# Patient Record
Sex: Female | Born: 1965 | Race: White | Hispanic: No | Smoking: Former smoker
Health system: Southern US, Community
[De-identification: ages and names within clinical notes are randomized; demographics above are authoritative.]

## PROBLEM LIST (undated history)

## (undated) DIAGNOSIS — F32A Depression, unspecified: Secondary | ICD-10-CM

## (undated) DIAGNOSIS — R519 Headache, unspecified: Secondary | ICD-10-CM

## (undated) DIAGNOSIS — R51 Headache: Secondary | ICD-10-CM

## (undated) DIAGNOSIS — K589 Irritable bowel syndrome without diarrhea: Secondary | ICD-10-CM

## (undated) DIAGNOSIS — E7800 Pure hypercholesterolemia, unspecified: Secondary | ICD-10-CM

## (undated) DIAGNOSIS — M1990 Unspecified osteoarthritis, unspecified site: Secondary | ICD-10-CM

## (undated) DIAGNOSIS — E039 Hypothyroidism, unspecified: Secondary | ICD-10-CM

## (undated) DIAGNOSIS — G8929 Other chronic pain: Secondary | ICD-10-CM

## (undated) DIAGNOSIS — E079 Disorder of thyroid, unspecified: Secondary | ICD-10-CM

## (undated) DIAGNOSIS — F419 Anxiety disorder, unspecified: Secondary | ICD-10-CM

## (undated) DIAGNOSIS — J45909 Unspecified asthma, uncomplicated: Secondary | ICD-10-CM

## (undated) DIAGNOSIS — J449 Chronic obstructive pulmonary disease, unspecified: Secondary | ICD-10-CM

## (undated) DIAGNOSIS — K760 Fatty (change of) liver, not elsewhere classified: Secondary | ICD-10-CM

## (undated) DIAGNOSIS — J439 Emphysema, unspecified: Secondary | ICD-10-CM

## (undated) DIAGNOSIS — G2581 Restless legs syndrome: Secondary | ICD-10-CM

## (undated) DIAGNOSIS — F329 Major depressive disorder, single episode, unspecified: Secondary | ICD-10-CM

## (undated) DIAGNOSIS — I10 Essential (primary) hypertension: Secondary | ICD-10-CM

## (undated) DIAGNOSIS — K219 Gastro-esophageal reflux disease without esophagitis: Secondary | ICD-10-CM

## (undated) DIAGNOSIS — M2550 Pain in unspecified joint: Secondary | ICD-10-CM

## (undated) DIAGNOSIS — M549 Dorsalgia, unspecified: Secondary | ICD-10-CM

## (undated) HISTORY — DX: Dorsalgia, unspecified: M54.9

## (undated) HISTORY — PX: APPENDECTOMY: SHX54

## (undated) HISTORY — DX: Headache: R51

## (undated) HISTORY — DX: Irritable bowel syndrome, unspecified: K58.9

## (undated) HISTORY — DX: Emphysema, unspecified: J43.9

## (undated) HISTORY — DX: Essential (primary) hypertension: I10

## (undated) HISTORY — DX: Other chronic pain: G89.29

## (undated) HISTORY — PX: BACK SURGERY: SHX140

## (undated) HISTORY — DX: Pain in unspecified joint: M25.50

## (undated) HISTORY — DX: Depression, unspecified: F32.A

## (undated) HISTORY — DX: Restless legs syndrome: G25.81

## (undated) HISTORY — DX: Chronic obstructive pulmonary disease, unspecified: J44.9

## (undated) HISTORY — DX: Disorder of thyroid, unspecified: E07.9

## (undated) HISTORY — PX: SHOULDER SURGERY: SHX246

## (undated) HISTORY — DX: Pure hypercholesterolemia, unspecified: E78.00

## (undated) HISTORY — DX: Headache, unspecified: R51.9

## (undated) HISTORY — DX: Unspecified asthma, uncomplicated: J45.909

## (undated) HISTORY — DX: Major depressive disorder, single episode, unspecified: F32.9

## (undated) HISTORY — DX: Fatty (change of) liver, not elsewhere classified: K76.0

---

## 1998-05-18 ENCOUNTER — Encounter: Payer: Self-pay | Admitting: Thoracic Surgery

## 1998-05-20 ENCOUNTER — Ambulatory Visit (HOSPITAL_COMMUNITY): Admission: RE | Admit: 1998-05-20 | Discharge: 1998-05-20 | Payer: Self-pay | Admitting: Thoracic Surgery

## 1998-06-02 ENCOUNTER — Emergency Department (HOSPITAL_COMMUNITY): Admission: EM | Admit: 1998-06-02 | Discharge: 1998-06-02 | Payer: Self-pay | Admitting: Emergency Medicine

## 1999-01-17 ENCOUNTER — Encounter: Admission: RE | Admit: 1999-01-17 | Discharge: 1999-01-17 | Payer: Self-pay | Admitting: *Deleted

## 1999-01-19 ENCOUNTER — Encounter: Admission: RE | Admit: 1999-01-19 | Discharge: 1999-01-19 | Payer: Self-pay | Admitting: *Deleted

## 1999-02-07 ENCOUNTER — Encounter: Payer: Self-pay | Admitting: Internal Medicine

## 1999-02-07 ENCOUNTER — Ambulatory Visit (HOSPITAL_COMMUNITY): Admission: RE | Admit: 1999-02-07 | Discharge: 1999-02-07 | Payer: Self-pay | Admitting: Internal Medicine

## 2000-08-13 ENCOUNTER — Ambulatory Visit (HOSPITAL_COMMUNITY): Admission: RE | Admit: 2000-08-13 | Discharge: 2000-08-13 | Payer: Self-pay | Admitting: Family Medicine

## 2000-08-13 ENCOUNTER — Encounter: Payer: Self-pay | Admitting: Family Medicine

## 2000-08-16 ENCOUNTER — Encounter: Payer: Self-pay | Admitting: Gastroenterology

## 2000-08-16 ENCOUNTER — Ambulatory Visit (HOSPITAL_COMMUNITY): Admission: RE | Admit: 2000-08-16 | Discharge: 2000-08-16 | Payer: Self-pay | Admitting: Gastroenterology

## 2000-08-18 ENCOUNTER — Emergency Department (HOSPITAL_COMMUNITY): Admission: EM | Admit: 2000-08-18 | Discharge: 2000-08-18 | Payer: Self-pay | Admitting: Emergency Medicine

## 2000-08-18 ENCOUNTER — Encounter: Payer: Self-pay | Admitting: Emergency Medicine

## 2000-08-27 ENCOUNTER — Ambulatory Visit (HOSPITAL_COMMUNITY): Admission: RE | Admit: 2000-08-27 | Discharge: 2000-08-27 | Payer: Self-pay | Admitting: Gastroenterology

## 2000-08-28 ENCOUNTER — Encounter: Payer: Self-pay | Admitting: Gastroenterology

## 2000-08-28 ENCOUNTER — Ambulatory Visit (HOSPITAL_COMMUNITY): Admission: RE | Admit: 2000-08-28 | Discharge: 2000-08-28 | Payer: Self-pay | Admitting: Gastroenterology

## 2000-08-29 ENCOUNTER — Encounter: Payer: Self-pay | Admitting: Orthopedic Surgery

## 2000-08-29 ENCOUNTER — Encounter (INDEPENDENT_AMBULATORY_CARE_PROVIDER_SITE_OTHER): Payer: Self-pay | Admitting: Specialist

## 2000-08-30 ENCOUNTER — Inpatient Hospital Stay (HOSPITAL_COMMUNITY): Admission: RE | Admit: 2000-08-30 | Discharge: 2000-08-31 | Payer: Self-pay | Admitting: Orthopedic Surgery

## 2000-09-20 ENCOUNTER — Other Ambulatory Visit: Admission: RE | Admit: 2000-09-20 | Discharge: 2000-09-20 | Payer: Self-pay | Admitting: Obstetrics and Gynecology

## 2000-09-30 ENCOUNTER — Encounter: Payer: Self-pay | Admitting: Obstetrics and Gynecology

## 2000-09-30 ENCOUNTER — Ambulatory Visit (HOSPITAL_COMMUNITY): Admission: RE | Admit: 2000-09-30 | Discharge: 2000-09-30 | Payer: Self-pay | Admitting: Obstetrics and Gynecology

## 2001-01-08 ENCOUNTER — Encounter: Payer: Self-pay | Admitting: Orthopedic Surgery

## 2001-01-08 ENCOUNTER — Encounter (INDEPENDENT_AMBULATORY_CARE_PROVIDER_SITE_OTHER): Payer: Self-pay | Admitting: Specialist

## 2001-01-09 ENCOUNTER — Inpatient Hospital Stay (HOSPITAL_COMMUNITY): Admission: RE | Admit: 2001-01-09 | Discharge: 2001-01-10 | Payer: Self-pay | Admitting: Orthopedic Surgery

## 2001-06-13 ENCOUNTER — Encounter: Admission: RE | Admit: 2001-06-13 | Discharge: 2001-06-13 | Payer: Self-pay | Admitting: Internal Medicine

## 2001-08-27 ENCOUNTER — Encounter: Payer: Self-pay | Admitting: Orthopaedic Surgery

## 2001-08-27 ENCOUNTER — Inpatient Hospital Stay (HOSPITAL_COMMUNITY): Admission: RE | Admit: 2001-08-27 | Discharge: 2001-08-31 | Payer: Self-pay | Admitting: Orthopaedic Surgery

## 2001-09-10 ENCOUNTER — Emergency Department (HOSPITAL_COMMUNITY): Admission: EM | Admit: 2001-09-10 | Discharge: 2001-09-10 | Payer: Self-pay | Admitting: Emergency Medicine

## 2001-11-10 ENCOUNTER — Encounter: Admission: RE | Admit: 2001-11-10 | Discharge: 2001-12-04 | Payer: Self-pay | Admitting: Orthopaedic Surgery

## 2002-07-31 ENCOUNTER — Encounter: Admission: RE | Admit: 2002-07-31 | Discharge: 2002-07-31 | Payer: Self-pay | Admitting: Orthopaedic Surgery

## 2002-07-31 ENCOUNTER — Encounter: Payer: Self-pay | Admitting: Orthopaedic Surgery

## 2003-11-22 ENCOUNTER — Encounter: Admission: RE | Admit: 2003-11-22 | Discharge: 2003-11-22 | Payer: Self-pay | Admitting: Allergy and Immunology

## 2003-12-20 ENCOUNTER — Other Ambulatory Visit: Admission: RE | Admit: 2003-12-20 | Discharge: 2003-12-20 | Payer: Self-pay | Admitting: Obstetrics and Gynecology

## 2004-03-22 ENCOUNTER — Ambulatory Visit: Payer: Self-pay | Admitting: Internal Medicine

## 2004-04-24 ENCOUNTER — Ambulatory Visit: Payer: Self-pay | Admitting: Internal Medicine

## 2004-06-21 ENCOUNTER — Observation Stay (HOSPITAL_COMMUNITY): Admission: EM | Admit: 2004-06-21 | Discharge: 2004-06-22 | Payer: Self-pay | Admitting: Emergency Medicine

## 2004-06-22 ENCOUNTER — Encounter (INDEPENDENT_AMBULATORY_CARE_PROVIDER_SITE_OTHER): Payer: Self-pay | Admitting: *Deleted

## 2004-06-30 ENCOUNTER — Ambulatory Visit: Payer: Self-pay | Admitting: Internal Medicine

## 2004-07-26 ENCOUNTER — Ambulatory Visit: Payer: Self-pay | Admitting: Internal Medicine

## 2004-11-09 ENCOUNTER — Ambulatory Visit: Payer: Self-pay | Admitting: Internal Medicine

## 2004-11-30 ENCOUNTER — Ambulatory Visit: Payer: Self-pay | Admitting: Internal Medicine

## 2004-12-21 ENCOUNTER — Other Ambulatory Visit: Admission: RE | Admit: 2004-12-21 | Discharge: 2004-12-21 | Payer: Self-pay | Admitting: Obstetrics and Gynecology

## 2005-02-02 ENCOUNTER — Ambulatory Visit: Payer: Self-pay | Admitting: Internal Medicine

## 2005-08-06 ENCOUNTER — Ambulatory Visit: Payer: Self-pay | Admitting: Internal Medicine

## 2005-08-09 ENCOUNTER — Ambulatory Visit: Payer: Self-pay | Admitting: Internal Medicine

## 2005-08-12 ENCOUNTER — Emergency Department (HOSPITAL_COMMUNITY): Admission: EM | Admit: 2005-08-12 | Discharge: 2005-08-13 | Payer: Self-pay | Admitting: Emergency Medicine

## 2005-08-15 ENCOUNTER — Ambulatory Visit: Payer: Self-pay | Admitting: Internal Medicine

## 2005-08-30 ENCOUNTER — Ambulatory Visit: Payer: Self-pay | Admitting: Internal Medicine

## 2005-11-27 ENCOUNTER — Encounter (INDEPENDENT_AMBULATORY_CARE_PROVIDER_SITE_OTHER): Payer: Self-pay | Admitting: *Deleted

## 2005-11-27 ENCOUNTER — Inpatient Hospital Stay (HOSPITAL_COMMUNITY): Admission: EM | Admit: 2005-11-27 | Discharge: 2005-11-28 | Payer: Self-pay | Admitting: Emergency Medicine

## 2006-01-01 ENCOUNTER — Other Ambulatory Visit: Admission: RE | Admit: 2006-01-01 | Discharge: 2006-01-01 | Payer: Self-pay | Admitting: Obstetrics & Gynecology

## 2006-01-18 ENCOUNTER — Ambulatory Visit: Payer: Self-pay | Admitting: Internal Medicine

## 2006-04-10 ENCOUNTER — Ambulatory Visit: Payer: Self-pay | Admitting: Internal Medicine

## 2006-05-03 ENCOUNTER — Ambulatory Visit: Payer: Self-pay | Admitting: Internal Medicine

## 2006-05-03 LAB — CONVERTED CEMR LAB: TSH: 4.98 microintl units/mL (ref 0.35–5.50)

## 2006-10-04 ENCOUNTER — Ambulatory Visit: Payer: Self-pay | Admitting: Internal Medicine

## 2006-10-04 LAB — CONVERTED CEMR LAB: TSH: 6.06 microintl units/mL — ABNORMAL HIGH (ref 0.35–5.50)

## 2006-11-01 DIAGNOSIS — K219 Gastro-esophageal reflux disease without esophagitis: Secondary | ICD-10-CM | POA: Insufficient documentation

## 2006-11-01 DIAGNOSIS — M5137 Other intervertebral disc degeneration, lumbosacral region: Secondary | ICD-10-CM | POA: Insufficient documentation

## 2006-11-01 DIAGNOSIS — M67919 Unspecified disorder of synovium and tendon, unspecified shoulder: Secondary | ICD-10-CM | POA: Insufficient documentation

## 2006-11-01 DIAGNOSIS — M5126 Other intervertebral disc displacement, lumbar region: Secondary | ICD-10-CM | POA: Insufficient documentation

## 2006-11-01 DIAGNOSIS — M719 Bursopathy, unspecified: Secondary | ICD-10-CM

## 2006-11-01 DIAGNOSIS — D869 Sarcoidosis, unspecified: Secondary | ICD-10-CM | POA: Insufficient documentation

## 2006-11-01 DIAGNOSIS — M51379 Other intervertebral disc degeneration, lumbosacral region without mention of lumbar back pain or lower extremity pain: Secondary | ICD-10-CM | POA: Insufficient documentation

## 2006-11-01 DIAGNOSIS — E039 Hypothyroidism, unspecified: Secondary | ICD-10-CM | POA: Insufficient documentation

## 2007-01-15 ENCOUNTER — Encounter: Payer: Self-pay | Admitting: Internal Medicine

## 2007-01-15 ENCOUNTER — Other Ambulatory Visit: Admission: RE | Admit: 2007-01-15 | Discharge: 2007-01-15 | Payer: Self-pay | Admitting: Obstetrics and Gynecology

## 2007-01-18 ENCOUNTER — Emergency Department (HOSPITAL_COMMUNITY): Admission: EM | Admit: 2007-01-18 | Discharge: 2007-01-18 | Payer: Self-pay | Admitting: Emergency Medicine

## 2007-01-28 ENCOUNTER — Encounter: Payer: Self-pay | Admitting: Internal Medicine

## 2007-01-28 ENCOUNTER — Ambulatory Visit: Payer: Self-pay | Admitting: Internal Medicine

## 2007-01-31 ENCOUNTER — Telehealth: Payer: Self-pay | Admitting: Internal Medicine

## 2007-02-04 ENCOUNTER — Telehealth: Payer: Self-pay | Admitting: Internal Medicine

## 2007-04-09 ENCOUNTER — Ambulatory Visit: Payer: Self-pay | Admitting: Internal Medicine

## 2007-04-09 LAB — CONVERTED CEMR LAB: TSH: 1.71 microintl units/mL (ref 0.35–5.50)

## 2007-04-14 ENCOUNTER — Telehealth: Payer: Self-pay | Admitting: Internal Medicine

## 2007-04-15 ENCOUNTER — Encounter: Payer: Self-pay | Admitting: Internal Medicine

## 2007-05-07 ENCOUNTER — Encounter: Payer: Self-pay | Admitting: Internal Medicine

## 2007-05-14 ENCOUNTER — Encounter: Payer: Self-pay | Admitting: Internal Medicine

## 2007-07-14 ENCOUNTER — Encounter: Payer: Self-pay | Admitting: Internal Medicine

## 2007-08-01 ENCOUNTER — Encounter: Admission: RE | Admit: 2007-08-01 | Discharge: 2007-08-01 | Payer: Self-pay | Admitting: Obstetrics & Gynecology

## 2007-08-12 ENCOUNTER — Encounter: Payer: Self-pay | Admitting: Internal Medicine

## 2007-08-22 ENCOUNTER — Encounter: Admission: RE | Admit: 2007-08-22 | Discharge: 2007-08-22 | Payer: Self-pay | Admitting: Family Medicine

## 2007-09-24 ENCOUNTER — Telehealth: Payer: Self-pay | Admitting: Internal Medicine

## 2007-10-16 ENCOUNTER — Encounter: Payer: Self-pay | Admitting: Internal Medicine

## 2007-11-26 ENCOUNTER — Telehealth: Payer: Self-pay | Admitting: Internal Medicine

## 2007-12-05 ENCOUNTER — Ambulatory Visit: Payer: Self-pay | Admitting: Internal Medicine

## 2007-12-05 ENCOUNTER — Telehealth: Payer: Self-pay | Admitting: Internal Medicine

## 2007-12-05 DIAGNOSIS — H1030 Unspecified acute conjunctivitis, unspecified eye: Secondary | ICD-10-CM | POA: Insufficient documentation

## 2007-12-05 LAB — CONVERTED CEMR LAB
BUN: 8 mg/dL (ref 6–23)
CO2: 27 meq/L (ref 19–32)
Calcium: 9.2 mg/dL (ref 8.4–10.5)
Chloride: 105 meq/L (ref 96–112)
Creatinine, Ser: 0.6 mg/dL (ref 0.4–1.2)
GFR calc Af Amer: 140 mL/min
GFR calc non Af Amer: 115 mL/min
Glucose, Bld: 87 mg/dL (ref 70–99)
Potassium: 3.6 meq/L (ref 3.5–5.1)
Sodium: 140 meq/L (ref 135–145)
TSH: 44.27 microintl units/mL — ABNORMAL HIGH (ref 0.35–5.50)

## 2007-12-08 ENCOUNTER — Encounter: Payer: Self-pay | Admitting: Internal Medicine

## 2007-12-09 ENCOUNTER — Encounter: Payer: Self-pay | Admitting: Internal Medicine

## 2007-12-11 ENCOUNTER — Ambulatory Visit: Payer: Self-pay | Admitting: Internal Medicine

## 2007-12-12 ENCOUNTER — Encounter: Admission: RE | Admit: 2007-12-12 | Discharge: 2007-12-12 | Payer: Self-pay | Admitting: Internal Medicine

## 2007-12-16 ENCOUNTER — Telehealth: Payer: Self-pay | Admitting: Internal Medicine

## 2007-12-16 ENCOUNTER — Encounter: Payer: Self-pay | Admitting: Internal Medicine

## 2007-12-18 ENCOUNTER — Observation Stay (HOSPITAL_COMMUNITY): Admission: EM | Admit: 2007-12-18 | Discharge: 2007-12-19 | Payer: Self-pay | Admitting: Emergency Medicine

## 2007-12-18 ENCOUNTER — Ambulatory Visit: Payer: Self-pay | Admitting: Internal Medicine

## 2007-12-18 ENCOUNTER — Encounter: Payer: Self-pay | Admitting: Internal Medicine

## 2007-12-18 ENCOUNTER — Ambulatory Visit: Payer: Self-pay | Admitting: Cardiology

## 2008-01-26 ENCOUNTER — Other Ambulatory Visit: Admission: RE | Admit: 2008-01-26 | Discharge: 2008-01-26 | Payer: Self-pay | Admitting: Obstetrics and Gynecology

## 2008-01-26 ENCOUNTER — Ambulatory Visit: Payer: Self-pay | Admitting: Internal Medicine

## 2008-01-29 ENCOUNTER — Telehealth: Payer: Self-pay | Admitting: Internal Medicine

## 2008-01-29 LAB — CONVERTED CEMR LAB
Free T4: 0.7 ng/dL (ref 0.6–1.6)
TSH: 0.4 microintl units/mL (ref 0.35–5.50)

## 2008-01-31 ENCOUNTER — Emergency Department (HOSPITAL_COMMUNITY): Admission: EM | Admit: 2008-01-31 | Discharge: 2008-01-31 | Payer: Self-pay | Admitting: Emergency Medicine

## 2008-02-26 ENCOUNTER — Encounter: Payer: Self-pay | Admitting: Internal Medicine

## 2008-03-08 ENCOUNTER — Encounter: Payer: Self-pay | Admitting: Internal Medicine

## 2008-04-09 HISTORY — PX: CHOLECYSTECTOMY: SHX55

## 2008-07-29 ENCOUNTER — Encounter (INDEPENDENT_AMBULATORY_CARE_PROVIDER_SITE_OTHER): Payer: Self-pay | Admitting: *Deleted

## 2008-12-03 ENCOUNTER — Encounter
Admission: RE | Admit: 2008-12-03 | Discharge: 2008-12-07 | Payer: Self-pay | Admitting: Physical Medicine & Rehabilitation

## 2008-12-07 ENCOUNTER — Ambulatory Visit: Payer: Self-pay | Admitting: Physical Medicine & Rehabilitation

## 2008-12-29 ENCOUNTER — Emergency Department (HOSPITAL_COMMUNITY): Admission: EM | Admit: 2008-12-29 | Discharge: 2008-12-29 | Payer: Self-pay | Admitting: Emergency Medicine

## 2008-12-31 ENCOUNTER — Encounter
Admission: RE | Admit: 2008-12-31 | Discharge: 2009-03-29 | Payer: Self-pay | Admitting: Physical Medicine & Rehabilitation

## 2009-01-20 ENCOUNTER — Ambulatory Visit (HOSPITAL_COMMUNITY)
Admission: RE | Admit: 2009-01-20 | Discharge: 2009-01-20 | Payer: Self-pay | Admitting: Physical Medicine & Rehabilitation

## 2009-01-20 ENCOUNTER — Ambulatory Visit: Payer: Self-pay | Admitting: Physical Medicine & Rehabilitation

## 2009-02-07 ENCOUNTER — Ambulatory Visit (HOSPITAL_COMMUNITY)
Admission: RE | Admit: 2009-02-07 | Discharge: 2009-02-07 | Payer: Self-pay | Admitting: Physical Medicine & Rehabilitation

## 2009-02-25 ENCOUNTER — Ambulatory Visit: Payer: Self-pay | Admitting: Physical Medicine & Rehabilitation

## 2009-03-29 ENCOUNTER — Encounter
Admission: RE | Admit: 2009-03-29 | Discharge: 2009-04-06 | Payer: Self-pay | Admitting: Physical Medicine & Rehabilitation

## 2009-03-31 ENCOUNTER — Ambulatory Visit: Payer: Self-pay | Admitting: Physical Medicine & Rehabilitation

## 2009-04-25 ENCOUNTER — Encounter: Payer: Self-pay | Admitting: Endocrinology

## 2009-04-27 ENCOUNTER — Encounter
Admission: RE | Admit: 2009-04-27 | Discharge: 2009-07-26 | Payer: Self-pay | Admitting: Physical Medicine & Rehabilitation

## 2009-04-28 ENCOUNTER — Ambulatory Visit: Payer: Self-pay | Admitting: Physical Medicine & Rehabilitation

## 2009-05-25 ENCOUNTER — Ambulatory Visit: Payer: Self-pay | Admitting: Physical Medicine & Rehabilitation

## 2009-06-29 ENCOUNTER — Encounter: Payer: Self-pay | Admitting: Endocrinology

## 2009-12-22 ENCOUNTER — Encounter: Payer: Self-pay | Admitting: Endocrinology

## 2010-03-01 ENCOUNTER — Encounter: Payer: Self-pay | Admitting: Endocrinology

## 2010-03-08 ENCOUNTER — Encounter: Payer: Self-pay | Admitting: Endocrinology

## 2010-03-10 ENCOUNTER — Ambulatory Visit: Payer: Self-pay | Admitting: Endocrinology

## 2010-03-10 DIAGNOSIS — J45909 Unspecified asthma, uncomplicated: Secondary | ICD-10-CM | POA: Insufficient documentation

## 2010-03-10 DIAGNOSIS — F329 Major depressive disorder, single episode, unspecified: Secondary | ICD-10-CM | POA: Insufficient documentation

## 2010-03-10 DIAGNOSIS — F17200 Nicotine dependence, unspecified, uncomplicated: Secondary | ICD-10-CM | POA: Insufficient documentation

## 2010-04-21 ENCOUNTER — Encounter: Payer: Self-pay | Admitting: Endocrinology

## 2010-04-29 ENCOUNTER — Encounter: Payer: Self-pay | Admitting: Obstetrics & Gynecology

## 2010-04-30 ENCOUNTER — Encounter: Payer: Self-pay | Admitting: Physical Medicine & Rehabilitation

## 2010-04-30 ENCOUNTER — Encounter: Payer: Self-pay | Admitting: Internal Medicine

## 2010-05-01 ENCOUNTER — Encounter: Payer: Self-pay | Admitting: Orthopaedic Surgery

## 2010-05-07 LAB — CONVERTED CEMR LAB
ALT: 43 units/L
AST: 27 units/L
Albumin: 4.3 g/dL
Alkaline Phosphatase: 138 units/L
BUN: 9 mg/dL
Basophils Relative: 0.1 %
CO2: 23 meq/L
Calcium: 9.5 mg/dL
Chloride: 106 meq/L
Creatinine, Ser: 0.73 mg/dL
Eosinophils Relative: 0.2 %
Free T4: 0.23 ng/dL
Free T4: 0.35 ng/dL
Free T4: 1.1 ng/dL
Free T4: 1.2 ng/dL
GFR calc Af Amer: 114 mL/min
GFR calc non Af Amer: 99 mL/min
Glucose, Bld: 107 mg/dL
HCT: 39.3 %
Hemoglobin: 13.3 g/dL
Lymphocytes, automated: 2.7 %
MCV: 92 fL
Monocytes Relative: 0.6 %
Neutrophils Relative %: 6.1 %
Platelets: 319 10*3/uL
Potassium: 4.7 meq/L
RBC: 4.26 M/uL
RDW: 13.7 %
Sodium: 143 meq/L
T3, Free: 50 pg/mL
T3, Free: 62 pg/mL
TSH: 30.46 microintl units/mL
TSH: 59.48 microintl units/mL
TSH: 60.06 microintl units/mL
TSH: 98.5 microintl units/mL
Total Bilirubin: 0.3 mg/dL
Total Protein: 7.1 g/dL
Vit D, 25-Hydroxy: 15.8 ng/mL
Vit D, 25-Hydroxy: 18 ng/mL
WBC: 9.7 10*3/uL

## 2010-05-09 NOTE — Progress Notes (Signed)
Summary: refill  Phone Note Refill Request Call back at [phone] Message from:  Patient on November 26, 2007 2:46 PM  Refills Requested: Medication #1:  AMITRIPTYLINE HCL 50 MG  TABS by mouth at bedtime Patient called requesting a 90day ([facility]) and 30day (local)  Initial call taken by: Rock Nephew CMA,  November 26, 2007 2:46 PM  Follow-up for Phone Call        LMOVM advising pt Follow-up by: Rock Nephew CMA,  November 26, 2007 2:53 PM      Prescriptions: AMITRIPTYLINE HCL 50 MG  TABS (AMITRIPTYLINE HCL) by mouth at bedtime  #30 x 1   Entered by:   Rock Nephew CMA   Authorized by:   Jacques Navy MD   Signed by:   Rock Nephew CMA on 11/26/2007   Method used:   Faxed to ...       [facility]  [address] [phone]*       [address] [place], [place]  [address]       Ph: [phone] or [phone]       Fax: [phone]   RxID:   [id number] AMITRIPTYLINE HCL 50 MG  TABS (AMITRIPTYLINE HCL) by mouth at bedtime  #90 x 3   Entered by:   Rock Nephew CMA   Authorized by:   Jacques Navy MD   Signed by:   Rock Nephew CMA on 11/26/2007   Method used:   Faxed to ...       [facility]*       [address] [address]       [place], [place]  [id number]       Ph: [phone]       Fax: [phone]   RxID:   [id number]

## 2010-05-09 NOTE — Procedures (Signed)
Summary: ENDO report/[facility]  ENDO report/[facility]   Imported By: Lester Edna 03/29/2008 09:14:36  _____________________________________________________________________  External Attachment:    Type:   Image     Comment:   External Document

## 2010-05-09 NOTE — Progress Notes (Signed)
Summary: NEXIUM TO OMEPRAZOLE  Phone Note From Pharmacy   Summary of Call: [facility] came in requesting change from nexium to omeprazole. Dr signed ok to change. Initial call taken by: Lamar Sprinkles,  December 05, 2007 8:27 AM  Follow-up for Phone Call        Rx was 40 not 20 Follow-up by: Lamar Sprinkles,  December 05, 2007 8:29 AM    New/Updated Medications: OMEPRAZOLE 20 MG CPDR (OMEPRAZOLE) 1 qam OMEPRAZOLE 40 MG CPDR (OMEPRAZOLE) 1 qam   Prescriptions: OMEPRAZOLE 40 MG CPDR (OMEPRAZOLE) 1 qam  #90 x 3   Entered by:   Lamar Sprinkles   Authorized by:   Jacques Navy MD   Signed by:   Lamar Sprinkles on 12/05/2007   Method used:   Handwritten   RxID:   [id number] OMEPRAZOLE 20 MG CPDR (OMEPRAZOLE) 1 qam  #90 x 3   Entered by:   Lamar Sprinkles   Authorized by:   Jacques Navy MD   Signed by:   Lamar Sprinkles on 12/05/2007   Method used:   Handwritten   RxID:   [id number]

## 2010-05-09 NOTE — Progress Notes (Signed)
Summary: Results  Phone Note Call from Patient Call back at Home Phone [phone] Call back at [phone]   Summary of Call: Patient is requesting results of labs Initial call taken by: Lamar Sprinkles,  January 29, 2008 3:49 PM  Follow-up for Phone Call        called patient - gave her the results - nl range FT4 and TSH Follow-up by: Jacques Navy MD,  January 29, 2008 6:43 PM

## 2010-05-09 NOTE — Assessment & Plan Note (Signed)
Summary: FLU SHOT---MEN---STC  Nurse Visit   Vital Signs:  Patient Profile:   45 Years Old Female Height:     64 inches Temp:     97.0 degrees F oral  Vitals Entered By: Windell Norfolk (January 26, 2008 11:30 AM)                 Prior Medications: OMEPRAZOLE 40 MG CPDR (OMEPRAZOLE) 1 qam ORTHO-NOVUM 7/7/7 (28) 0.5/0.75/1-35 MG-MCG  TABS (NORETHIN-ETH ESTRAD TRIPHASIC) by mouth once daily FLUOXETINE HCL 40 MG  CAPS (FLUOXETINE HCL) 1 by mouth once daily LORAZEPAM 0.5 MG  TABS (LORAZEPAM) by mouth two times a day AMITRIPTYLINE HCL 50 MG  TABS (AMITRIPTYLINE HCL) by mouth at bedtime REQUIP 1 MG  TABS (ROPINIROLE HCL) 1 at bedtime LEVOTHYROXINE SODIUM 200 MCG TABS (LEVOTHYROXINE SODIUM) 1 by mouth once daily OXYCODONE HCL 15 MG TABS (OXYCODONE HCL) Take 1 tablet by mouth three times a day OPTIVAR 0.05 % SOLN (AZELASTINE HCL) 1 qtt to affected eye bid Current Allergies: ! AMOXICILLIN ! SULFA ! CODEINE ! * DILAUDID    Orders Added: 1)  Admin 1st Vaccine [[address]0471] 2)  Flu Vaccine 80yrs + [[address]]    Flu Vaccine Consent Questions     Do you have a history of severe allergic reactions to this vaccine? no    Any prior history of allergic reactions to egg and/or gelatin? no    Do you have a sensitivity to the preservative Thimersol? no    Do you have a past history of Guillan-Barre Syndrome? no    Do you currently have an acute febrile illness? no    Have you ever had a severe reaction to latex? no    Vaccine information given and explained to patient? yes    Are you currently pregnant? no    Lot Number:[id number]   Site Given  Left Deltoid IM] .opcflu

## 2010-05-09 NOTE — Progress Notes (Signed)
Summary: u/s  Phone Note Call from Patient   Summary of Call: Pt called about u/s results, informed pt per letter. Office note has little as to why u/s was ordered. What does pt need to do next? Should the inflammation resolve itself?  Initial call taken by: Lamar Sprinkles,  December 16, 2007 5:11 PM  Follow-up for Phone Call        It appears this is the note that I closed by mistake and it disappeared- I believe the problem at the time was elevated TSH at 44, adjustment in medication and U/S neck to look for abnormality. This most likely represents chronic thyroiditis and is a common cause of hypothyroidism. The treatment is thyroid replacement. Patient does need f/u lab: TSH, FT4  244.9. OV if she needs further information. Follow-up by: Jacques Navy MD,  December 17, 2007 5:13 AM  Additional Follow-up for Phone Call Additional follow up Details #1::        Pt informed  Additional Follow-up by: Lamar Sprinkles,  December 17, 2007 5:28 PM

## 2010-05-09 NOTE — Letter (Signed)
Summary: [facility]  [facility]   Imported By: Esmeralda Links D'jimraou 03/08/2008 15:07:51  _____________________________________________________________________  External Attachment:    Type:   Image     Comment:   External Document

## 2010-05-09 NOTE — Consult Note (Signed)
Summary: [facility]  [facility]   Imported By: Maryln Gottron 06/03/2007 12:44:29  _____________________________________________________________________  External Attachment:    Type:   Image     Comment:   External Document

## 2010-05-09 NOTE — Letter (Signed)
Summary: [facility]  [facility]   Imported By: Sherian Rein 03/14/2010 13:44:34  _____________________________________________________________________  External Attachment:    Type:   Image     Comment:   External Document

## 2010-05-09 NOTE — Letter (Signed)
   [facility] [address] [place], [place]  [address] Phone: [phone]      December 16, 2007   [facility] [address] [place], [place] [address]  RE:  LAB RESULTS  Dear  Ms. Sellinger,  The following is an interpretation of your most recent lab tests.  Please take note of any instructions provided or changes to medications that have resulted from your lab work.   Thyroid ulta-sound shows no nodule or suspicious area. There is the appearance of thyroiditis - inflammation.   Call or e-mail me if you have questions ([email or web address]).   Sincerely Yours,    Jacques Navy MD

## 2010-05-09 NOTE — Procedures (Signed)
Summary: [facility]  [facility]   Imported By: Lester Cody 03/29/2008 09:13:18  _____________________________________________________________________  External Attachment:    Type:   Image     Comment:   External Document

## 2010-05-09 NOTE — Progress Notes (Signed)
Summary: Mail Order Meds  Phone Note Refill Request   Refills Requested: Medication #1:  NEXIUM 40 MG  CPDR by mouth q AM  Medication #2:  FLUOXETINE HCL 40 MG  CAPS by mouth once daily  Medication #3:  REQUIP 1 MG  TABS at bedtime  Medication #4:  LEVOTHYROXINE SODIUM 175 MCG  TABS. Initial call taken by: Lamar Sprinkles,  September 24, 2007 8:49 AM  Follow-up for Phone Call        OK on all of her refills, 90 days supply is fine. Follow-up by: Jacques Navy MD,  September 25, 2007 8:41 AM    New/Updated Medications: NEXIUM 40 MG  CPDR (ESOMEPRAZOLE MAGNESIUM) 1 by mouth q AM FLUOXETINE HCL 40 MG  CAPS (FLUOXETINE HCL) 1 by mouth once daily REQUIP 1 MG  TABS (ROPINIROLE HCL) 1 at bedtime LEVOTHYROXINE SODIUM 175 MCG  TABS (LEVOTHYROXINE SODIUM) 1 once daily   Prescriptions: REQUIP 1 MG  TABS (ROPINIROLE HCL) 1 at bedtime  #90 x 3   Entered by:   Lamar Sprinkles   Authorized by:   Jacques Navy MD   Signed by:   Lamar Sprinkles on 09/25/2007   Method used:   Faxed to ...       [facility]       [address] [address]       [place], [place]  [id number]       Ph: [phone]       Fax: [phone]   RxID:   [id number] FLUOXETINE HCL 40 MG  CAPS (FLUOXETINE HCL) 1 by mouth once daily  #90 x 3   Entered by:   Lamar Sprinkles   Authorized by:   Jacques Navy MD   Signed by:   Lamar Sprinkles on 09/25/2007   Method used:   Faxed to ...       [facility]       [address] [address]       [place], [place]  [id number]       Ph: [phone]       Fax: [phone]   RxID:   [id number] NEXIUM 40 MG  CPDR (ESOMEPRAZOLE MAGNESIUM) 1 by mouth q AM  #90 x 3   Entered by:   Lamar Sprinkles   Authorized by:   Jacques Navy MD   Signed by:   Lamar Sprinkles on 09/25/2007   Method used:   Faxed to ...       [facility]       [address] [address]       [place], [place]  [id number]       Ph: [phone]       Fax: [phone]   RxID:   [id number] LEVOTHYROXINE SODIUM 175 MCG  TABS (LEVOTHYROXINE SODIUM) 1 once  daily  #90 x 3   Entered by:   Lamar Sprinkles   Authorized by:   Livingston Diones   Signed by:   Lamar Sprinkles on 09/25/2007   Method used:   Print then Give to Patient   RxID:   [id number]

## 2010-05-09 NOTE — Consult Note (Signed)
Summary: [facility]  [facility]   Imported By: Maryln Gottron 07/28/2007 12:50:24  _____________________________________________________________________  External Attachment:    Type:   Image     Comment:   External Document

## 2010-05-09 NOTE — Progress Notes (Signed)
Summary: [facility]  [facility]   Imported By: Esmeralda Links D'jimraou 08/26/2007 15:13:59  _____________________________________________________________________  External Attachment:    Type:   Image     Comment:   External Document

## 2010-05-09 NOTE — Letter (Signed)
   [facility] [address] [place], [place]  [address] Phone: [phone]      April 15, 2007   [facility] [address] [place], [place] [address]  RE:  LAB RESULTS  Dear  Ms. Peugh,  The following is an interpretation of your most recent lab tests.  Please take note of any instructions provided or changes to medications that have resulted from your lab work.  THYROID STUDIES:  Thyroid studies normal TSH: 1.71      Please provide [place] with a working telephone number. Thanks.   Sincerely Yours,    Jacques Navy MD

## 2010-05-09 NOTE — Consult Note (Signed)
Summary: ov note/[facility]  ov note/[facility]   Imported By: Lester Kranzburg 10/22/2007 11:07:15  _____________________________________________________________________  External Attachment:    Type:   Image     Comment:   External Document

## 2010-05-09 NOTE — Consult Note (Signed)
Summary: [facility]   [facility]   Imported By: Maryln Gottron 07/28/2007 12:48:40  _____________________________________________________________________  External Attachment:    Type:   Image     Comment:   External Document

## 2010-05-09 NOTE — Letter (Signed)
   [facility] [address] [place], [place]  [address] Phone: [phone]      December 09, 2007   [facility] [address] [place], [place] [address]  RE:  LAB RESULTS  Dear  Katherine Hayden,  The following is an interpretation of your most recent lab tests.  Please take note of any instructions provided or changes to medications that have resulted from your lab work.  ELECTROLYTES:  Good - no changes needed  KIDNEY FUNCTION TESTS:  Good - no changes needed  THYROID STUDIES:  Thyroid level too low TSH: 44.27     DIABETIC STUDIES:  Excellent - no changes needed Blood Glucose: 87     Thyroid function is too low with a TSH of 44.27. I would like to see you and exam your thyroid and discuss additional testing since there has been such a change in lab with no change in medication.  I look forward to seeing you soon.   Sincerely Yours,    Jacques Navy MD  Appended Document:  Mailed copy of letter to pt per MD.

## 2010-05-09 NOTE — Progress Notes (Signed)
Summary: CXR results  Phone Note Call from Patient   Summary of Call: Pt requesting CXR results from last visit, can not pull off phone tree. Also wants to tell Dr that she is still having the same problems. Initial call taken by: Lamar Sprinkles,  January 31, 2007 12:56 PM  Follow-up for Phone Call        chest x-ray was normal (reveived report in last nights stack of faxes).  if continued problems - followup OVnext week Follow-up by: Jacques Navy MD,  January 31, 2007 1:46 PM  Additional Follow-up for Phone Call Additional follow up Details #1::        Pt informed, 10/24 Additional Follow-up by: Lamar Sprinkles,  February 03, 2007 9:22 AM

## 2010-05-09 NOTE — Letter (Signed)
Summary: Primary Care Appointment Letter  [facility]  [address] [place], [place] [address]   Phone: [phone]  Fax: [phone]    07/29/2008 MRN: [id number]  Katherine Hayden [address] [place], [place]  [address]  Dear Ms. Anagnos,   Your Primary Care Physician Jacques Navy MD has indicated that:    _______it is time to schedule an appointment.    _______you missed your appointment on______ and need to call and          reschedule.    _______you need to have lab work done.    _______you need to schedule an appointment discuss lab or test results.    ___X____you need to call to reschedule your appointment that is                       scheduled on MAY 3RD AT 11:10 AM, DR NORINS WILL NOT BE IN THE OFFICE. _________.    Please call our office as soon as possible. Our phone number is [phone] OPTION#1. Please press option 1. Our office is open 8a-12noon and 1p-5p, Monday through Friday.     Thank you,    [facility]

## 2010-05-09 NOTE — Assessment & Plan Note (Signed)
Summary: PER FLAG/JESSICA-LOW THYROID FUNCTION --$50--PHONE--STC   Vital Signs:  Patient Profile:   45 Years Old Female Height:     64 inches Temp:     97.2 degrees F oral Pulse rate:   86 / minute BP sitting:   120 / 86  (left arm) Cuff size:   large  Vitals Entered By: Zackery Barefoot CMA (December 11, 2007 1:24 PM)                 Chief Complaint:  Follow up on thyroid.    Current Allergies: ! AMOXICILLIN ! SULFA ! CODEINE ! * DILAUDID        Complete Medication List: 1)  Omeprazole 40 Mg Cpdr (Omeprazole) .Marland Kitchen.. 1 qam 2)  Ortho-novum 7/7/7 (28) 0.5/0.75/1-35 Mg-mcg Tabs (Norethin-eth estrad triphasic) .... By mouth once daily 3)  Fluoxetine Hcl 40 Mg Caps (Fluoxetine hcl) .Marland Kitchen.. 1 by mouth once daily 4)  Lorazepam 0.5 Mg Tabs (Lorazepam) .... By mouth two times a day 5)  Amitriptyline Hcl 50 Mg Tabs (Amitriptyline hcl) .... By mouth at bedtime 6)  Requip 1 Mg Tabs (Ropinirole hcl) .Marland Kitchen.. 1 at bedtime 7)  Levothyroxine Sodium 200 Mcg Tabs (Levothyroxine sodium) .Marland Kitchen.. 1 by mouth once daily 8)  Oxycodone Hcl 15 Mg Tabs (Oxycodone hcl) .... Take 1 tablet by mouth three times a day 9)  Optivar 0.05 % Soln (Azelastine hcl) .Marland Kitchen.. 1 qtt to affected eye bid    Prescriptions: LEVOTHYROXINE SODIUM 200 MCG TABS (LEVOTHYROXINE SODIUM) 1 by mouth once daily  #30 x 1   Entered and Authorized by:   Jacques Navy MD   Signed by:   Jacques Navy MD on 12/11/2007   Method used:   Electronically to        [facility] [place] [address] [phone]* (retail)       [address] [place], [place]  [address]       Ph: [phone]       Fax: [phone]   RxID:   [id number]  ]

## 2010-05-09 NOTE — Assessment & Plan Note (Signed)
Summary: NEW ENDO CONSULT/ THYROID/ SECURE HORIZ AND MEDICAID/NWS   Vital Signs:  Patient profile:   45 year old female Height:      64 inches (162.56 cm) Weight:      195.25 pounds (88.75 kg) BMI:     33.64 O2 Sat:      97 % on Room air Temp:     98.0 degrees F (36.67 degrees C) oral Pulse rate:   75 / minute BP sitting:   124 / 98  (left arm) Cuff size:   large  Vitals Entered By: Brenton Grills CMA Duncan Dull) (March 10, 2010 3:53 PM)  O2 Flow:  Room air CC: New Endo Consult/Hyperthyroidism/Dr. Burdine/aj Is Patient Diabetic? No Comments Pt is no longer taking Omeprazole, Ortho Novem, Fluoxetine, Amitriptyline, Oxycodone, or Optivar solution   Referring Provider:  Quintin Alto MD Primary Provider:  Norins  CC:  New Endo Consult/Hyperthyroidism/Dr. Burdine/aj.  History of Present Illness: pt states approx 9 years h/o hypothyroidism.  she has been on synthroid since then.  tsh was normal in 2009, but has been elevated since then.  t4 levels have been normal or low.  she is unable to explain why it has become and stayed abnormal.  dr burdine increased the synthroid to 200 micrograms/day, x a few mos.  she says she never misses taking the synthroid.  her only non-perescription medication is "goody powder." symptomatically, pt states few mos of moderate hair loss on the head, and assoc muscle cramps  Current Medications (verified): 1)  Omeprazole 40 Mg Cpdr (Omeprazole) .Marland Kitchen.. 1 Qam 2)  Ortho-Novum 7/7/7 (28) 0.5/0.75/1-35 Mg-Mcg  Tabs (Norethin-Eth Estrad Triphasic) .... By Mouth Once Daily 3)  Fluoxetine Hcl 40 Mg  Caps (Fluoxetine Hcl) .Marland Kitchen.. 1 By Mouth Once Daily 4)  Lorazepam 0.5 Mg  Tabs (Lorazepam) .... By Mouth Two Times A Day 5)  Amitriptyline Hcl 50 Mg  Tabs (Amitriptyline Hcl) .... By Mouth At Bedtime 6)  Requip 2 Mg Tabs (Ropinirole Hcl) .Marland Kitchen.. 1 Tablet At Bedtime 7)  Levothyroxine Sodium 200 Mcg Tabs (Levothyroxine Sodium) .Marland Kitchen.. 1 By Mouth Once Daily 8)  Oxycodone Hcl 15  Mg Tabs (Oxycodone Hcl) .... Take 1 Tablet By Mouth Three Times A Day 9)  Optivar 0.05 % Soln (Azelastine Hcl) .Marland Kitchen.. 1 Qtt To Affected Eye Bid 10)  Seroquel 300 Mg Tabs (Quetiapine Fumarate) .Marland Kitchen.. 1 Tablet By Mouth At Bedtime 11)  Tramadol Hcl 50 Mg Tabs (Tramadol Hcl) .... 2 Tablets By Mouth Three Times A Day 12)  Nexium 40 Mg Cpdr (Esomeprazole Magnesium) .... 2 Tablets By Mouth Once Daily 13)  Azithromycin 250 Mg Tabs (Azithromycin) .... 6 Day Pack 14)  Tussionex Pennkinetic Er 10-8 Mg/74ml Lqcr (Hydrocod Polst-Chlorphen Polst) .Marland Kitchen.. 1 Teaspoon At Bedtime 15)  Proair Hfa 108 (90 Base) Mcg/act Aers (Albuterol Sulfate) .... Once Daily As Needed  Allergies (verified): 1)  ! Amoxicillin 2)  ! Sulfa 3)  ! Codeine 4)  ! 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 father- 64's: not know to her mother - 72: vertigo, back trouble, HTN,CAD,Lipids Neg- breast or cdon cancer; DM brother has hyperthyroidism  Social History: Reviewed history from 12/05/2007 and no changes required. HSG, married '84 - 55yrs, divorce; married '05 no children on  disability  Review of Systems  The patient denies fever.         denies weight gain, memory loss, numbness, blurry vision, myalgias, and syncope.  she says her depression is well-controlled.  she reports decreased libido dry skin, rhinorrhea, easy bruising, and constipation.  she has slight doe, with recent illness.   Physical Exam  General:  normal appearance.   Head:  head: no deformity eyes: no periorbital swelling, no proptosis external nose and ears  are normal mouth: no lesion seen Neck:  Supple without thyroid enlargement or tenderness.  Lungs:  Clear to auscultation bilaterally. Normal respiratory effort.  Heart:  Regular rate and rhythm without murmurs or gallops noted. Normal S1,S2.   Msk:  muscle bulk and strength are grossly normal.  no obvious joint swelling.  gait is normal and steady  Extremities:  no edema no deformity Neurologic:  cn 2-12 grossly intact.   readily moves all 4's.   sensation is intact to touch on the feet  Skin:  normal texture and temp.  no rash.  not diaphoretic  Cervical Nodes:  No significant adenopathy.  Psych:  Alert and cooperative; normal mood and affect; normal attention span and concentration.     Impression & Recommendations:  Problem # 1:  HYPOTHYROIDISM (ICD-244.9) noncompliance is the most common cause of this scenario, but pt insists this is not the case.   thyroid hormone resistance is excluded here, because of low t4 level, and because thyroid hormone resistance is an hereditary syndrome, and tsh was normal on synthroid a few years ago.  she is not on any medication that would inhibit thyroid hormone absorption (usually resin), so the best course of action seems to be a trial of increasing the dosage.    Problem # 2:  hair loss could be due to #1  Problem # 3:  DEPRESSION (ICD-311) could be exac by #1  Medications Added to Medication List This Visit: 1)  Requip 2 Mg Tabs (Ropinirole hcl) .Marland Kitchen.. 1 tablet at bedtime 2)  Seroquel 300 Mg Tabs (Quetiapine fumarate) .Marland Kitchen.. 1 tablet by mouth at bedtime 3)  Tramadol Hcl 50 Mg Tabs (Tramadol hcl) .... 2 tablets by mouth three times a day 4)  Nexium 40 Mg Cpdr (Esomeprazole magnesium) .... 2 tablets by mouth once daily 5)  Azithromycin 250 Mg Tabs (Azithromycin) .... 6 day pack 6)  Tussionex Pennkinetic Er 10-8 Mg/57ml Lqcr (Hydrocod polst-chlorphen polst) .Marland Kitchen.. 1 teaspoon at bedtime 7)  Proair Hfa 108 (90 Base) Mcg/act Aers (Albuterol sulfate)  .... Once daily as needed 8)  Levothyroxine Sodium 125 Mcg Tabs (Levothyroxine sodium) .... 2 tabs once daily 9)  Tsh  .... And free t4 242.9  Other Orders: Consultation Level IV ([address])  Patient Instructions: 1)  cc [facility] [place] 2)  increase levothyroxine to 2x125 micrograms/day. 3)  in early january, go to a lab in [place], for thyroid blood tests.  here is a prescription.  please ask that results be sent here. Prescriptions: TSH and free t4 242.9  #0 x 0   Entered and Authorized by:   Minus Breeding MD   Signed by:   Minus Breeding MD on 03/10/2010   Method used:   Print then Give to Patient   RxID:   [id number] LEVOTHYROXINE SODIUM 125 MCG TABS (LEVOTHYROXINE SODIUM) 2 tabs once daily  #60 x 2   Entered and Authorized by:   Minus Breeding MD   Signed by:   Minus Breeding  MD on 03/10/2010   Method used:   Electronically to        [facility]* (retail)       [address]       [place], [place]  [address]       Ph: [phone]       Fax:    RxID:   [id number]    Orders Added: 1)  Consultation Level IV [56433]

## 2010-05-09 NOTE — Progress Notes (Signed)
Summary: Phonetree request  Phone Note Call from Patient Call back at [phone]   Summary of Call: I spoke with pt regarding CXR, she really wants to hear you tell her that it was normal via phonetree. Has called multiple times. Thank you Initial call taken by: Lamar Sprinkles,  February 04, 2007 12:16 PM  Follow-up for Phone Call        To Dr Debby Bud, please AP Follow-up by: Tresa Garter MD,  February 04, 2007 4:54 PM  Additional Follow-up for Phone Call Additional follow up Details #1::        I will call the patient Additional Follow-up by: Jacques Navy MD,  February 04, 2007 5:24 PM    Additional Follow-up for Phone Call Additional follow up Details #2::    Dr Debby Bud left mess on phone tree Follow-up by: Lamar Sprinkles,  February 04, 2007 5:27 PM

## 2010-05-09 NOTE — Assessment & Plan Note (Signed)
Summary: eye infection??SD   Vital Signs:  Patient Profile:   45 Years Old Female Height:     64 inches Weight:      202 pounds Temp:     98.5 degrees F oral Pulse rate:   70 / minute BP sitting:   140 / 92  (left arm) Cuff size:   large  Vitals Entered By: Zackery Barefoot CMA (December 05, 2007 3:46 PM)                 PCP:  Norins  Chief Complaint:  Right eye itching, burning, and with discharge x days/hair loss & weakness/back surgery Sept 29th.  History of Present Illness: Presents today for itchy burning eye. Pharmacist thought it was conjunctivitis and suggested doctor visit.    Updated Prior Medication List: OMEPRAZOLE 40 MG CPDR (OMEPRAZOLE) 1 qam ORTHO-NOVUM 7/7/7 (28) 0.5/0.75/1-35 MG-MCG  TABS (NORETHIN-ETH ESTRAD TRIPHASIC) by mouth once daily FLUOXETINE HCL 40 MG  CAPS (FLUOXETINE HCL) 1 by mouth once daily LORAZEPAM 0.5 MG  TABS (LORAZEPAM) by mouth two times a day AMITRIPTYLINE HCL 50 MG  TABS (AMITRIPTYLINE HCL) by mouth at bedtime REQUIP 1 MG  TABS (ROPINIROLE HCL) 1 at bedtime LEVOTHYROXINE SODIUM 175 MCG  TABS (LEVOTHYROXINE SODIUM) 1 once daily OXYCODONE HCL 15 MG TABS (OXYCODONE HCL) Take 1 tablet by mouth three times a day  Current Allergies: ! AMOXICILLIN ! SULFA ! CODEINE ! * DILAUDID  Past Medical History:    Reviewed history from 01/28/2007 and no changes required:       GERD       Hypothyroidism       sarcoid       bursitis Right shoulder  Past Surgical History:    Reviewed history from 01/28/2007 and no changes required:       left elbow surgery remote       Appendectomy       Cholecystectomy-laproscopic '07   Family History:    father- 53's: not know to her    mother - 61: vertigo, back trouble, HTN,CAD,Lipids    Neg- breast or cdon cancer; DM  Social History:    HSG,    married '84 - 18yrs, divorce; married '05    no children    work: Designer, television/film set, on disability    Review of Systems  The patient denies  anorexia, fever, weight loss, weight gain, decreased hearing, hoarseness, chest pain, syncope, and abdominal pain.     Physical Exam  General:     overwieght white female Eyes:     bulbar conjunctiva clear without erythema, lens clear, medial canthus normal.    Impression & Recommendations:  Problem # 1:  CONJUNCTIVITIS, ACUTE (ICD-372.00) no evidence of bacterial infection, e.g. redness, pus. Suspect viral vs allergic conjunctivitis  Plan: optivar 0.05% 1 qtt to eye two times a day.  Problem # 2:  HYPOTHYROIDISM (ICD-244.9) Due for follow -up lab  Plan: tsh.  Her updated medication list for this problem includes:    Levothyroxine Sodium 175 Mcg Tabs (Levothyroxine sodium) .Marland Kitchen... 1 once daily  Orders: TLB-TSH (Thyroid Stimulating Hormone) (84443-TSH)   Complete Medication List: 1)  Omeprazole 40 Mg Cpdr (Omeprazole) .Marland Kitchen.. 1 qam 2)  Ortho-novum 7/7/7 (28) 0.5/0.75/1-35 Mg-mcg Tabs (Norethin-eth estrad triphasic) .... By mouth once daily 3)  Fluoxetine Hcl 40 Mg Caps (Fluoxetine hcl) .Marland Kitchen.. 1 by mouth once daily 4)  Lorazepam 0.5 Mg Tabs (Lorazepam) .... By mouth two times a day 5)  Amitriptyline Hcl 50 Mg  Tabs (Amitriptyline hcl) .... By mouth at bedtime 6)  Requip 1 Mg Tabs (Ropinirole hcl) .Marland Kitchen.. 1 at bedtime 7)  Levothyroxine Sodium 175 Mcg Tabs (Levothyroxine sodium) .Marland Kitchen.. 1 once daily 8)  Oxycodone Hcl 15 Mg Tabs (Oxycodone hcl) .... Take 1 tablet by mouth three times a day 9)  Optivar 0.05 % Soln (Azelastine hcl) .Marland Kitchen.. 1 qtt to affected eye bid  Other Orders: TLB-BMP (Basic Metabolic Panel-BMET) (80048-METABOL)   Patient Instructions: 1)  viral vs allergic conjunctivitis. NO evidence of bacterial infection. Plan: warm compresses; optivar drop to the affected eye twice a day. Call for redness, thick exudate, any change in vision or increased pain. 2)  Hypothyroid - lab today. I will send you a letter with the results.   Prescriptions: OPTIVAR 0.05 % SOLN (AZELASTINE  HCL) 1 qtt to affected eye bid  #10 x 0   Entered and Authorized by:   Jacques Navy MD   Signed by:   Jacques Navy MD on 12/05/2007   Method used:   Print then Give to Patient   RxID:   [id number]  ]

## 2010-05-09 NOTE — Consult Note (Signed)
Summary: [facility]  [facility]   Imported By: Esmeralda Links D'jimraou 06/13/2007 11:22:46  _____________________________________________________________________  External Attachment:    Type:   Image     Comment:   External Document

## 2010-05-09 NOTE — Progress Notes (Signed)
Summary: Lab results  ---- Converted from flag ---- ---- 04/12/2007 11:04 AM, Jacques Navy MD wrote: callpatient: TSH is 1.71 and normal. Continue present medication dose. Thanks ------------------------------  Called all #'s in IDX all are wrong #'s for this pt.              ..................................................................Marland KitchenZackery Barefoot CMA  April 14, 2007 6:29 PM       Additional Follow-up for Phone Call Additional follow up Details #2::    I'll mail a note. Follow-up by: Jacques Navy MD,  April 15, 2007 7:23 AM

## 2010-05-09 NOTE — Assessment & Plan Note (Signed)
Medications Added LEVOTHYROXINE SODIUM 125 MCG TABS (LEVOTHYROXINE SODIUM)  LEVOTHYROXINE SODIUM 175 MCG  TABS (LEVOTHYROXINE SODIUM)         Vital Signs:  Patient Profile:   45 Years Old Female Height:     64 inches Weight:      191 pounds Temp:     99.1 degrees F oral Pulse rate:   96 / minute BP sitting:   118 / 83  (left arm)                 PCP:  Norins   History of Present Illness: 1. Thyroid - gyn found a TSH 17.5. No medication started.  2. chest pain -right breast, feels like heaviness. Concerned about recurrent sarcoid. Increased SOB. No cough, limited in stair climbing.   Current Allergies (reviewed today): ! AMOXICILLIN ! SULFA ! CODEINE ! * DILAUDID  Past Medical History:    GERD    Hypothyroidism    sarcoid    bursitis Right shoulder  Past Surgical History:    left elbow surgery remote    Appendectomy    Cholecystectomy-laproscopic '07     Review of Systems       The patient complains of weight loss, chest pain, and unusual weight change.  The patient denies anorexia, fever, vision loss, decreased hearing, hoarseness, syncope, dyspnea on exhertion, peripheral edema, prolonged cough, hemoptysis, abdominal pain, melena, hematochezia, severe indigestion/heartburn, hematuria, incontinence, genital sores, muscle weakness, suspicious skin lesions, transient blindness, difficulty walking, depression, abnormal bleeding, enlarged lymph nodes, angioedema, and breast masses.         weight loss of 20 lbs since May '07   Physical Exam  General:     Well-developed,well-nourished,in no acute distress; alert,appropriate and cooperative throughout examination Lungs:     Normal respiratory effort, chest expands symmetrically. Lungs are clear to auscultation, no crackles or wheezes. Heart:     Normal rate and regular rhythm. S1 and S2 normal without gallop, murmur, click, rub or other extra sounds. Skin:     Intact without suspicious lesions or  rashes Psych:     Cognition and judgment appear intact. Alert and cooperative with normal attention span and concentration. No apparent delusions, illusions, hallucinations    Impression & Recommendations:  Problem # 1:  HYPOTHYROIDISM (ICD-244.9)  Her updated medication list for this problem includes:    Synthroid 175 Mcg Tabs (Levothyroxine sodium) ..... Once daily    Levothyroxine Sodium 175 Mcg Tabs (Levothyroxine sodium)  TSH at Gyn 's was a little high. Patient's last Rx was , instead of !!  Plan: rewrote Rx for 175 micrograms. Lab follow-up 4-6weeks   Problem # 2:  SARCOIDOSIS (ICD-135) patient reports increased SOB and heaviness in right chest.  Plan: PA&Lat CXR, if abnormal ACE level  Complete Medication List: 1)  Nexium 40 Mg Cpdr (Esomeprazole magnesium) .... By mouth q am 2)  Ortho-novum 7/7/7 (28) 0.5/0.75/1-35 Mg-mcg Tabs (Norethin-eth estrad triphasic) .... By mouth once daily 3)  Fluoxetine Hcl 40 Mg Caps (Fluoxetine hcl) .... By mouth once daily 4)  Lorazepam 0.5 Mg Tabs (Lorazepam) .... By mouth two times a day 5)  Amitriptyline Hcl 50 Mg Tabs (Amitriptyline hcl) .... By mouth at bedtime 6)  Requip 1 Mg Tabs (Ropinirole hcl) .... At bedtime 7)  Synthroid 175 Mcg Tabs (Levothyroxine sodium) .... Once daily 8)  Levothyroxine Sodium 175 Mcg Tabs (Levothyroxine sodium)   Patient Instructions: 1)  lab work in 4 weeks. 2)  Phone tree for x-ray results.    ]

## 2010-05-09 NOTE — Medication Information (Signed)
Summary: Nexium(gen)/[facility] [facility]  Nexium(gen)/[facility] [facility]   Imported By: Lester Cameron 12/16/2007 10:30:47  _____________________________________________________________________  External Attachment:    Type:   Image     Comment:   External Document

## 2010-08-09 IMAGING — CR DG LUMBAR SPINE COMPLETE 4+V
5 series · 5 of 5 positions shown · non-contrast
Comparison: Lumbar spine 01/18/2007

CLINICAL DATA: Back fusion 3 weeks ago.  Blisters in area.  The
patient feels shaking and faint.

LUMBAR SPINE - COMPLETE 4+ VIEW

[t l-spine a.p. *]
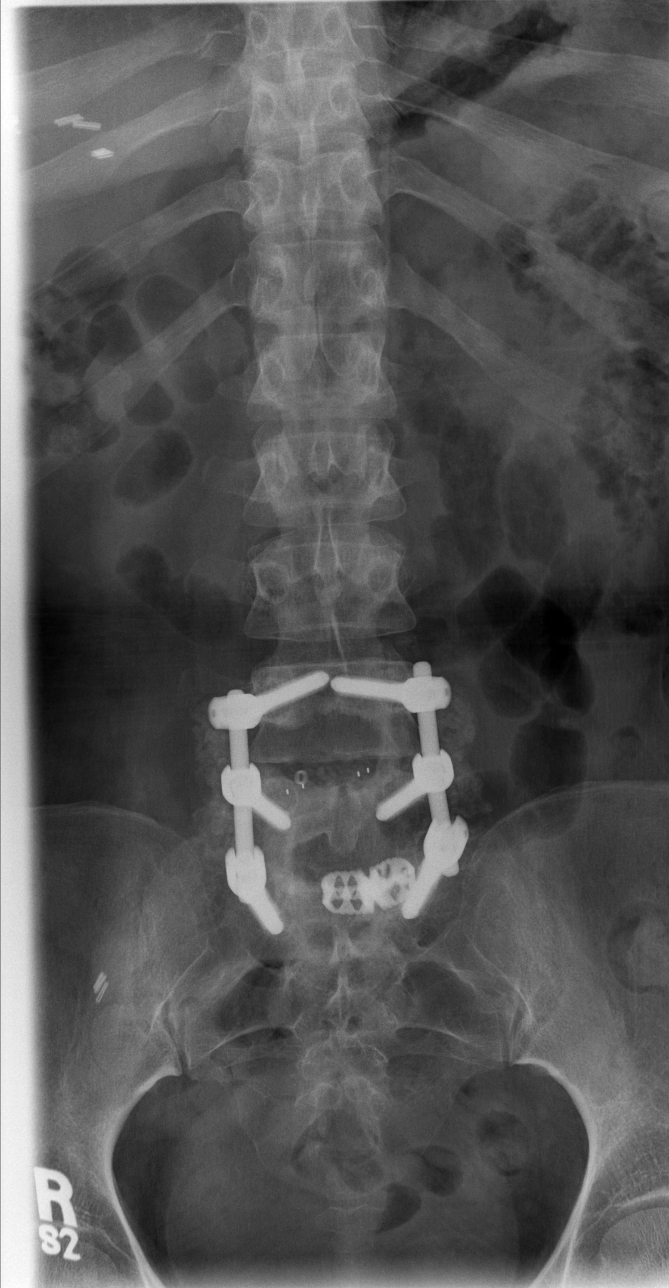

[t l-spine oblique exposure (1 of 2)]
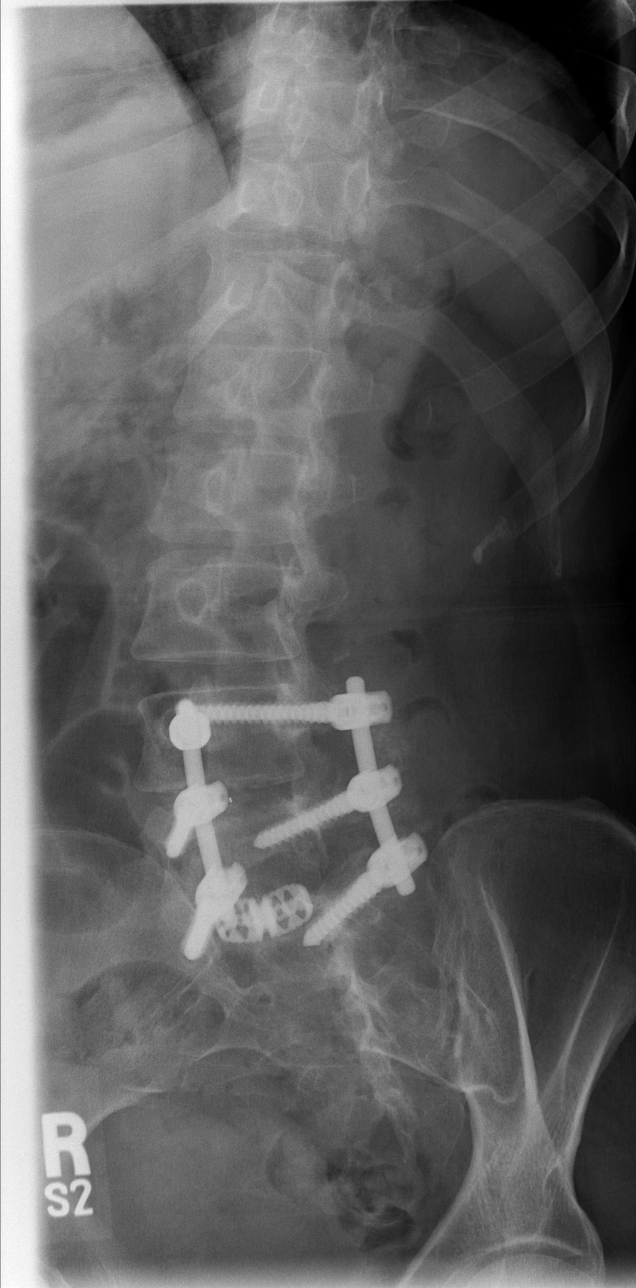

[t l-spine oblique exposure (2 of 2)]
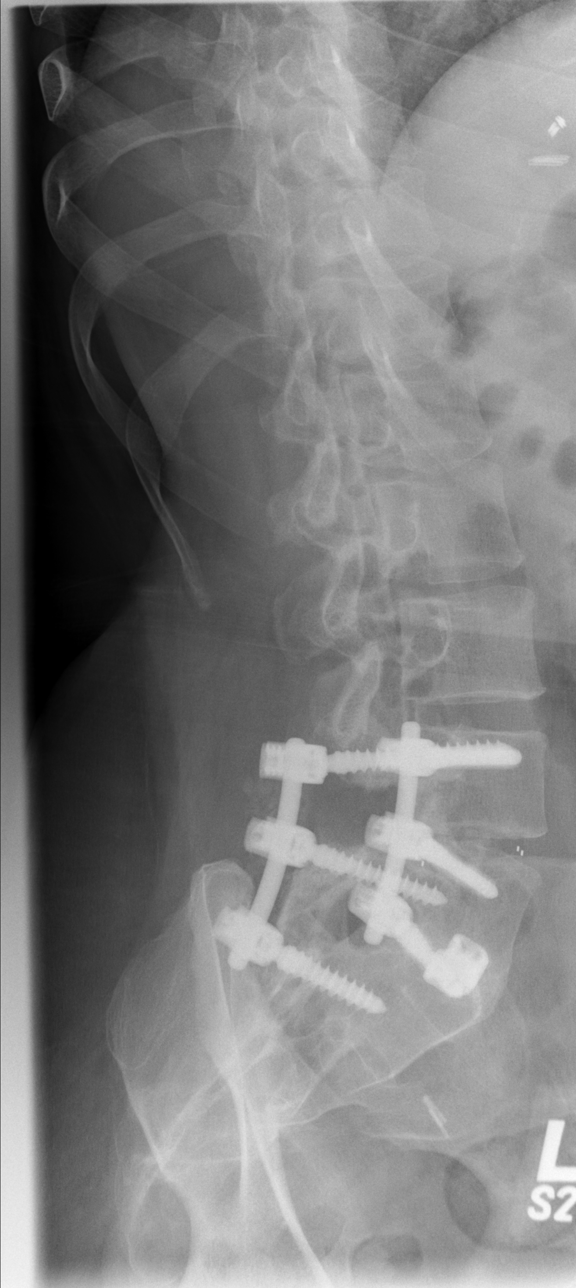

[t l-spine lat]
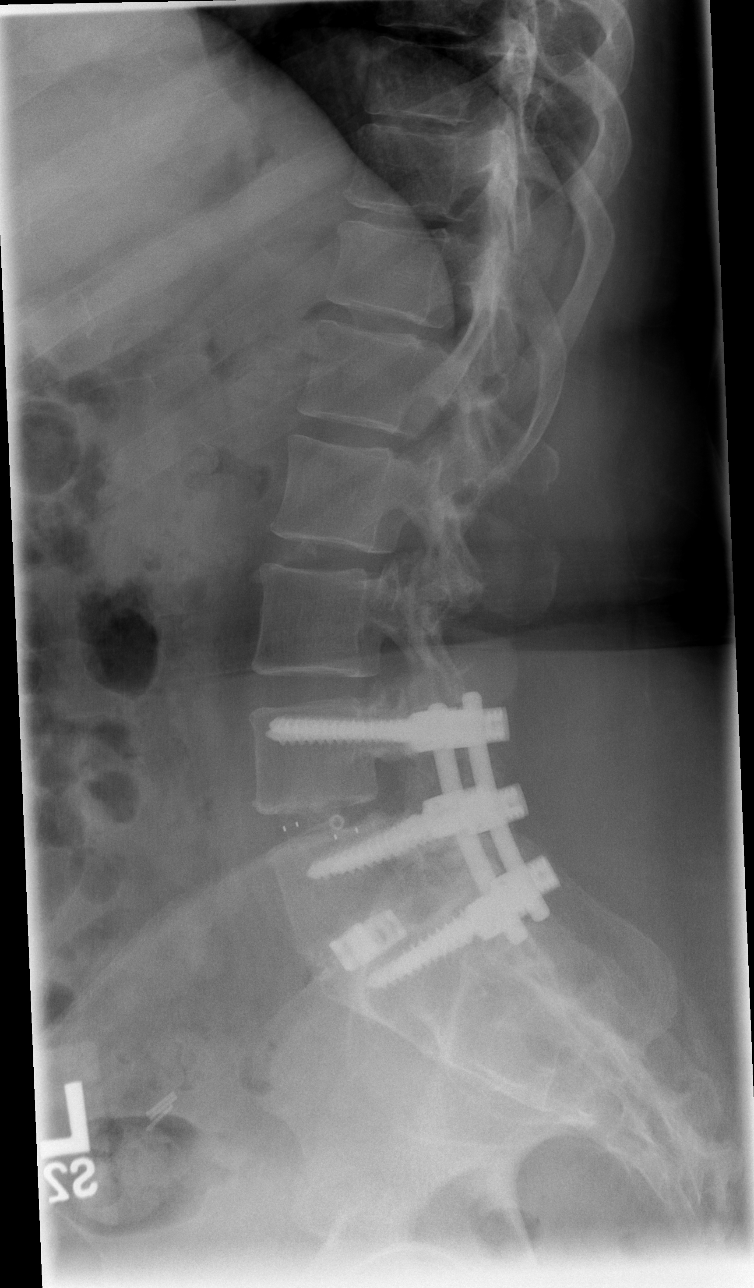

[t l-spine l5-s1 spot]
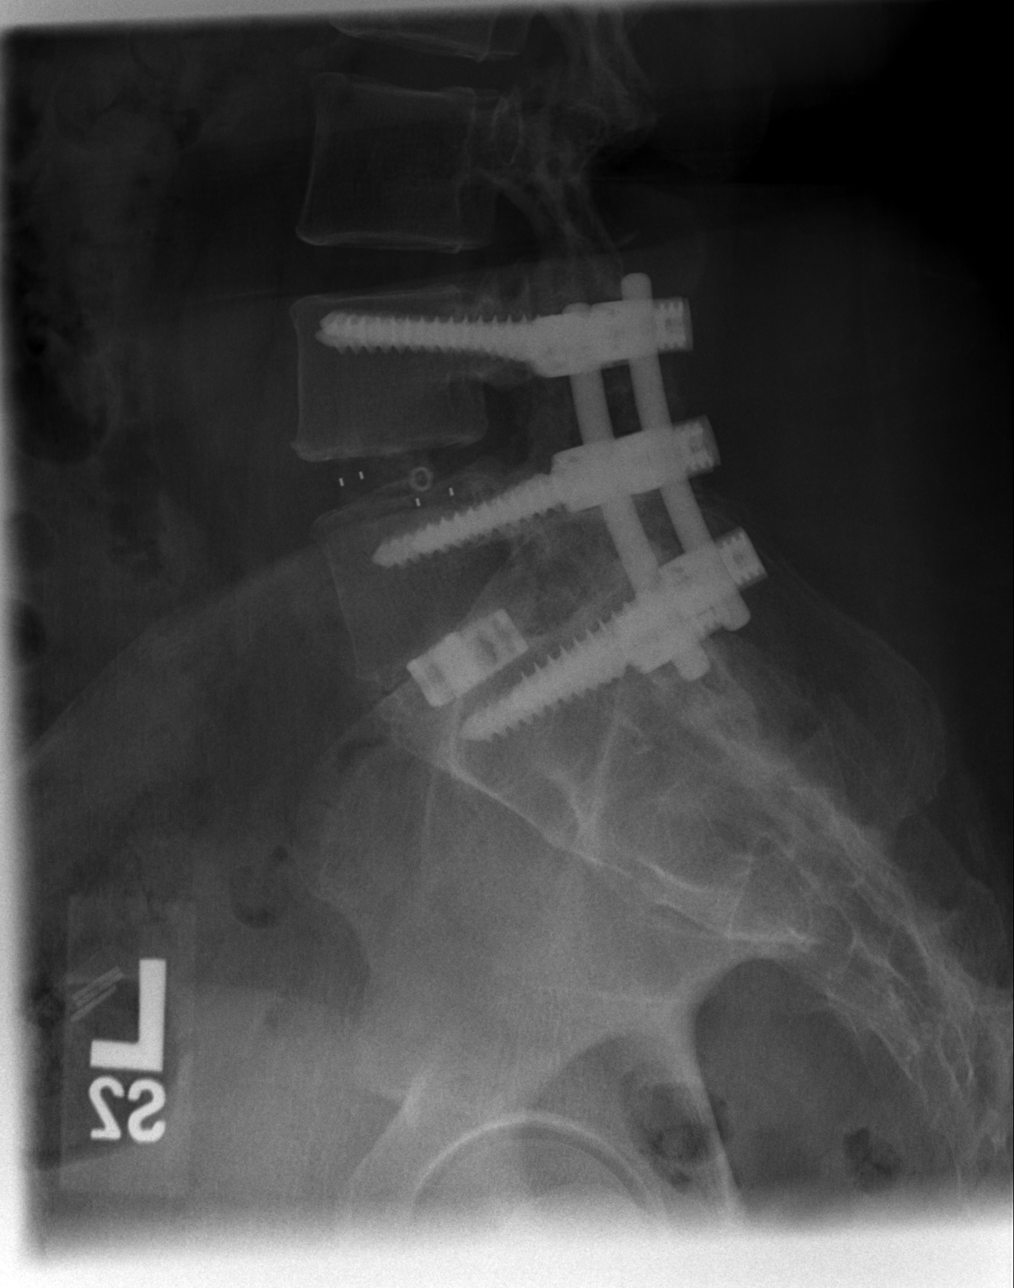

[5 of 5 positions shown; findings below may reference images not displayed]

FINDINGS: Posterior lumbar interbody fusion is present from L4-S1.
Compared to the prior exam, this is that extended.  L4-L5
discectomy.  Vertebral body height preserved.  Minimal disc space
narrowing L2-L3 with anterior osteophytes.  No fracture.  No
hardware disconnection or evidence of loosening. Posterolateral
bone graft material is present.
IMPRESSION: Extension of posterior lumbar interbody fusion to include L4-S1
without hardware complication or evidence of loosening of hardware.

## 2010-08-22 NOTE — Group Therapy Note (Signed)
The patient referred by Dr. Noel Gerold.  Katherine Hayden is a 45 year old female  who has had prior history of L5-S1 fusion, but developed pain after a  fusion somewhere in 2009.  She had a job-related injury in 2002 and has  had pain since that time.  She has been followed by Dr. Danne Harbor.  She  has had further diagnostic workup including a lumbar spine.  The  diskogram showed concordant pain at L4-5 disk.  She underwent L4-5  laminectomy and fusion on January 06, 2008.  She has been on long-term  narcotic analgesics, oxycodone 15 mg about 90-100 tablets per month, was  being filled by Dr. Ethelene Hal.  After her fusion healed at the L4-5 level,  she continued to have pain in the lumbar region.  More recently she has  developed some neck pain as well.  Her pain is graded as 6/10.  Interferes with activity and relationships with other people at 8/10.  Pain is sharp and stabbing.  Sleep is poor.  Pain is worse in morning  and nighttime hours.  Pain is worse with sitting, standing.  Improves  with heat, medication.  She can walk 20-25 minutes at a time.  She is  independent with all her self-care mobility, has some difficulty with  certain household duties due to her pain.  She has bladder control  problems, confusion, depression, anxiety, spasms, abdominal pain,  diarrhea, poor appetite.   PAST SURGICAL HISTORY:  In addition to above, has had cholecystectomy,  appendectomy, right shoulder surgery, right heel surgery.   PAST MEDICAL HISTORY:  Hypothyroidism.  She also has GERD and  depression.   SOCIAL HISTORY:  She is a smoker.   FAMILY HISTORY:  Positive for high blood pressure and disability.   Examination; blood pressure 151/93, pulse 83, respirations 20, O2 sat  98% on room air.  Well-developed, well-nourished female in no acute  stress.  Orientation x3.  Affect is alert.  Gait is with shuffle, but no  evidence of toe drag or knee instability.  She has normal coordination  in upper and  lower extremities.  Her neck has some moderate tenderness  to palpation in the right upper trap and somewhat in the scalene  musculature.  She has diminished range of motion in her cervical spine,  75% range with forward flexion, extension, lateral rotation and bending.  Upper extremity strength and sensation normal.  Deep tendon reflexes  normal.  Lower extremity strength and sensation are normal.  Deep tendon  reflexes are normal.  Her back has some tenderness to palpation in the  lumbar paraspinal muscles, this is mainly in the lumbosacral junction,  but lesser amounts of pain in the area between L2 and L4.   IMPRESSION:  1. Lumbar post laminectomy syndrome.  She has chronic postoperative      pain, but has had chronic pain really since 2002.  She may be      having some adjacent level of facet pain, medial branch blocks may      help further diagnosis.  2. Cervicalgia.  Appears to be myofascial mainly, no radicular      component, sent her to physical therapy for this and gave her some      Voltaren gel.  I will see her back in 1 month and discuss medial      branch blocks at that time.      Katherine Hayden, M.D.  Electronically Signed     AEK/MedQ  D:  12/07/2008 13:54:07  T:  12/08/2008 02:55:50  Job #:  [id number]   cc:   Sharolyn Douglas, M.D.  Fax: [phone]   Richard D. Ethelene Hal, M.D.  Fax: [phone]

## 2010-08-22 NOTE — Discharge Summary (Signed)
NAME:  Katherine Hayden, Katherine Hayden             ACCOUNT NO.:  [id number]   MEDICAL RECORD NO.:  [id number]          Hayden TYPE:  OBV   LOCATION:  3703                         FACILITY:  [facility]   PHYSICIAN:  Valerie A. Felicity Coyer, MDDATE OF BIRTH:  December 21, 1965   DATE OF ADMISSION:  12/18/2007  DATE OF DISCHARGE:  12/19/2007                               DISCHARGE SUMMARY   DISCHARGE DIAGNOSES:  1. Atypical chest pain.  2. Hypothyroid with elevated TSH and recent increase in home Synthroid      dose.  3. History of sarcoidosis.  4. Anxiety/depression.  5. Gastroesophageal reflux disease.  6. History of migraines.  7. History of vertigo.  8. History of degenerative disk disease at L5-S1.   COURSE OF HOSPITALIZATION:  1. Atypical chest pain.  Katherine Hayden was admitted.  She underwent      serial cardiac enzymes, which were negative.  Her D-dimer was noted      to be mildly elevated and she underwent a V/Q which was negative      for PE.  Chest x-ray showed no acute disease.  Cardiology did see      Katherine Hayden during this admission.  She was felt stable for      discharge to home with outpatient Myoview and it was felt that as      Myoview was negative that she would require no further cardiac      workup and it was felt that her pain might have been      musculoskeletal in nature.  2. Hypothyroid.  Katherine Hayden was noted to have an elevated TSH upon      review of centricity.  She was started on an increased dose of her      Synthroid 1 week ago.  Therefore, we will not suggest any further      at this time.  She will need outpatient TFTs in 4-6 weeks.   MEDICATIONS AT TIME OF DISCHARGE:  1. Omeprazole 40 mg p.o. daily.  2. Ortho-Novum 1 tablet daily.  3. Fluoxetine 40 mg p.o. daily.  4. Lorazepam 0.5 mg p.o. b.i.d.  5. Amitriptyline 50 mg p.o. nightly.  6. ReQuip 1 mg p.o. nightly.  7. Levothyroxine 200 mcg p.o. daily.  8. Oxycodone 15 mg p.o. t.i.d. as needed.  9. Optivar 0.05% solution 1  drop to Katherine affected eye twice daily.   PERTINENT LABORATORY DATA AT TIME OF DISCHARGE:  TSH 14.377.  V/Q  negative.  Hemoglobin 11.9 and hematocrit 34.6.  Cardiac enzymes  negative x3.  Total cholesterol 166.  HDL 39 and LDL 108.   FOLLOWUP:  Katherine Hayden will be arranged for an outpatient Myoview prior  to discharge.  She is also to follow up with Dr. Illene Regulus.   Greater than 30 minutes was spent discharge planning.      Sandford Craze, NP      Raenette Rover. Felicity Coyer, MD  Electronically Signed    MO/MEDQ  D:  12/19/2007  T:  12/19/2007  Job:  [id number]   cc:   Rosalyn Gess. Norins, MD

## 2010-08-22 NOTE — Consult Note (Signed)
[name], Hayden             ACCOUNT NO.:  [id number]   MEDICAL RECORD NO.:  [id number]          PATIENT TYPE:  INP   LOCATION:  3703                         FACILITY:  [facility]   PHYSICIAN:  Katherine Hayden. Katherine Hayden, Katherine Hayden, FACCDATE OF BIRTH:  Jul 21, 1965   DATE OF CONSULTATION:  12/18/2007  DATE OF DISCHARGE:                                 CONSULTATION   PRIMARY CARE PHYSICIAN:  Katherine Gess. Hayden, Katherine Hayden   PRIMARY CARDIOLOGIST:  Katherine Hayden. Katherine Hayden, Katherine Hayden, [facility]   CHIEF COMPLAINT:  Chest pain.   HISTORY OF PRESENT ILLNESS:  Katherine Hayden is a 45 year old female with  no previous history of coronary artery disease.  She had onset of  substernal and slightly to the left chest pain on December 16, 2007 p.m.  She describes it as a stinging and a burning pain as well as grabbing  pain.  She states that at times it is sharp and dull.  It increases with  deep inspiration and cough.  When she moves to the left, she feels like  it gets worse.  It does not radiate.  She has had some shortness of  breath and diaphoresis but denies nausea and vomiting.  She does not  remember trying any specific therapies.  When her symptoms did not  resolve, she came to the emergency room and was admitted by primary  care.  Cardiology was asked to consult because of multiple cardiac risk  factors.  Katherine Hayden states that she has never had this pain before  this week.  She also does no chest wall tenderness.   PAST MEDICAL HISTORY:  1. She has no history of diabetes, hypertension, hyperlipidemia, but      she does have a family history of premature coronary artery disease      as well as obesity with a body mass index of 33.3.  2. Vertigo  3. Migraines.  4,  History of anxiety and depression.  1. Hypothyroid.  2. Hiatal hernia/GERD.  3. Pulmonary sarcoid.  4. Degenerative joint disease.  5. Varicose veins.   SURGICAL HISTORY:  She is status post EGD and colonoscopy, back surgery  x3, right shoulder surgery,  appendectomy, cholecystectomy and foot  surgery.   ALLERGIES:  She is allergic or intolerant to AMOXICILLIN, SULFA,  CODEINE, VICODIN, DILAUDID and SHELLFISH.   CURRENT MEDICATIONS:  1. Synthroid 200 mcg daily.  2. Protonix 40 mg b.i.d.  3. IV heparin.  4. Oxycodone p.r.n.   FAMILY HISTORY:  According to previous notes, her father had a history  of coronary artery disease which the patient now denies.  Her brother  had his first MI in his 62s and died in his 52s of another MI.  Her  mother is alive with no history of coronary artery disease.   SOCIAL HISTORY:  She lives in [place] with her husband.  She is  disabled secondary to back problems.  She has no history of alcohol,  tobacco or drug abuse.   REVIEW OF SYSTEMS:  She has occasional cough.  She denies dysphagia.  Her reflux symptoms are fairly well controlled on the medication.  She  has not had any recent illnesses, fevers, or chills.  She has occasional  bright red blood per rectum but denies melena or hematochezia.  She has  had no hematuria.  Full 14-point review of systems is otherwise  negative.   PHYSICAL EXAMINATION:  VITAL SIGNS:  Temperature is 98.2, blood pressure  124/83, pulse 64, respiratory rate 20, O2 saturation 98% on room air.  GENERAL:  She is a well-developed, well-nourished white female in no  acute distress.  HEENT:  Normal.  NECK:  There is no lymphadenopathy, thyromegaly, bruit or JVD noted.  CVA:  The heart is regular in rate and rhythm with an S1-S2 and no  significant murmur, rub or gallop is noted.  ABDOMEN:  Soft and nontender with active bowel sounds.  MUSCULOSKELETAL:  There is no joint deformity or effusions and no spine  or CVA tenderness.  EXTREMITIES:  Distal pulses are intact in all four extremities with no  cyanosis, clubbing or edema noted.  NEURO:  She is alert and oriented.  Cranial nerves II-XII grossly  intact.  SKIN:  No rashes or lesions are noted.   CHEST X-RAY:  No  acute disease.   LABORATORY VALUES:  Hemoglobin 12.2, hematocrit 36.0, WBCs 10.9,  platelets 285, D-dimer 0.55, point-of-care markers negative x1.  CK-MB  and troponin negative x1.  Sodium 141, potassium 3.7, chloride 109, BUN  9, creatinine 0.9, glucose 97, total cholesterol 166, triglycerides 97,  HDL 39 and LDL 108.   IMPRESSION:  Katherine Hayden was seen today by Dr. Jens Hayden.  She has a  past medical history of sarcoid, hypothyroidism, and chronic back pain  who presents with chest pain.  She has had chest pain since Tuesday  evening December 16, 2007, and the pain has been continuous.  It is  substernal and does not radiate.  It is increased with activity,  inspiration, and cough.  She had some diaphoresis and shortness of  breath but no nausea and vomiting.  She has been admitted to rule out.  Initial enzymes are negative and there is no ST changes on her EKG.  Her  chest pain is atypical and chest wall tenderness may be musculoskeletal.  We will cycle enzymes and if negative an outpatient Myoview is  appropriate for further evaluation.  Consider treatment with  nonsteroidal antiinflammatory drugs but we will leave this to primary  care in the setting of the patient with significant reflux symptoms.  Pulmonary embolus is  doubtful but her D-dimer is elevated.  We will check a urine pregnancy  test prior to ordering a V/Q scan for further evaluation.  If her  cardiac enzymes remain negative and her V/Q is low probability, then all  further cardiac workup can be done as an outpatient.      Katherine Hayden, Katherine Hayden      Katherine Hayden. Katherine Hayden, Katherine Hayden, [facility]  Electronically Signed    RB/MEDQ  D:  12/18/2007  T:  12/19/2007  Job:  [id number]

## 2010-08-22 NOTE — H&P (Signed)
NAME:  Katherine Hayden, Katherine Hayden NO.:  [id number]   MEDICAL RECORD NO.:  [id number]          PATIENT TYPE:  EMS   LOCATION:  MAJO                         FACILITY:  [facility]   PHYSICIAN:  Michiel Cowboy, MDDATE OF BIRTH:  [birth year]   DATE OF ADMISSION:  12/18/2007  DATE OF DISCHARGE:                              HISTORY & PHYSICAL   PRIMARY CARE PHYSICIAN:  Rosalyn Gess. Norins, MD   CHIEF COMPLAINT:  Chest pain.   HISTORY OF PRESENT ILLNESS:  The patient is a 45 year old never-smoker  with family history of early coronary artery disease in a brother in mid  47s, presents with two-day history of chest pain and shortness of  breath, worse on exertion.  Currently, resolved after receiving  nitroglycerin.  Chest pain is currently gone.  The patient described it  as sharp or burning in the left side of her chest.  Started yesterday  and then went away when she fell asleep and resumed again in the  morning.  She was actually here in the emergency department because of  her husband who was concerned about DVT, but wanted to get herself  checked out as well.  She did not have any long car ride recently.  No  lower extremity edema.  The shortness of breath has been going on for a  couple of days with sudden onset.   REVIEW OF SYSTEMS:  No fevers, no chills, currently has headache.  Has  history of migraines and currently received nitroglycerin.  No nausea,  no vomiting.  No constipation, no diarrhea. Otherwise, review of systems  unremarkable except for as in HPI. Chronic back pain for which the  patient is going to undergo surgery.  Also, the patient did notice some  right arm transient weakness which she is not quite sure if it was true  weakness or not.  It did not bother patient much and she went to bed.  Currently, she feels like she is just weak all over, but nothing  localized.   PAST MEDICAL HISTORY:  1. History of migraines.  2. Sarcoidosis.  3. Anxiety.  4.  Depression.  5. GERD.  6. History of vertigo.  7. History of degenerative disk disease in L5-S1.  She is going to      undergo surgery soon.  8. History of hypothyroidism.   ALLERGIES:  AMOXICILLIN, DILAUDID, SULFA.   MEDICATIONS:  1. Nexium 40 mg p.o. daily.  2. Levoxyl 200 mg once a day.  3. Ortho-Novum and oxycodone as needed for pain.   SOCIAL HISTORY:  The patient never smoked, does not drink or use  alcohol.  Does not use drugs.  Lives at home with her husband, never had  kids.   FAMILY HISTORY:  Significant for brother with early MI and death from MI  in mid 25s.   PHYSICAL EXAMINATION:  VITAL SIGNS:  Temperature 98.6, blood pressure  124/68, pulse 84, respirations 18, saturating 97% on room air.  GENERAL:  The patient appears to be currently in no acute distress.  HEENT:  Head nontraumatic.  Mucous membranes moist.  NECK:  Supple.  No JVD.  HEART:  Regular rate and rhythm.  No murmurs, rubs or gallops.  LUNGS:  Clear to auscultation bilaterally.  ABDOMEN:  Soft, nontender, nondistended.  NEUROLOGICAL:  Cranial nerves II-XII intact.  Strength 5/5 in upper  extremities.  The patient is unable to lift up legs because of back  pain.  EXTREMITIES:  Lower extremities without edema.   LABORATORY DATA:  White blood cell count 10.9, hemoglobin 12.2, sodium  141, potassium 3.7, chloride 0.9.  Cardiac enzymes unremarkable.  EKG  showed normal sinus rhythm.  No evidence of ischemia infarction.  Chest  x-ray:  No cardiopulmonary disease noted.   ASSESSMENT/PLAN:  This is a 45 year old female with family history  significant for coronary artery disease with exertional chest pain which  is somewhat actually atypical in description, but is worrisome for  possible acute coronary syndrome.  1. Chest pain.  Since the patient does have significant risk factors,      we will cycle cardiac enzymes, actually go ahead and put her on      heparin drip.  Would recommend a Cardiology  consult in a.m.  Order      2D echo.  Will check a D. dimer if the patient having worsening      shortness of breath and a kind of sharp chest pain which is      persistent.  Further respiratory tests and lipid, hemoglobin A1c.  2. History of hypothyroidism.  Check TSH, continue Synthroid.  3. History of anxiety and depression.  Currently stable.  4. History of GERD.  Continue Protonix b.i.d.  5. Prophylaxis.  Patient given Protonix with heparin.   Dr. Felicity Coyer will assume care in the morning.      Michiel Cowboy, MD  Electronically Signed     AVD/MEDQ  D:  12/18/2007  T:  12/18/2007  Job:  [id number]   cc:   Rosalyn Gess. Norins, MD

## 2010-08-25 NOTE — Op Note (Signed)
[facility]  Patient:    Katherine Hayden, Katherine Hayden Visit Number: [id number] MRN: [id number]          Service Type: SUR Location: 1W 0165 01 Attending Physician:  Patricia Nettle Dictated by:   Patricia Nettle, M.D. Proc. Date: 08/27/01 Admit Date:  08/27/2001                             Operative Report  PREOPERATIVE DIAGNOSES: 1. Degenerative disk disease of L5-S1. 2. Lumbago. 3. Post laminectomy syndrome. 4. Hypothyroidism.  POSTOPERATIVE DIAGNOSES: 1. Degenerative disk disease of L5-S1. 2. Lumbago. 3. Post laminectomy syndrome. 4. Hypothyroidism.  PROCEDURE: 1. Revision of left L5-S1 laminotomy with lysis of adhesions and facetectomy. 2. Posterior spine arthrodesis of L5-S1. 3. Pedicle screw instrumentation of L5-S1. 4. Transforaminal lumbar anterior body fusion of L5-S1 with placement of a    Devix titanium interbody cage. 5. Intrathecal injection of long-acting preservative-free morphine. 6. Neuro monitoring utilizing the Nuvasive neuro-vision system.  SURGEON:  Patricia Nettle, M.D.  ASSISTANT:  Javier Docker, M.D.  ANESTHESIA:  General endotracheal.  COMPLICATIONS:  None.  ESTIMATED BLOOD LOSS:  500 cc with 200 cc replaced of Cell Saver.  INDICATIONS AND FINDINGS:  The patient is a 45 year old female who is status post microdiskectomy on the left at L5-S1 x2.  After the second surgery, she developed severe lower back pain that was unresponsive to conservative treatment.  In addition, she had pain in the left leg and S1 pattern.  She has tried epidural steroid injections, bracing, physical therapy, and narcotic analgesics.  At this point, she is completely miserable and does not feel that she can go on this way.  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  There are noted changes about the L5-S1 endplate.  After weighing the risks and benefits of revision, decompression, and fusion, she has elected to  proceed in hopes of improving her symptoms.  At surgery, there was extensive scarring about the L5 and S1 nerve roots.  A facetectomy was performed on the left at L5-S1 and the L5-S1 foramen was completely decompressed.  A lysis of adhesions was performed, bringing up the S1 nerve root out its foramen.  The disk material was extremely degenerative and the disk space collapsed.  We were able to distract the space up to 9 mm, placing a Devix titanium cage through a transforaminal approach.  The bony quality was good with excellent fixation of the pedicle screws.  DESCRIPTION OF PROCEDURE:  The patient was properly identified in the holding area and taken to the operating room.  She underwent general endotracheal anesthesia without difficulty.  She was given 1 g of vancomycin as prophylaxis.  EMG leads were placed and connected to the neuro-vision machine to monitor both continuous and trigger EMGs during the procedure.  She was turned prone on to the Acromed 4 posterior positioning frame.  All bony prominences were padded.  The back and prepped and draped in the usual sterile fashion.  She had a previous 5 cm incision that was oblique to the left midline.  We elected to extend this incision proximally 4 cm.  The previous scar was excised.  The incision was carried down to the deep fascia  The deep fascia was then incised and the paraspinal muscles were stripped in a subperiosteal fashion and out over the transverse process of L5 and the ala of the sacrum.  Care was taken to protect the L4-5 joint.  We encountered extensive scar tissue about the previous L5-S1 laminotomy on the left side. Care was taken not to inadvertently enter the dura.  Once adequate exposure had been achieved, we placed our deep retractor, and then performed a facetectomy by transecting the pars interarticularis of L5 on the left and removing the inferior facet.  We then skeletonized the left S1 pedicle using Kerrison  punches.  We verified the medial border of the dural sac at the takeoff of both the L5 and the S1 nerve roots.  The S1 nerve root was scarred.  There was also extensive scar tissue ventral to the dura.  We freed up the S1 nerve root all the way out its foramen.  We performed a complete foraminotomy, confirming that the L5 root was free.  We then identified the disk space, coagulated bleeders with the bipolar cautery.  I then incised the annulus with a 15 blade, and then used straight and upbiting pituitaries to remove the disk material.  We placed a lamina distractor between the spinous processes of L5 and S1 to gain distraction.  Using an assortment of straight and angled curets, we completely removed the disk as well as the cartilaginous endplates all the way across to the other side and all the way anteriorly.  We irrigated the disk space.  At this point, I placed a 9 mm trial Devix spacer, took an x-ray confirming that we had a good interference fit.  We then packed a Devix cage, 9 mm with autogenous local bone graft that was taken from the facet and lamina, cleaned, and morcellized.  Before placing the cage, we packed AlloMatrix bone substitute that had been mixed with the remaining morcellized bone tightly into the disk space all the way across to the other side.  The cage was then placed.  We used fluoroscopy to confirm that the position was acceptable in both the AP and lateral plane.  We then inspected the L5 and S1 nerve roots and they were found to be free and there was no apparent injury.  At this point, we placed pedicle screws at L5 and S1 using an anatomic probing technique along with fluoroscopy.  We identified the anatomic starting point, decorticated it with the high speed bur, and then used the straight pedicle awl to cannulate the pedicles.  We then palpated the pedicle walls with the awl-tip probe, confirming that there was no breech.  We then tapped and then once  again palpated with the ball-tip probe.  The screw was then placed.  We  placed 6.25 mm screws at L5 and 7 mm screws at S1.  Screws measured 40 mm at L5 and 35 mm at S1.  We confirmed acceptable positioning with fluoroscopy.  We then used triggered EMGs, stimulating each screw, and did not get a response until greater than 20 mA.  It should also be noted that we monitored free running EMGs throughout the TLIF procedure as well as during the neural decompression.  At this point, we placed 45 mm straight rod into polyaxial screw heads, and locked then into position with the appropriate typhoon caps.  Gentle compression was applied bilaterally.  We had decorticated the transverse processes of L5 towards the sacral ala bilaterally and tightly packed the remaining morcellized bone and AlloMatrix allograft into the lateral gutter.  On the right side, we decorticated the L5-S1 facet, as well as the lamina of L5 at the sacral ala, and  again packed bone over the interlaminar space and within the facet joint on the right.  The wound was copiously irrigated.  A deep drain was placed.  The fascia was closed with a running #1 Vicryl followed by a 2-0 Vicryl in the subcutaneous layer and then a running 3-0 nylon suture.  A sterile dressing was applied.  The patient was turned supine and extubated without difficulty and able to move her upper and lower extremities.  It should be noted that two hours before closure, we used a 25-gauge spinal needle to perform an intrathecal injection at the L3-4 interspace.  We injected 0.5 cc of long-acting preservative-free morphine. Dictated by:   Patricia Nettle, M.D. Attending Physician:  Patricia Nettle. DD:  08/27/01 TD:  08/29/01 Job: [id number]

## 2010-08-25 NOTE — Op Note (Signed)
NAME:  Katherine Hayden, Katherine Hayden             ACCOUNT NO.:  [id number]   MEDICAL RECORD NO.:  [id number]          PATIENT TYPE:  INP   LOCATION:  1824                         FACILITY:  [facility]   PHYSICIAN:  Thornton Park. Daphine Deutscher, MD  DATE OF BIRTH:  05-15-1965   DATE OF PROCEDURE:  06/22/2004  DATE OF DISCHARGE:                                 OPERATIVE REPORT   PREOPERATIVE DIAGNOSIS:  Acute appendicitis.   POSTOPERATIVE DIAGNOSIS:  Acute appendicitis.   PROCEDURE:  Laparoscopic appendectomy.   SURGEON:  Thornton Park. Daphine Deutscher, MD   ANESTHESIA:  General endotracheal.   DESCRIPTION OF PROCEDURE:  [name] was taken to the OR in the early  morning hours of June 22, 2004 and given general anesthesia.  The abdomen  was prepped widely with Betadine and draped sterilely.  I went in through  the umbilicus using the Hasson technique without difficulty and inflated the  abdomen.  A 10/11 was placed in the left lower quadrant and then a 5 in the  right upper quadrant.  The appendix was quite large and bulbous and the tip  was suppurative and had a purulent exudate on it.  I first localized the  base of the appendix and transected that with the endo GIA blue cartridge.  I then went to the mesentery with a harmonic scalpel and when I approached  the appendiceal artery, I double-clipped that with clips and then harmonized  it.  The appendix was placed in a bag and brought out through the umbilicus.  I irrigated and there was no bleeding from the appendiceal stump.  I went  back and looked again and again it looked dry, so I elected to repair the  umbilical defect under laparoscopic vision with 0 Vicryl and then deflate  the abdomen and remove my trocars.  The wounds were closed with 4-0 Vicryl,  Benzoin and Steri-Strips.  The patient tolerated the procedure well and was  taken to the recovery room in satisfactory condition.      MBM/MEDQ  D:  06/22/2004  T:  06/22/2004  Job:  [id number]   cc:   Rosalyn Gess. Norins, M.D. [facility]

## 2010-08-25 NOTE — Discharge Summary (Signed)
[facility]  Patient:    Katherine Hayden, Katherine Hayden Visit Number: [id number] MRN: [id number]          Service Type: SUR Location: 4W 0484 02 Attending Physician:  Skip Mayer Dictated by:   Della Goo, P.A.-C. Admit Date:  01/08/2001 Discharge Date: 01/10/2001                             Discharge Summary  ADMITTING DIAGNOSES: 1. 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 2. Lateral recess stenosis, L5-S1, on the left. 3. Hiatal hernia with gastroesophageal reflux disease. 4. Hypothyroidism per patient history.  DISCHARGE DIAGNOSES: 1. 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 2. Lateral recess stenosis, L5-S1, on the left. 3. Hiatal hernia with gastroesophageal reflux disease. 4. Hypothyroidism per patient history.  PROCEDURE: 1. On January 08, 2001, patient underwent microdiskectomy and hemilaminectomy    at L5-S1 on the left. 2. Decompression of the lateral recess at L5-S1 on the left. 3. Foraminotomy at the L5 root and the S1 root on the left.  This was    performed by Dr. Darrelyn Hillock, assisted by Dr. Montez Morita under general anesthesia.  CONSULTATIONS:  None.  HISTORY OF PRESENT ILLNESS:  Patient is a 45 year old white female with recurrent herniated nucleus pulposus at L5-S1 on the left.  Pain and dysfunction have been refractory to conservative treatment and it was felt she would require surgical intervention.  HOSPITAL COURSE:  The patient tolerated the procedure without difficulties. Postoperatively, patient continued to have decreased sensation as well as strength of the left lower extremity in the L5-S1 distribution.  Her pain was not well controlled on the first postoperative day and she was started on IV Toradol.  She received the IV Toradol and gained better control of her discomfort.  Mepergan Fortis was utilized as her pain medication.  She received occupational therapy for ADLs and ambulation and was  slow for advancement.  Recommendations for durable medical equipment were made by the occupational therapist and arranged prior to the patients discharge.  On the second postoperative day, patient was more comfortable.  She was able to be out of bed ambulating in the hallway.  Her dressing was changed and the wound was found to be healing well.  There was improvement in her neurovascular function of the left lower extremity as compared to postoperatively.  Patient was voiding without difficulties prior to discharge, she had no nausea and vomiting, her vital signs were stable, and she was afebrile.  Patient was felt to be stable for discharge to her home.  PERTINENT LABORATORY VALUES:  Admission labs showed hemoglobin and hematocrit, CMET all within normal limits.  No chest x-ray or EKG were required for the procedure.  PLAN:  Patient is being discharged to her home.  She has been provided with a 3-in-1 commode, reacher, rolling walker with wheels, and [employer] tub seat with back support.  She will continue ambulation as tolerated at home. Dressing change will be done daily at home, and she may shower on the third postoperative day if there is no wound drainage.  She will follow up with Dr. Darrelyn Hillock two weeks from the date of her surgery.  Prescriptions given at discharge include Mepergan Fortis, #40, one every 4 to 6 hours as needed for pain, Robaxin 500 mg, #40, one every 8 hours as needed for spasm, Vioxx 25 mg #30 one p.o. b.i.d. for five days followed by once daily.  She has been advised to call the office if there are questions or concerns prior to her return office visit. Dictated by:   Della Goo, P.A.-C. Attending Physician:  Skip Mayer DD:  01/10/01 TD:  01/10/01 Job: [id number]

## 2010-08-25 NOTE — Op Note (Signed)
[facility]  Patient:    Katherine Hayden, Katherine Hayden                      MRN: [id number] Proc. Date: 08/29/00 Adm. Date:  [id number] Attending:  Skip Mayer                           Operative Report  PREOPERATIVE DIAGNOSIS:  Large central and to left herniated lumbar disk at L5-S1 on the left.  Note, all her symptoms are on the left.  She had a strongly positive straight leg raising on the left.  POSTOPERATIVE DIAGNOSIS:  Large central and to left herniated lumbar disk at L5-S1 on the left.  Note, all her symptoms are on the left.  She had a strongly positive straight leg raising on the left.  OPERATION: 1. Hemilaminectomy at L5-S1 on the left. 2. Microdiskectomy at L5-S1 on the left.  SURGEON:  Georges Lynch. Gioffre, M.D.  ASSISTANT:  Philips J. Montez Morita, M.D.  ANESTHESIA:  General.  DESCRIPTION OF PROCEDURE:  After general anesthesia, routine orthopedic prep and draping of the lower back was carried out.  The patient was placed on the spinal frame.  She had 1 g of IV vancomycin preoperative.  At this time, two needles were placed in the back for localization purposes, and an x-ray was taken.  Once we verified the position, an incision was made over the L5-S1 interspace.  Another x-ray was taken which showed that we were at the exact location at L5-S1.  The muscle was stripped from the lamina and the spinous processes, and the self-retaining McCullough retractor was inserted. At this time, I did a hemilaminectomy in the usual fashion.  Great care was taken to protect the underlying dura.  I then removed the ligamentum flavum. We then inserted the DErrico retractor, gently retracting the nerve root and the dura.  We went down and noted that she had a large area of lateral recess vein.  We bipolared those with the bipolar.  Once good hemostasis was maintained, a cruciate incision was made in the posterior longitudinal ligament, and a complete  diskectomy was carried out.  She had a large herniated disk.  We made multiple passes into the disk space with the Epstein curet, as well as the pituitary rongeurs.  Once we had good freedom of the dura and nerve root, we then went on and probed the root, and made sure that the root was free, and it was.  We thoroughly irrigated out the area, and good hemostasis was maintained and thrombin soaked Gelfoam was applied.  Following this, I then closed the wound in layers in the usual fashion.  The skin was closed with metal staples and a sterile Neosporin dressing was applied.  The patient left the operating room in satisfactory condition. DD:  08/29/00 TD:  08/30/00 Job: [id number]

## 2010-08-25 NOTE — Procedures (Signed)
[facility]  Patient:    Katherine Hayden, Katherine Hayden                        MRN: [id number] Proc. Date: 08/27/00 Attending:  Everardo All. Madilyn Fireman, M.D. CC:         Monica Becton, M.D.   Procedure Report  PROCEDURE:  Esophagogastroduodenoscopy with biopsy.  INDICATIONS FOR PROCEDURE:  Persistent epigastric abdominal pain without adequate response to Prevacid with upper GI series showing a proximal body ulcer. Presence of the ulcer in light of one year history of use of Prevacid was felt possibly suspicious for malignancy or at least nonhealing ulcer.  DESCRIPTION OF PROCEDURE:  The patient was placed in the left lateral decubitus position then placed on the pulse monitor with continuous low flow oxygen delivered by nasal cannula. She was sedated with 100 mg IV Demerol and 10 mg IV Versed. The Olympus video endoscope was advanced under direct vision into the oropharynx and esophagus. The esophagus was straight and of normal caliber at the squamocolumnar line at 38 cm. There was no visible esophagitis, ring, stricture or other abnormality of the gastroesophageal junction. The stomach was entered and a small amount of liquid secretions were suctioned from the fundus. Retroflexed view of the cardia was unremarkable. There were a few streaks of erythema in the body and distal fundus but no definite ulcer. There were similar streaks of erythema in the antrum with no focal erosions or ulcers. The pylorus was nondeformed and easily allowed passage of the endoscope tip into the duodenum. Both the bulb and second portion were well inspected and appeared to be within normal limits. The scope was withdrawn back into the stomach and CLOtest obtained. The scope was then withdrawn and the patient returned to the recovery room in stable condition. She tolerated the procedure well and there were no immediate complications.  IMPRESSION:  Proximal and antral gastritis, no ulcer seen on  this procedure.  PLAN:  Await CLOtest and treat for eradication of Helicobacter if positive and will obtain gallbladder ultrasound. DD:  08/27/00 TD:  08/27/00 Job: [id number]

## 2010-08-25 NOTE — Op Note (Signed)
NAME:  VYLET, MAFFIA             ACCOUNT NO.:  [id number]   MEDICAL RECORD NO.:  [id number]          PATIENT TYPE:  INP   LOCATION:  1403                         FACILITY:  [facility]   PHYSICIAN:  Sharlet Salina T. Hoxworth, M.D.DATE OF BIRTH:  07/12/1965   DATE OF PROCEDURE:  11/27/2005  DATE OF DISCHARGE:  11/28/2005                                 OPERATIVE REPORT   PRE AND POSTOPERATIVE DIAGNOSIS:  Cholelithiasis and cholecystitis.   PROCEDURE:  Laparoscopic cholecystectomy with intraoperative cholangiogram.   SURGEON:  Sharlet Salina T. Hoxworth, M.D.   ASSISTANT:  Gita Kudo, M.D.   ANESTHESIA:  General.   BRIEF HISTORY:  Shya Kovatch is a 45 year old female who presents with  about a week of episodic and now several days of constant epigastric and  right upper quadrant abdominal pain and nausea.  Workup included a  gallbladder ultrasound showing some thickening of the gallbladder wall and  multiple gallstones.  She has marked tenderness in the right upper quadrant.  LFTs were normal.  She is felt to have cholecystitis and laparoscopic  cholecystectomy with cholangiogram has been recommend and accepted.  This  procedure, indications, risks of bleeding, infection, bile leak and bile  duct injury discussed and understood.  Now brought operating room for this  procedure.   DESCRIPTION OF PROCEDURE:  The patient was brought to the operating room,  placed supine position operating table and general orotracheal anesthesia  was induced.  The abdomen was widely sterilely prepped and draped.  PAS were  placed.  She received preoperative IV antibiotics.  Correct patient  procedure were verified.  Local anesthesia was used infiltrate the trocar  sites prior to the incisions.  1 cm incision made at the umbilicus.  Dissection carried down to the midline fascia which was sharply incised for  1 cm and the peritoneum entered under direct vision.  Through mattress  sutures of Vicryl, the  Hasson trocar was placed and pneumoperitoneum  established.  Under direct vision 10 mL trocar was placed in the subxiphoid  area and two 5 mm trocars on the right subcostal margin.  The gallbladder  was visualized and did appear mildly distended and definitely thickened and  somewhat inflamed.  There were some chronic omental adhesions to the fundus  of gallbladder that were taken down with cautery dissection.  The  infundibulum retracted inferolaterally and fibrofatty tissue was stripped  down off the neck of the gallbladder toward the porta hepatis.  Peritoneum  anterior and posterior to Calot's triangle was incised and Calot's triangle  with distal gallbladder was thoroughly dissected.  The cystic duct  gallbladder junction was dissected 360 degrees.  The cystic artery was  identified in Calot's triangle.  When the anatomy appeared clear.  The  cystic duct was clipped at the gallbladder junction.  The cystic duct was  opened in preparation for cholangiogram and a number of small stones were  milked back out of the cystic duct until there was a clear bile running from  the direction of the common bile duct.  Operative cholangiogram was then  obtained which showed good filling of a  normal common bile duct and  intrahepatic ducts with free flow into the duodenum and no filling defects.  Following this, cholangiocath was removed.  The cystic duct was doubly  clipped proximally and divided.  Cystic artery was doubly clipped  proximally, clipped distally divided.  The gallbladder was then dissected  free from its bed using hook cautery.  A small vascular branch or possibly  duct of Luschka was clipped and divided.  The gallbladder was dissected from  its bed and removed through the umbilicus.  Complete hemostasis assured.  Right upper quadrant irrigated and suctioned until clear.  Trocars removed  under direct vision.  All CO2 evacuated.  The mattress sutures  secured at the umbilicus.  Skin  incisions were closed with interrupted  subcuticular 4-0 Monocryl and Steri-Strips.  Sponge, needle and instrument counts were correct.  Patient taken recovery  room in good position having tolerated the procedure well .      Lorne Skeens. Hoxworth, M.D.  Electronically Signed     BTH/MEDQ  D:  11/27/2005  T:  11/28/2005  Job:  [id number]

## 2010-08-25 NOTE — H&P (Signed)
NAME:  Katherine Hayden, Katherine Hayden             ACCOUNT NO.:  [id number]   MEDICAL RECORD NO.:  [id number]          PATIENT TYPE:  INP   LOCATION:  1824                         FACILITY:  [facility]   PHYSICIAN:  Thornton Park. Daphine Deutscher, MD  DATE OF BIRTH:  01-23-1966   DATE OF ADMISSION:  06/21/2004  DATE OF DISCHARGE:                                HISTORY & PHYSICAL   CHIEF COMPLAINT:  Right lower quadrant abdominal pain and appendicitis.   HISTORY OF PRESENT ILLNESS:  Katherine Hayden is a 45 year old white female  who presented to the emergency room tonight with two-day history of  abdominal pain increasing in intensity localizing to the right side. She was  initially assessed at about 4 o'clock this afternoon and CT scan was  obtained which showed appendicitis.   PAST MEDICAL HISTORY:  She is followed by Dr. Rosalyn Gess. Norins and Dr. Lina Sar and has problems with hypothyroidism.   CURRENT MEDICATIONS:  Hydrocodone, acetaminophen, Levoxyl, Zoloft, birth  control pills, Nexium, amitriptyline, hydrochloride.   ALLERGIES:  AMOXICILLIN, SULFA, and DILAUDID produce a rash.   SOCIAL HISTORY:  The patient does not smoke, drink, or use drugs.   PAST SURGICAL HISTORY:  Three back surgeries. She also has a hiatal hernia  with gastroesophageal reflux disease.   PHYSICAL EXAMINATION:  VITAL SIGNS:  Afebrile. Pulse 100, respirations 24,  blood pressure 144/85.  HEENT:  Normocephalic. Sclerae are nonicteric. Pupils are equal, round, and  reactive to light. Nose and throat exam unremarkable.  NECK:  Supple.  CHEST:  Clear.  HEART:  Sinus rhythm without murmurs or gallops.  ABDOMEN:  Modestly obese. Tender in the right lower quadrant.  EXTREMITIES:  Full range of motion.   LABORATORY DATA:  White count 14,900, hemoglobin 12.3.   IMPRESSION:  Acute appendicitis.   PLAN:  Laparoscopic appendectomy. I discussed the procedure with her and we  will proceed as soon as OR time is available.      MBM/MEDQ  D:  06/22/2004  T:  06/22/2004  Job:  [id number]

## 2010-08-25 NOTE — Op Note (Signed)
[facility]  Patient:    Katherine Hayden, Katherine Hayden Visit Number: [id number] MRN: [id number]          Service Type: DSU Location: DAY Attending Physician:  Skip Mayer Dictated by:   Georges Lynch Darrelyn Hillock, M.D. Admit Date:  01/08/2001                             Operative Report  NO DICTATION. Dictated by:   Georges Lynch Darrelyn Hillock, M.D. Attending Physician:  Skip Mayer DD:  01/08/01 TD:  01/08/01 Job: [id number]

## 2010-08-25 NOTE — H&P (Signed)
[facility]  Patient:    Katherine Hayden, Katherine Hayden. Visit Number: [id number] MRN: [id number]          Service Type: Attending:  Patricia Nettle, M.D. Dictated by:   Dorie Rank, P.A. Adm. Date:  08/27/01   CC:         Katherine Hayden, P.A.-C., [facility], [phone] [address], [place] [address]   History and Physical  DATE OF BIRTH:  2065/12/31  CHIEF COMPLAINT:  Low back pain with radiculopathy in the left lower extremity.  HISTORY OF PRESENT ILLNESS:  [name] is a pleasant 45 year old female who has a long history of low back pain.  He problems date back to Aug 11, 2000, when she was injured on the job lifting a 40-pound bucket at that time.  She was diagnosed with L5-S1 disk herniation and S1 radiculopathy.  She was treated appropriately by Georges Lynch. Gioffre, M.D., with a left L5-S1 microsurgical laminectomy and diskectomy after she failed conservative treatment.  Her leg pain did resolve.  She felt good for three months until October of 2002 when she once again developed symptoms.  A repeat MRI scan was performed and showed recurrent disk herniations on the left at L5-S1.  She was taken back to the operating room for a lateral recess decompression and removal of the recurrent herniation, as well as lysis of adhesions.  She failed to improve after surgery.  At that point, her back became more painful than her left lower extremity.  It was noted that her symptoms were worse with activity, including exercise, sitting, standing, walking, and sneezing.  She has tried multiple medications, including various anti-inflammatories and narcotic analgesics.  She had tried physical therapy and facet injections with only minimal benefit.  She is to the point where she can only work light duty. She is very frustrated with her symptoms and felt like this was an unacceptable quality of life.  It was noted that she was quite uncomfortable, sitting  and frequently changed position.  Upon standing, it took her a few seconds to come to an erect position.  She is known to have a healed midline incision at L5-S1 and tenderness over the left buttock down the sciatic area. The motor exam was 5/5 throughout, except for weakness to the left gastrocsoleus.  Straight leg was positive on the left.  She is unable to heel or toe walk secondary to pain.  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  It was felt that due to her ongoing pain despite conservative treatment, interference with activities of daily living, and information gathered from physical exam and diagnostic studies that she would benefit from undergoing a revision of the L5-S1 laminectomy and L5-S1 fusion with pedicle screws with possible bone grafting. The risks and benefits of the procedure were discussed with the patient.  She elected to proceed.  She did obtain pulmonary clearance from Katherine Hayden, M.D., as well as medical clearance from Katherine Hayden, P.A.-C.  ALLERGIES:  AMOXIL causes hives.  SULFA causes hives.  CODEINE and VICODIN causes GI upset.  MEDICATIONS: 1. Nexium 30 mg one p.o. b.i.d. 2. Ortho-Novum 7/7/7. 3. Xanax 0.5 mg one p.o. q.d. p.r.n. 4. Synthroid 125 mcg one p.o. q.d.  PAST MEDICAL HISTORY:  Significant for: 1. Sarcoidosis. 2. Hypothyroidism. 3. GERD. 4. Occasional vertigo relieved by Meclozine.  4. Migraine headaches. 5. Anxiety and depression.  PAST SURGICAL HISTORY: 1. Lumbar surgery x 2. 2. Right shoulder surgery. 3. Removal of a heel spur.  SOCIAL HISTORY:  The patient is married.  She works in [employer].  She denies any tobacco.  She drinks alcohol only socially.  FAMILY HISTORY:  Mother, living, age 72, and has a history of hypertension and low back pain.  Father, living, age 31, with a history  of MI.  She has a brother in her mid 39s who is deceased of a myocardial infarction.  REVIEW OF SYSTEMS:  Occasional hot flashes.  No fevers.  No history of any bleeding tendencies.  Pulmonary:  Occasional shortness of breath with exertion.  No orthopnea.  No hemoptysis.  Cardiovascular:  No chest pain, angina, or orthopnea.  Endocrine:  Positive for hypothyroidism and a history of diabetes mellitus.  GU:  No hematuria, dysuria, or discharge.  GI:  No nausea, vomiting, diarrhea, melena, or constipation.  Neurologic:  No seizures.  History of migraine headaches occasionally.  No paralysis.  PHYSICAL EXAMINATION:  GENERAL APPEARANCE:  Alert and oriented x 3.  A 45 year old female who appears in distress sitting in a chair.  She has a resting tremor.  VITAL SIGNS:  Pulse 80, respirations 20, blood pressure 120/80.  HEENT:  The head is normocephalic and atraumatic.  The oropharynx is clear.  NECK:  Supple.  Negative for cervical lymphadenopathy.  CHEST:  Clear to auscultation bilaterally.  No rhonchi, rales, or wheezes.  BREASTS:  Not pertinent to present illness.  HEART:  S1 and S2 negative for murmur or gallop.  Heart regular in rate and rhythm.  ABDOMEN:  Round and nontender.  Positive bowel sounds in all four quadrants.  GENITOURINARY:  Not pertinent to present illness.  EXTREMITIES:  Please see the history of present illness for physical exam to the low back and lower extremities.  SKIN:  Intact.  She has a well-healed midline incision of the L spine.  LABORATORY DATA:  Preoperative labs and x-rays are pending.  IMPRESSION: 1. Degenerative disk disease, post laminectomy syndrome, and lumbago. 2. Sarcoidosis. 3. Hypothyroidism. 4. Gastroesophageal reflux disease. 5. Migraine headaches. 6. History of vertigo. 7. Anxiety and depression.   PLAN:  The patient is scheduled for revision L5-S1 laminectomy, L5-S1 pedicle screws, transverse lumbar interbody fusion, and  possible posterior iliac crest bone grafting with allograft. Dictated by:   Dorie Rank, P.A. Attending:  Patricia Nettle, M.D. DD:  08/19/01 TD:  08/20/01 Job: [id number]

## 2010-08-25 NOTE — Op Note (Signed)
[facility]  Patient:    Katherine Hayden, Katherine Hayden Visit Number: [id number] MRN: [id number]          Service Type: DSU Location: DAY Attending Physician:  Skip Mayer Dictated by:   Georges Lynch Darrelyn Hillock, M.D. Proc. Date: 01/08/01 Admit Date:  01/08/2001                             Operative Report  PREOPERATIVE DIAGNOSES: 1. 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 2. 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  POSTOPERATIVE DIAGNOSES: 1. 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 2. 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  OPERATION: 1. Microdiskectomy and hemilaminectomy at L5-S1 on the left. 2. Decompression of the lateral recess at L5-S1 on the left. 3. Foraminotomy at the L5 root and the S1 root on the left.  SURGEON:  Georges Lynch. Gioffre, M.D.  ASSISTANT:  Philips J. Montez Morita, M.D.  DESCRIPTION OF PROCEDURE:  Under general anesthesia with the patient on a spinal frame, the patient had 1 g of IV Ancef.  Sterile prep and draping of the back was carried out.  The incision was made through the old incision site.  Bleeders were identified and cauterized.  The muscle then was stripped from the spinous process and lamina in the usual fashion.  At this time, two instruments were placed in the back and an x-ray was taken to verify L5-S1 position on the left.  Following this, we then removed the scar tissue from the lamina of 5, and also from the sacrum.  Once this was done, I then simply expanded my previous hemilaminectomy site, and then identified the dura.  The remaining scar tissue was removed.  I went out lateral and decompressed the lateral recess.  I also went out and did foraminotomies for the L5 and S1 root.  The S1 root was quite scarred down and bound down with herniated disk material and scar tissue.  I gently freed the root up and held the root in place with the DErrico retractor.  I made a cruciate  incision down into the posterior longitudinal ligament and complete diskectomy was carried out.  We did an extensive diskectomy at this time.  Multiple passes were made into the disk space, went up subligamentous, and removed the subligamentous fragments proximally and distally as well, and went out laterally.  We also went up under the 5 root to make sure there were no disk fragment there.  Once this was completed, we did our foraminotomy to both roots.  The root canals were free.  We were able to easily pass a hockey stick down the foramen.  I thoroughly irrigated out the area.  Then, we applied thrombin-soaked Gelfoam to the operative site, and the wound was closed in layers in the usual fashion.  Sterile dressings were applied.  The patient left the operating room in satisfactory condition. Dictated by:   Georges Lynch Darrelyn Hillock, M.D. Attending Physician:  Skip Mayer DD:  01/08/01 TD:  01/08/01 Job: [id number]

## 2010-08-25 NOTE — Discharge Summary (Signed)
[facility]  Patient:    Katherine Hayden, Katherine Hayden Visit Number: [id number] MRN: [id number]          Service Type: EMS Location: ED Attending Physician:  Nelia Shi Dictated by:   Della Goo, P.A.-C. Admit Date:  09/10/2001 Discharge Date: 09/10/2001                             Discharge Summary  ADMISSION DIAGNOSES:  1. Degenerative disk disease of L5-S1.  2. Post laminectomy syndrome.  3. Lumbago.  4. Hypothyroidism.  5. Sarcoidosis.  6. Gastroesophageal reflux disease.  7. Migraine headaches.  8. History of vertigo.  9. Anxiety and depression.  DISCHARGE DIAGNOSES:  1. Degenerative disk disease of L5-S1.  2. Post laminectomy syndrome.  3. Lumbago.  4. Hypothyroidism.  5. Sarcoidosis.  6. Gastroesophageal reflux disease.  7. Migraine headaches.  8. History of vertigo.  9. Anxiety and depression. 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  PROCEDURE:  On Aug 27, 2001, the patient underwent:  1. Revision of left L5-S1 laminotomy with lysis of adhesions and facetectomy.  2. Posterior spine arthrodesis of L5-S1.  3. Pedicle screw instrumentation of L5-S1.  4. Transforaminal lumbar anterior body fusion of L5-S1 with placement of a     Devix titanium interbody cage.  5. Intrathecal injection of long active preservative-free morphine.  6. Neuro monitoring utilizing NuVasive neurovision system.  This was     performed by Dr. Noel Gerold, assisted by Dr. Shelle Iron under general anesthesia.  CONSULTATIONS:  None.  BRIEF HISTORY:  The patient is a 45 year old white female who is status post microdiskectomy of the left L5-S1 level x2.  After the second surgery, she developed severe low back pain that was unresponsive to conservative treatment.  She has also experienced left leg pain in the S1 pattern.  The patient has tried multiple conservative measures including ESIs, bracing, physical therapy, and narcotic analgesics.  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  Changes also were noted about the L5-S1 endplates.  It was felt she would require surgical intervention as a form of definitive treatment and was admitted for the procedure as stated above.  BRIEF HOSPITAL COURSE:  The patient tolerated the procedure without difficulty.  Intraoperatively, she received 200 cc of Cell Saver blood.  On the first postoperative day, the patient was using Dilaudid PCA medication for her pain control, and unfortunately developed significant itching.  This was changed to morphine was tolerated better.  She continued on the morphine for pain control until she was able to be weaned to p.o. analgesics.  She eventually was weaned to oral analgesics as her p.o. intake was increased and was able to manage with oral analgesics and muscle relaxers.  The patient continued to have left lower extremity numbness and discomfort.  As the postoperative period advanced, she did receive some relief of these symptoms and noted that her symptoms of left leg pain and numbness was not as severe as preoperatively.  Primarily, she had good EHL and dorsiflexion strength on the left.  However, continued to have numbness of the left great toe.  On the first postoperative day, her abdomen was soft.  However, she continued to have sluggish bowel sounds.  She was continued on GI precautions for ileus. As she began to have flatus, she was advanced to a full liquid diet and continued on Reglan on a p.o. basis.  She was given Peri-Colace as a stool softener.  The patient did have a bowel movement and her diet was advanced without difficulty.  The patient had a drain postoperatively.  This was left in place on the first postoperative day and removed on the second postoperative day.  She was noted to have sensitivity to the tape and a small abrasion at the distal wound which was noted to be healing without signs  of infection.  Through the remainder of the hospital stay, dressing changes were done daily, and the wound was noted to be healing well.  The patient was started on physical therapy on the first postoperative day and was able to ambulate wearing her brace.  She tolerated the brace well and advanced with physical therapy steadily throughout the hospital stay.  Prior to discharge, she was ambulating 300 feet in the hallway.  On postoperative day #4, the patient was stable.  She continued to have decreased sensation globally in the left lower extremity.  However, her motor function continued to improve daily.  She was afebrile and vital signs were stable.  The pain was tolerable using p.o. analgesics and she was felt safe for discharge to home.  PERTINENT LABORATORY VALUES:  Admission CBC with RBC 3.52, hemoglobin 11.2, hematocrit 31.9, and the remainder of values normal.  Hemoglobin and hematocrit were monitored throughout the hospital stay.  The patient dropped to 9.3 on Aug 29, 2001 with her hemoglobin and 26.9 with hematocrit.  It was felt she would require transfusion as this was significantly lower than her admission hemoglobin of 11.2.  She received two units of packed red blood cells and tolerated the transfusion well.  Coagulation studies on admission were normal.  Chemistry studies on admission were normal with the exception of glucose 123.  Repeat chemistries on Aug 28, 2001, showed elevated glucose at 133, BUN 4, and calcium 8.3, and all remaining values within normal limits. Urinalysis on admission with 3-6 wbcs and a few bacteria with moderate leukocyte esterase.  PLAN:  The patient was discharged to her home.  She was given prescriptions for Robaxin 500 mg one every eight hours as needed for spasm, Demerol one every four to six hours as needed for pain, Darvocet-N 100 one every four to six hours as needed for pain.  She will continue to ambulate at home using the walker  for assistance.  She will wear her brace at all times when she is ambulating.  Dressing changes will be done daily at home.  She will be allowed  to shower.  She will follow up with Dr. Noel Gerold two weeks following her surgery and has been advised to call the office to do so.  She will continue on a regular diet.  She will use over-the-counter stool softeners or laxatives as needed to avoid constipation.  If she has questions or questions prior to her return office visit, she has been advised to call the office. Dictated by:   Della Goo, P.A.-C. Attending Physician:  Nelia Shi DD:  09/15/01 TD:  09/17/01 Job: [id number]

## 2010-08-25 NOTE — H&P (Signed)
NAME:  [name], [name] NO.:  [id number]   MEDICAL RECORD NO.:  [id number]           PATIENT TYPE:   LOCATION:                                FACILITY:  [facility]   PHYSICIAN:  Sharlet Salina T. Hoxworth, M.D.DATE OF BIRTH:  Aug 08, 1965   DATE OF ADMISSION:  11/27/2005  DATE OF DISCHARGE:                                HISTORY & PHYSICAL   CHIEF COMPLAINT:  Abdominal pain.   HISTORY OF PRESENT ILLNESS:  Katherine Hayden is a 40-year white female who  approximately 1 week ago began having intermittent episodes of epigastric  pain radiating to her right upper quadrant.  Last night she had a more  severe persistent episode of pain associated with nausea and presented to  [facility] Emergency Room.  This pain has persisted overnight, despite  several doses of pain medication.  She describes severe pain, pressure-like,  in her epigastrium radiating straight through to her back and around her  right costal margin.  She has had nausea, but no vomiting.  She has felt  feverish, but none documented.  Denies dark urine or yellow discoloration of  her eyes.  She had not had any similar symptoms prior to 1 week ago.   PAST MEDICAL HISTORY:   SURGERY:  1. Laparoscopic cholecystectomy.  2. Back surgery x3.   MEDICAL:  1. Chronic back pain.  2. Depression.  3. Hypothyroidism.   CURRENT MEDICATIONS:  1. Synthroid 125 mcg daily.  2. Oxycodone extended release 20 mg b.i.d.  3. Requip 1 mg p.r.n. at night.  4. Nexium 40 mg p.r.n.   ALLERGIES:  AMOXICILLIN, CODEINE and SULFA.   SOCIAL HISTORY:  The patient is married.  She is disabled.  Denies  cigarettes or alcohol.   FAMILY HISTORY:  Noncontributory.   REVIEW OF SYSTEMS:  GENERAL:  Felt feverish, non-documented.  HEENT:  No  vision, hearing, or swallowing problems.  RESPIRATORY:  Denies shortness of  breath, cough or wheezing.  CARDIAC:  Denies chest pain, palpitations,  swelling or history of heart disease.  ABDOMEN/GI:  As  above.  GU: She does  take birth control pills.  She has not had a menstrual period for several  months.  No urinary symptoms.  MUSCULOSKELETAL:  Positive for chronic back  pain.  NEUROLOGIC:  No numbness, weakness or syncope.  HEMATOLOGIC:  No  history of blood clots or abnormal bleeding.   PHYSICAL EXAM:  VITAL SIGNS:  Temperature is 98.5, pulse 82, respirations  20, blood pressure 123/70.  GENERAL:  She is a mildly overweight white female who appears uncomfortable.  SKIN:  Warm and dry.  No rash or infection.  HEENT:  No palpable thyromegaly or masses.  Sclera were nonicteric.  Nares  and oropharynx clear.  LYMPH NODES:  No cervical, subclavicular or inguinal nodes palpable.  LUNGS:  Clear without wheezing or increased work of breathing.  CARDIAC:  Regular rate and rhythm.  No murmurs.  No edema.  ABDOMEN:  Nondistended.  Healed laparoscopic incisions.  There is tenderness  and guarding in the epigastrium and right upper quadrant.  No palpable  masses  or splenomegaly.  EXTREMITIES:  No joint swelling or deformity.  NEUROLOGIC:  She is alert and oriented.  Motor and sensory exams grossly  normal.   LABORATORY:  White count elevated at 12,800, hemoglobin 12.4.  Electrolytes,  LFTs, lipase and urinalysis all within normal limits.   IMAGING:  Ultrasound of the gallbladder shows a thickened gallbladder wall  with stones.  Common bile duct appears normal   IMPRESSION AND PLAN:  Cholelithiasis with acute cholecystitis.  The patient  is being admitted.  She is being scheduled for urgent laparoscopic  cholecystectomy with cholangiogram.  She will receive preoperative  antibiotics, intravenous fluids and pain control.      Lorne Skeens. Hoxworth, M.D.  Electronically Signed     BTH/MEDQ  D:  11/27/2005  T:  11/27/2005  Job:  [id number]

## 2011-01-08 LAB — POCT I-STAT, CHEM 8
BUN: 6
Calcium, Ion: 1.19
Chloride: 107
Creatinine, Ser: 1
Glucose, Bld: 123 — ABNORMAL HIGH
HCT: 35 — ABNORMAL LOW
Hemoglobin: 11.9 — ABNORMAL LOW
Potassium: 3.7
Sodium: 142
TCO2: 25

## 2011-01-08 LAB — DIFFERENTIAL
Basophils Absolute: 0.1
Basophils Relative: 1
Eosinophils Absolute: 0.3
Eosinophils Relative: 3
Lymphocytes Relative: 23
Lymphs Abs: 2.4
Monocytes Absolute: 0.8
Monocytes Relative: 8
Neutro Abs: 6.9
Neutrophils Relative %: 66

## 2011-01-08 LAB — CBC
HCT: 34.3 — ABNORMAL LOW
Hemoglobin: 11.5 — ABNORMAL LOW
MCHC: 33.4
MCV: 89.8
Platelets: 337
RBC: 3.82 — ABNORMAL LOW
RDW: 12.5
WBC: 10.4

## 2011-01-08 LAB — WOUND CULTURE

## 2011-01-10 LAB — DIFFERENTIAL
Basophils Absolute: 0.1
Basophils Relative: 1
Eosinophils Absolute: 0.4
Eosinophils Relative: 4
Lymphocytes Relative: 33
Lymphs Abs: 3.6
Monocytes Absolute: 1.2 — ABNORMAL HIGH
Monocytes Relative: 11
Neutro Abs: 5.5
Neutrophils Relative %: 51

## 2011-01-10 LAB — CARDIAC PANEL(CRET KIN+CKTOT+MB+TROPI)
CK, MB: 0.9
CK, MB: 1
Relative Index: INVALID
Relative Index: INVALID
Total CK: 85
Total CK: 95
Troponin I: 0.01
Troponin I: 0.01

## 2011-01-10 LAB — CBC
HCT: 34.6 — ABNORMAL LOW
HCT: 36.2
Hemoglobin: 11.9 — ABNORMAL LOW
Hemoglobin: 12.3
MCHC: 33.9
MCHC: 34.4
MCV: 89.2
MCV: 89.9
Platelets: 246
Platelets: 285
RBC: 3.88
RBC: 4.03
RDW: 12.8
RDW: 12.9
WBC: 10.9 — ABNORMAL HIGH
WBC: 6.8

## 2011-01-10 LAB — POCT I-STAT, CHEM 8
BUN: 9
Calcium, Ion: 1.11 — ABNORMAL LOW
Chloride: 109
Creatinine, Ser: 0.9
Glucose, Bld: 97
HCT: 36
Hemoglobin: 12.2
Potassium: 3.7
Sodium: 141
TCO2: 23

## 2011-01-10 LAB — CK TOTAL AND CKMB (NOT AT ARMC)
CK, MB: 1
Relative Index: INVALID
Total CK: 97

## 2011-01-10 LAB — HEMOGLOBIN A1C
Hgb A1c MFr Bld: 5.5
Mean Plasma Glucose: 111

## 2011-01-10 LAB — LIPID PANEL
Cholesterol: 166
HDL: 39 — ABNORMAL LOW
LDL Cholesterol: 108 — ABNORMAL HIGH
Total CHOL/HDL Ratio: 4.3
Triglycerides: 97
VLDL: 19

## 2011-01-10 LAB — POCT CARDIAC MARKERS
CKMB, poc: 1.5
CKMB, poc: <1 — ABNORMAL LOW
Myoglobin, poc: 42.2
Myoglobin, poc: 58.7
Troponin i, poc: <0.05
Troponin i, poc: <0.05

## 2011-01-10 LAB — TROPONIN I: Troponin I: <0.01

## 2011-01-10 LAB — TSH: TSH: 14.377 — ABNORMAL HIGH

## 2011-01-10 LAB — HEPARIN LEVEL (UNFRACTIONATED)
Heparin Unfractionated: 0.33
Heparin Unfractionated: 0.55

## 2011-01-10 LAB — PREGNANCY, URINE: Preg Test, Ur: NEGATIVE

## 2011-01-10 LAB — D-DIMER, QUANTITATIVE: D-Dimer, Quant: 0.55 — ABNORMAL HIGH

## 2012-03-19 ENCOUNTER — Ambulatory Visit: Payer: Self-pay | Admitting: Internal Medicine

## 2012-03-25 ENCOUNTER — Ambulatory Visit (INDEPENDENT_AMBULATORY_CARE_PROVIDER_SITE_OTHER): Payer: Medicare Other | Admitting: Internal Medicine

## 2012-03-25 ENCOUNTER — Encounter: Payer: Self-pay | Admitting: Internal Medicine

## 2012-03-25 VITALS — BP 126/78 | HR 78 | Temp 98.6°F | Wt 213.0 lb

## 2012-03-25 DIAGNOSIS — E039 Hypothyroidism, unspecified: Secondary | ICD-10-CM

## 2012-03-25 NOTE — Progress Notes (Signed)
Subjective:     Patient ID: GWENDY BOEDER, female   DOB: [birth year], 46 y.o.   MRN: [id number]  HPI Ms. Ralphs is a pleasant 46 y/o woman referred by her PCP, PA-C Arnette Felts in [place], for management of uncontrolled hypothyroidism and a newly-found thyroid nodule. Pt is here with her sister and feels very anxious that she might have thyroid cancer. She was called that she has a thyroid nodule on a new thyroid U/S but does not know any other details.  Uncontrolled hypothyroidism: Pt. was dx with hypothyroidism approx 11 years ago. She was initially seen by Dr. Everardo All. TSH was lastly normal in 2009, but elevated since then - see below: Date Value Range  03/01/2010 98.50    12/22/2009 59.48    06/29/2009 60.06    04/25/2009 30.46    01/26/2008 0.40  0.35-5.50 microintl units/mL  12/18/2007 14.377*   12/05/2007 44.27* 0.35-5.50 microintl units/mL  04/09/2007 1.71  0.35-5.50 microintl units/mL   Her fT4 levels were normal or low: Free T4  Date Value Range Status  03/01/2010 0.23   Edited  12/22/2009 0.35   Edited  06/29/2009 1.1   Edited  04/25/2009 1.2   Edited  01/26/2008 0.7  0.6-1.6 ng/dL Final  She is taking generic Levothyroxine 225 mcg daily, increased from 200 at last appt with PCP this month, when TSH 169. She always denied missing doses. She takes it in am, along with all her medicines, including Nexium. She does not eat b'fast, eats lunch, or skips to dinner  New thyroid nodule: Pt denies neck compression spx. She was referred for thyroid U/S at her last appt with PCP due to her very high TSH. She had the thyroid U/S on 03/17/12 at [facility]. We contacted [facility]. During the appt and received the report of the U/S: new hypoechoic nodule 0.6 x 0.4 x 0.7 cm in R side of isthmus (on a background of heterogeneous hypoechoic lobulated gland, stable) - will scan.   I reviewed her chart including office notes, telephone notes, labs, and available scanned records. She had a  thyroid U/S in 12/2007 showing no nodules or suspicious areas but inhomogeneous throughout c/w thyroiditis.  She has a FH of thyroid ds. in brother (hyperthyroidism). No FH of thyroid cancer. No h/o radiation to head/neck.   She has a h/o sarcoidosis. Her LFTs were elevated and saw GI. I do not have these results.  Review of Systems Constitutional: + weight gain, + fatigue, and both subjective hyperthermia/hypothermia;  Eyes: no blurry vision, no xerophthalmia, but watery eyes ENT: no sore throat, no nodules palpated in throat, no dysphagia/odynophagia, raspy voice for 1 mo Cardiovascular: no CP/SOB/palpitations, but has leg swelling Respiratory: no cough/SOB Gastrointestinal: no N/V/C, has occasional diarrhea Musculoskeletal: no muscle/joint aches Skin: no rashes Neurological: no tremors/numbness/tingling/dizziness Psychiatric: no depression, but has anxiety and appears very anxious and started to cry during visit   Objective:   Physical Exam BP 126/78  Pulse 78  Temp 98.6 F (37 C) (Oral)  Wt 213 lb (96.616 kg)  SpO2 98%There is no height on file to calculate BMI.  Wt Readings from Last 3 Encounters:  03/25/12 213 lb (96.616 kg)  03/10/10 195 lb 4 oz (88.565 kg)  12/05/07 202 lb (91.627 kg)  Constitutional: overweight, in NAD, but anxious Eyes: PERRLA, EOMI, no exophthalmos ENT: moist mucous membranes, no thyromegaly, no nodule palpated in neck, no cervical lymphadenopathy; mallampati 3.5 (upon questioning, she snores - no formal dx of OSA) Cardiovascular:  RRR, No MRG, minor peri-ankle edema bilat., nonpitting Respiratory: CTA B Gastrointestinal: abdomen soft, NT, ND, BS+ Musculoskeletal: no deformities, strength intact in all 4 Skin: moist, warm, no rashes Neurological: no tremor with outstretched hands, DTR diminished in all 4  Assessment:     1. Hypothyroidism- uncontrolled - on PPI (Nexium) taken along with Levothyroxine (generic) in am for years  2. Thyroid  nodule - per thyroid U/S (03/17/2012) at [facility] new hypoechoic nodule 0.6 x 0.4 x 0.7 cm in R side of isthmus (on a background of heterogeneous hypoechoic lobulated gland, stable). Previous U/S: no nodules, in 2010.    Plan:     1. Pt with uncontrolled hypothyroidism, with very high TSH, insisting that she does take her thyroid medication daily, however, taking it along with the PPI. The PPI (Nexium) is preventing her from absorbing the Levothyroxine, which is likely the cause of her very elevated TSH. - we discussed proper intake of Levothyroxine, >30 min before b'fast, separated by >4h from antacids, calcium, iron, MVI. I advised to take Levothyroxine upon waking up at 6:30, then wait approx. 1h and eat b'fast (do not skip it!) & take the rest of her meds except Nexium. It is best to take Nexium at night. - decreasing her TSH will also help her lose weight and will help with her snoring - she likely has OSA; if not, she needs a sleep study when TSH is normal - we will check a new TSH and fT4 in 6 weeks after separating the fT4 from PPI, will call her with the results - I will see her back in 3 months  2. Small, sub-cm hypoechoic nodule  - pt without increased risk of thyroid cancer (no FH or personal h/o radiation to head and neck) but with a high TSH for 4 years.  - explained that even if this is thyroid cancer, this is very indolent and will not evolve in another year - I suggested to get another U/S in a year and decide then whether we need to biopsy the nodule. I would not recommend this for now.  - We will try to obtain the images of her recent U/S.

## 2012-03-25 NOTE — Patient Instructions (Addendum)
Please take your Synthroid >30 min before breakfast, separated by >4h from antacids (Nexium, Protonix, Prilosec, etc.), calcium (e.g. Tums), iron, multivitamins. We will check a new TSH and fT4 in 6 weeks and will let you know the results. Please return in approx. 3 months. Our tel. nr. is [phone], please call us with any questions.

## 2012-06-05 ENCOUNTER — Telehealth: Payer: Self-pay | Admitting: Internal Medicine

## 2012-06-05 NOTE — Telephone Encounter (Signed)
Pt cancelled her appt for next week. 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 She will call later to r/s if needed / Sherri S.

## 2012-06-06 NOTE — Telephone Encounter (Signed)
Ok

## 2012-06-10 ENCOUNTER — Ambulatory Visit: Payer: Medicare Other | Admitting: Internal Medicine

## 2012-08-13 DIAGNOSIS — R0602 Shortness of breath: Secondary | ICD-10-CM

## 2013-02-25 ENCOUNTER — Other Ambulatory Visit: Payer: Self-pay | Admitting: Pharmacist

## 2013-03-03 ENCOUNTER — Ambulatory Visit: Payer: Medicare Other | Admitting: Internal Medicine

## 2013-03-04 ENCOUNTER — Emergency Department (HOSPITAL_COMMUNITY): Payer: Medicare Other

## 2013-03-04 ENCOUNTER — Encounter (HOSPITAL_COMMUNITY): Payer: Self-pay | Admitting: Emergency Medicine

## 2013-03-04 ENCOUNTER — Emergency Department (HOSPITAL_COMMUNITY)
Admission: EM | Admit: 2013-03-04 | Discharge: 2013-03-04 | Disposition: A | Discharge status: Home / Self Care | Payer: Medicare Other | Attending: Emergency Medicine | Admitting: Emergency Medicine

## 2013-03-04 DIAGNOSIS — F3289 Other specified depressive episodes: Secondary | ICD-10-CM | POA: Insufficient documentation

## 2013-03-04 DIAGNOSIS — M79609 Pain in unspecified limb: Secondary | ICD-10-CM | POA: Insufficient documentation

## 2013-03-04 DIAGNOSIS — R110 Nausea: Secondary | ICD-10-CM | POA: Insufficient documentation

## 2013-03-04 DIAGNOSIS — M25512 Pain in left shoulder: Secondary | ICD-10-CM

## 2013-03-04 DIAGNOSIS — F329 Major depressive disorder, single episode, unspecified: Secondary | ICD-10-CM | POA: Insufficient documentation

## 2013-03-04 DIAGNOSIS — J45909 Unspecified asthma, uncomplicated: Secondary | ICD-10-CM | POA: Insufficient documentation

## 2013-03-04 DIAGNOSIS — F17200 Nicotine dependence, unspecified, uncomplicated: Secondary | ICD-10-CM | POA: Insufficient documentation

## 2013-03-04 DIAGNOSIS — I10 Essential (primary) hypertension: Secondary | ICD-10-CM | POA: Insufficient documentation

## 2013-03-04 DIAGNOSIS — Z791 Long term (current) use of non-steroidal anti-inflammatories (NSAID): Secondary | ICD-10-CM | POA: Insufficient documentation

## 2013-03-04 DIAGNOSIS — Z79899 Other long term (current) drug therapy: Secondary | ICD-10-CM | POA: Insufficient documentation

## 2013-03-04 DIAGNOSIS — M25519 Pain in unspecified shoulder: Secondary | ICD-10-CM | POA: Insufficient documentation

## 2013-03-04 DIAGNOSIS — E079 Disorder of thyroid, unspecified: Secondary | ICD-10-CM | POA: Insufficient documentation

## 2013-03-04 DIAGNOSIS — G8929 Other chronic pain: Secondary | ICD-10-CM | POA: Insufficient documentation

## 2013-03-04 DIAGNOSIS — R079 Chest pain, unspecified: Secondary | ICD-10-CM

## 2013-03-04 DIAGNOSIS — R51 Headache: Secondary | ICD-10-CM | POA: Insufficient documentation

## 2013-03-04 DIAGNOSIS — R0789 Other chest pain: Secondary | ICD-10-CM | POA: Insufficient documentation

## 2013-03-04 LAB — CBC WITH DIFFERENTIAL/PLATELET
Basophils Absolute: 0.1 10*3/uL (ref 0.0–0.1)
Basophils Relative: 1 % (ref 0–1)
Eosinophils Absolute: 0.3 10*3/uL (ref 0.0–0.7)
Eosinophils Relative: 2 % (ref 0–5)
HCT: 41 % (ref 36.0–46.0)
Hemoglobin: 13.7 g/dL (ref 12.0–15.0)
Lymphocytes Relative: 42 % (ref 12–46)
Lymphs Abs: 4.7 10*3/uL — ABNORMAL HIGH (ref 0.7–4.0)
MCH: 31.5 pg (ref 26.0–34.0)
MCHC: 33.4 g/dL (ref 30.0–36.0)
MCV: 94.3 fL (ref 78.0–100.0)
Monocytes Absolute: 0.8 10*3/uL (ref 0.1–1.0)
Monocytes Relative: 7 % (ref 3–12)
Neutro Abs: 5.3 10*3/uL (ref 1.7–7.7)
Neutrophils Relative %: 48 % (ref 43–77)
Platelets: 253 10*3/uL (ref 150–400)
RBC: 4.35 MIL/uL (ref 3.87–5.11)
RDW: 13.2 % (ref 11.5–15.5)
WBC: 11.1 10*3/uL — ABNORMAL HIGH (ref 4.0–10.5)

## 2013-03-04 LAB — POCT I-STAT TROPONIN I
Troponin i, poc: 0 ng/mL (ref 0.00–0.08)
Troponin i, poc: 0 ng/mL (ref 0.00–0.08)

## 2013-03-04 LAB — BASIC METABOLIC PANEL
BUN: 9 mg/dL (ref 6–23)
CO2: 22 mEq/L (ref 19–32)
Calcium: 9.2 mg/dL (ref 8.4–10.5)
Chloride: 104 mEq/L (ref 96–112)
Creatinine, Ser: 0.77 mg/dL (ref 0.50–1.10)
GFR calc Af Amer: >90 mL/min (ref >90)
GFR calc non Af Amer: >90 mL/min (ref >90)
Glucose, Bld: 88 mg/dL (ref 70–99)
Potassium: 4 mEq/L (ref 3.5–5.1)
Sodium: 140 mEq/L (ref 135–145)

## 2013-03-04 MED ORDER — ASPIRIN 81 MG PO CHEW
324.0000 mg | CHEWABLE_TABLET | Freq: Once | ORAL | Status: AC
  Administered 2013-03-04: 324 mg via ORAL
  Filled 2013-03-04: qty 4

## 2013-03-04 MED ORDER — OXYCODONE-ACETAMINOPHEN 5-325 MG PO TABS
2.0000 | ORAL_TABLET | Freq: Once | ORAL | Status: AC
  Administered 2013-03-04: 1 via ORAL
  Filled 2013-03-04: qty 2

## 2013-03-04 NOTE — ED Notes (Signed)
Pt states understanding of discharge instructions 

## 2013-03-04 NOTE — ED Provider Notes (Signed)
CSN: [id number]     Arrival date & time 03/04/13  1823 History   First MD Initiated Contact with Patient 03/04/13 2134     Chief Complaint  Patient presents with  . Chest Pain   HPI  History provided by the patient. The patient is a 47 year old female with history of emphysema, hypertension and depression who presents with complaints of episodes of chest pain and left arm pain. Patient reports that she has had some issues of chest pain recently. She was seen by her primary care provider earlier today and did discuss the symptoms. She states that she was told she was under too much stress which she admits has been caused from her mother being in the hospital and the holidays. Her mother recently had surgery and while coming to visit her this evening she had a return of her chest pressure and tightness located in the left chest and sternum area. Symptoms lasted approximately 40 minutes with pain radiating into the left arm. After arriving to the hospital her chest pain was better however her arm continues to be painful and sore. It is worse with movements or palpation. Patient did not use any medications or treatment for her symptoms. She reports arm pain has caused slight nausea. Denies any diaphoresis. She denies any shortness of breath. Symptoms are also associated with diffuse frontal headache. No other aggravating or alleviating factors. No other associated symptoms.    Past Medical History  Diagnosis Date  . Asthma   . Depression   . Emphysema   . Chronic headaches   . Emphysema of lung   . Hypertension   . Thyroid disease    Past Surgical History  Procedure Laterality Date  . Back surgery    . Appendectomy     Family History  Problem Relation Age of Onset  . Hypertension Mother   . Hypertension Father   . Heart disease Maternal Grandfather   . Heart disease Paternal Grandmother    History  Substance Use Topics  . Smoking status: Current Some Day Smoker -- 0.50 packs/day     Types: Cigarettes  . Smokeless tobacco: Not on file  . Alcohol Use: Yes   OB History   Grav Para Term Preterm Abortions TAB SAB Ect Mult Living                 Review of Systems  Constitutional: Negative for fever and diaphoresis.  Respiratory: Negative for cough and shortness of breath.   Cardiovascular: Positive for chest pain.  Gastrointestinal: Positive for nausea. Negative for vomiting.  Neurological: Positive for headaches.  All other systems reviewed and are negative.    Allergies  Influenza vaccines; Pneumococcal vaccines; Amoxicillin; Codeine; Hydromorphone hcl; Sulfonamide derivatives; and Hydrocodone-acetaminophen  Home Medications   Current Outpatient Rx  Name  Route  Sig  Dispense  Refill  . ALPRAZolam (XANAX) 1 MG tablet   Oral   Take 1 mg by mouth 4 (four) times daily.          Marland Kitchen esomeprazole (NEXIUM) 40 MG capsule   Oral   Take 40 mg by mouth 2 (two) times daily as needed (takes 1 tablet daily, then 1 addt'l tablet if needed).          . furosemide (LASIX) 20 MG tablet   Oral   Take 20 mg by mouth daily.         Marland Kitchen ibuprofen (ADVIL,MOTRIN) 200 MG tablet   Oral   Take 800 mg by mouth  every 6 (six) hours as needed.         Marland Kitchen levothyroxine (SYNTHROID, LEVOTHROID) 200 MCG tablet   Oral   Take 200 mcg by mouth daily.         . meloxicam (MOBIC) 15 MG tablet   Oral   Take 15 mg by mouth daily.         . QUEtiapine (SEROQUEL) 300 MG tablet   Oral   Take 300 mg by mouth at bedtime.         Marland Kitchen rOPINIRole (REQUIP) 3 MG tablet   Oral   Take 3-6 mg by mouth at bedtime. Takes 3mg  at bedtime, then takes addt'l 3mg  as needed         . sertraline (ZOLOFT) 50 MG tablet   Oral   Take 100 mg by mouth daily.          . simvastatin (ZOCOR) 20 MG tablet   Oral   Take 20 mg by mouth daily at 6 PM.          BP 136/90  Pulse 78  Temp(Src) 98 F (36.7 C) (Oral)  Resp 18  SpO2 100% Physical Exam  Nursing note and vitals  reviewed. Constitutional: She is oriented to person, place, and time. She appears well-developed and well-nourished. No distress.  HENT:  Head: Normocephalic.  Eyes: Conjunctivae and EOM are normal. Pupils are equal, round, and reactive to light.  Neck: Normal range of motion. Neck supple.  No cervical midline tenderness  Cardiovascular: Normal rate and regular rhythm.   Pulmonary/Chest: Effort normal and breath sounds normal. No respiratory distress. She has no wheezes. She has no rales.  Abdominal: Soft. There is no tenderness. There is no rebound and no guarding.  Obese  Musculoskeletal:  Pain with palpation over the left shoulder area and and trapezius. She also has pain with with range of motion. No deformities. Normal distal pulses, good strength and sensations in the hand.  Neurological: She is alert and oriented to person, place, and time. She has normal strength. No cranial nerve deficit or sensory deficit.  Skin: Skin is warm and dry. No rash noted.  Psychiatric: She has a normal mood and affect. Her behavior is normal.    ED Course  Procedures  DIAGNOSTIC STUDIES: Oxygen Saturation is 100% on room air.    COORDINATION OF CARE:  Nursing notes reviewed. Vital signs reviewed. Initial pt interview and examination performed.   10:22 PM-patient seen and evaluated. She is resting appears comfortable but is holding her left arm. Does not appear in any acute distress. Discussed work up plan with pt at bedside, which includes lab testing, EKG and chest x-ray. We'll also plan to give medications to help with pain and arm. Pt agrees with plan.  Pt discussed with Attending Physician.  Pt is has HEART score of 3 and low risk. Negative troponin x2.At this time do not feel pt has ACS.  Patient will followup with PCP and given strict return precautions.  Treatment plan initiated: Medications  aspirin chewable tablet 324 mg (324 mg Oral Given 03/04/13 2231)  oxyCODONE-acetaminophen  (PERCOCET/ROXICET) 5-325 MG per tablet 2 tablet (1 tablet Oral Given 03/04/13 2232)    Results for orders placed during the hospital encounter of 03/04/13  BASIC METABOLIC PANEL      Result Value Range   Sodium 140  135 - 145 mEq/L   Potassium 4.0  3.5 - 5.1 mEq/L   Chloride 104  96 - 112 mEq/L  CO2 22  19 - 32 mEq/L   Glucose, Bld 88  70 - 99 mg/dL   BUN 9  6 - 23 mg/dL   Creatinine, Ser 1.61  0.50 - 1.10 mg/dL   Calcium 9.2  8.4 - 09.6 mg/dL   GFR calc non Af Amer >90  >90 mL/min   GFR calc Af Amer >90  >90 mL/min  CBC WITH DIFFERENTIAL      Result Value Range   WBC 11.1 (*) 4.0 - 10.5 K/uL   RBC 4.35  3.87 - 5.11 MIL/uL   Hemoglobin 13.7  12.0 - 15.0 g/dL   HCT 04.5  40.9 - 81.1 %   MCV 94.3  78.0 - 100.0 fL   MCH 31.5  26.0 - 34.0 pg   MCHC 33.4  30.0 - 36.0 g/dL   RDW 91.4  78.2 - 95.6 %   Platelets 253  150 - 400 K/uL   Neutrophils Relative % 48  43 - 77 %   Neutro Abs 5.3  1.7 - 7.7 K/uL   Lymphocytes Relative 42  12 - 46 %   Lymphs Abs 4.7 (*) 0.7 - 4.0 K/uL   Monocytes Relative 7  3 - 12 %   Monocytes Absolute 0.8  0.1 - 1.0 K/uL   Eosinophils Relative 2  0 - 5 %   Eosinophils Absolute 0.3  0.0 - 0.7 K/uL   Basophils Relative 1  0 - 1 %   Basophils Absolute 0.1  0.0 - 0.1 K/uL  POCT I-STAT TROPONIN I      Result Value Range   Troponin i, poc 0.00  0.00 - 0.08 ng/mL   Comment 3           POCT I-STAT TROPONIN I      Result Value Range   Troponin i, poc 0.00  0.00 - 0.08 ng/mL   Comment 3               Imaging Review Dg Chest 2 View  03/04/2013   CLINICAL DATA:  Chest pain radiating to the left arm.  EXAM: CHEST  2 VIEW  COMPARISON:  12/07/2011.  FINDINGS: Trachea is midline. Heart size normal. Lungs are clear. No pleural fluid. Right 1st and 2nd ribs appear partially fused.  IMPRESSION: No acute findings.   Electronically Signed   By: Leanna Battles M.D.   On: 03/04/2013 19:07    EKG Interpretation   None       Date: 03/04/2013  Rate: 89  Rhythm:  normal sinus rhythm  QRS Axis: normal  Intervals: normal  ST/T Wave abnormalities: nonspecific ST/T changes  Conduction Disutrbances:none  Narrative Interpretation:   Old EKG Reviewed: none available      MDM   1. Chest pain   2. Shoulder pain, left        Angus Seller, PA-C 03/04/13 2310

## 2013-03-04 NOTE — ED Notes (Signed)
Pt sts came from upstairs with her mother c/o CP and left arm pain; pt seen at PCP today for similar and told was panic attack and stress

## 2013-03-04 NOTE — Discharge Instructions (Signed)
You were seen and evaluated for your continued left arm pain and episode of chest pain. Your lab testing, chest x-ray and EKG of your heart have not shown any immediate signs for concerning or emergent condition to explain your symptoms. Please followup with your primary care provider for continued evaluation and treatment. Return for any worsening symptoms.    Chest Pain (Nonspecific) It is often hard to give a specific diagnosis for the cause of chest pain. There is always a chance that your pain could be related to something serious, such as a heart attack or a blood clot in the lungs. You need to follow up with your caregiver for further evaluation. CAUSES   Heartburn.  Pneumonia or bronchitis.  Anxiety or stress.  Inflammation around your heart (pericarditis) or lung (pleuritis or pleurisy).  A blood clot in the lung.  A collapsed lung (pneumothorax). It can develop suddenly on its own (spontaneous pneumothorax) or from injury (trauma) to the chest.  Shingles infection (herpes zoster virus). The chest wall is composed of bones, muscles, and cartilage. Any of these can be the source of the pain.  The bones can be bruised by injury.  The muscles or cartilage can be strained by coughing or overwork.  The cartilage can be affected by inflammation and become sore (costochondritis). DIAGNOSIS  Lab tests or other studies, such as X-rays, electrocardiography, stress testing, or cardiac imaging, may be needed to find the cause of your pain.  TREATMENT   Treatment depends on what may be causing your chest pain. Treatment may include:  Acid blockers for heartburn.  Anti-inflammatory medicine.  Pain medicine for inflammatory conditions.  Antibiotics if an infection is present.  You may be advised to change lifestyle habits. This includes stopping smoking and avoiding alcohol, caffeine, and chocolate.  You may be advised to keep your head raised (elevated) when sleeping. This  reduces the chance of acid going backward from your stomach into your esophagus.  Most of the time, nonspecific chest pain will improve within 2 to 3 days with rest and mild pain medicine. HOME CARE INSTRUCTIONS   If antibiotics were prescribed, take your antibiotics as directed. Finish them even if you start to feel better.  For the next few days, avoid physical activities that bring on chest pain. Continue physical activities as directed.  Do not smoke.  Avoid drinking alcohol.  Only take over-the-counter or prescription medicine for pain, discomfort, or fever as directed by your caregiver.  Follow your caregiver's suggestions for further testing if your chest pain does not go away.  Keep any follow-up appointments you made. If you do not go to an appointment, you could develop lasting (chronic) problems with pain. If there is any problem keeping an appointment, you must call to reschedule. SEEK MEDICAL CARE IF:   You think you are having problems from the medicine you are taking. Read your medicine instructions carefully.  Your chest pain does not go away, even after treatment.  You develop a rash with blisters on your chest. SEEK IMMEDIATE MEDICAL CARE IF:   You have increased chest pain or pain that spreads to your arm, neck, jaw, back, or abdomen.  You develop shortness of breath, an increasing cough, or you are coughing up blood.  You have severe back or abdominal pain, feel nauseous, or vomit.  You develop severe weakness, fainting, or chills.  You have a fever. THIS IS AN EMERGENCY. Do not wait to see if the pain will go away. Get  medical help at once. Call your local emergency services (911 in [place] Do not drive yourself to the hospital. MAKE SURE YOU:   Understand these instructions.  Will watch your condition.  Will get help right away if you are not doing well or get worse. Document Released: 01/03/2005 Document Revised: 06/18/2011 Document Reviewed:  10/30/2007 [facility] Patient Information 2014 [place], [place].

## 2013-03-05 NOTE — ED Provider Notes (Signed)
Medical screening examination/treatment/procedure(s) were performed by non-physician practitioner and as supervising physician I was immediately available for consultation/collaboration.  EKG Interpretation    Date/Time:  Wednesday March 04 2013 18:28:00 EST Ventricular Rate:  89 PR Interval:  160 QRS Duration: 86 QT Interval:  394 QTC Calculation: 479 R Axis:   -9 Text Interpretation:  Normal sinus rhythm No significant change since last tracing Confirmed by Ermalee Mealy  MD, Dareth Andrew ([id number]) on 03/04/2013 11:00:52 PM              Randa Spike Mort Sawyers, MD 03/05/13 1620

## 2013-04-09 HISTORY — PX: CYST REMOVAL HAND: SHX6279

## 2013-07-08 HISTORY — PX: COLONOSCOPY W/ POLYPECTOMY: SHX1380

## 2013-08-10 ENCOUNTER — Encounter: Payer: Self-pay | Admitting: Internal Medicine

## 2013-08-10 ENCOUNTER — Ambulatory Visit (INDEPENDENT_AMBULATORY_CARE_PROVIDER_SITE_OTHER): Payer: Medicare Other | Admitting: Internal Medicine

## 2013-08-10 VITALS — BP 118/78 | HR 80 | Temp 98.6°F | Resp 12 | Wt 217.0 lb

## 2013-08-10 DIAGNOSIS — E039 Hypothyroidism, unspecified: Secondary | ICD-10-CM

## 2013-08-10 DIAGNOSIS — E041 Nontoxic single thyroid nodule: Secondary | ICD-10-CM

## 2013-08-10 MED ORDER — SYNTHROID 112 MCG PO TABS: 224.0000 ug | ORAL_TABLET | Freq: Every day | ORAL | Status: DC

## 2013-08-10 NOTE — Progress Notes (Signed)
Subjective:     [name], female   DOB: February 12, 1966, 48 y.o.   MRN: [id number]  HPI Ms. [name] is a pleasant 48 y.o. woman initially referred by her PCP, PA-C Jamal Maes in [place], for management of uncontrolled hypothyroidism and a thyroid nodule. Last visit was 1.5 years ago.  Uncontrolled hypothyroidism:  Reviewed hx: Pt. was dx with hypothyroidism approx 12 years ago. She was initially seen by Dr. Loanne Drilling. TSH was lastly normal in 2009, but elevated since then:  08/03/2013: TSH 177 Lab Results  Component Value Date   TSH 98.50 03/01/2010   TSH 59.48 12/22/2009   TSH 60.06 06/29/2009   TSH 30.46 04/25/2009   TSH 0.40 01/26/2008   TSH 14.377* 12/18/2007   TSH 44.27* 12/05/2007   TSH 1.71 04/09/2007   TSH 6.06* 10/04/2006   TSH 4.98 05/03/2006   FREET4 0.23 03/01/2010   FREET4 0.35 12/22/2009   FREET4 1.1 06/29/2009   FREET4 1.2 04/25/2009   FREET4 0.7 01/26/2008   At last visit, she was taking generic Levothyroxine 225 mcg daily, increased from 200 by PCP when TSH 169. Since last visit, she had a low TSH! (no records) >> her dose was decreased temporarily to 175 mcg.  She always denied missing doses. She was taking it in am, along with all her medicines, including Nexium! We discussed how to take the LT4 correctly and scheduled her to come back for labs >> did not return until now.   She now takes Levothyroxine 225 mcg (increased from 200 mcg last week) - does not miss doses: - fasting - with water - does not eat breakfast - she takes Nexium at night - she takes the Zoloft   She c/o: - fatigue - diarrhea - insomnia  Thyroid nodule:   She had a thyroid U/S in 12/2007 showing no nodules or suspicious areas but inhomogeneous throughout c/w thyroiditis.  She had a thyroid U/S on 03/17/12 at [facility]: new hypoechoic nodule 0.6 x 0.4 x 0.7 cm in R side of isthmus (on a background of heterogeneous hypoechoic lobulated gland, stable).  No neck  compression spx.   She has a FH of thyroid ds. in brother (hyperthyroidism). No FH of thyroid cancer. No h/o radiation to head/neck.   She has a h/o sarcoidosis. Her LFTs were elevated and saw GI. I do not have these results.  I reviewed pt's medications, allergies, PMH, social hx, family hx and no changes required, except as mentioned above. She had left hand surgery since last visit.   Review of Systems Constitutional: + weight gain, + fatigue, and both subjective hyperthermia/hypothermia; + poor sleep Eyes: no blurry vision, no xerophthalmia, but watery eyes ENT: no sore throat, no nodules palpated in throat, no dysphagia/odynophagia, raspy voice for 1 mo Cardiovascular: no CP/SOB/palpitations, but has leg swelling Respiratory: no cough/SOB Gastrointestinal: no N/V/C, has constant diarrhea, usually after eating (not immediately after taking the LT4 Musculoskeletal: + muscle/no joint aches Skin: no rashes, + hair loss, + easy bruising Neurological: no tremors/numbness/tingling/dizziness, + HA Psychiatric: no depression, but has anxiety + low libido   Objective:   Physical Exam BP 118/78  Pulse 80  Temp(Src) 98.6 F (37 C) (Oral)  Resp 12  Wt 217 lb (98.431 kg)  SpO2 98%Body mass index is 37.23 kg/(m^2).  Wt Readings from Last 3 Encounters:  08/10/13 217 lb (98.431 kg)  03/25/12 213 lb (96.616 kg)  03/10/10 195 lb 4 oz (88.565 kg)  Constitutional: overweight, in NAD,  but anxious Eyes: PERRLA, EOMI, no exophthalmos ENT: moist mucous membranes, no thyromegaly, no nodule palpated in neck, no cervical lymphadenopathy; mallampati 3.5  Cardiovascular: RRR, No MRG, minor peri-ankle edema bilat., nonpitting Respiratory: CTA B Gastrointestinal: abdomen soft, NT, ND, BS+ Musculoskeletal: no deformities, strength intact in all 4 Skin: moist, warm, no rashes Neurological: no tremor with outstretched hands, DTR diminished in all 4  Assessment:     1. Hypothyroidism-  uncontrolled  2. Thyroid nodule - per thyroid U/S (03/17/2012) at [facility] new hypoechoic nodule 0.6 x 0.4 x 0.7 cm in R side of isthmus (on a background of heterogeneous hypoechoic lobulated gland, stable). Previous U/S: no nodules, in 2009.    Plan:     1. Pt with uncontrolled hypothyroidism, with very high TSH, insisting that she does take her thyroid medication daily. She is now separating the PPI from LT4, but despite this, her TSH is 177! Since she apparently had a low TSH in the last year >> her LT4 needed to be decreased >> I believe her LT4 absorption is not reduced. I still suspect non- or intermittent compliance  - we again discussed proper intake of Levothyroxine, >30 min before b'fast (she does not eat b'fast), separated by >4h from antacids, calcium, iron, MVI.Takes Nexium at night - will start brand name Synthroid - we will check a new TSH and fT4 in 5 weeks  - I will see her back in 3 months  2. Small, sub-cm hypoechoic nodule  - pt without increased risk of thyroid cancer (no FH or personal h/o radiation to head and neck) but with a high TSH for 5 years.  - will check a new thyroid U/S when she comes back for labs in 5 weeks.

## 2013-08-10 NOTE — Patient Instructions (Signed)
Please come back for labs in 5 weeks and in 3 months for a visit.  Please schedule the thyroid ultrasound then, too.  Take the Synthroid EVERY DAY, with water, separated by >4h from anti acid medication, calcium, iron, multivitamins and >30 min from food.

## 2013-09-14 ENCOUNTER — Other Ambulatory Visit (INDEPENDENT_AMBULATORY_CARE_PROVIDER_SITE_OTHER): Payer: Medicare Other

## 2013-09-14 ENCOUNTER — Encounter (INDEPENDENT_AMBULATORY_CARE_PROVIDER_SITE_OTHER): Payer: Self-pay

## 2013-09-14 ENCOUNTER — Ambulatory Visit
Admission: RE | Admit: 2013-09-14 | Discharge: 2013-09-14 | Disposition: A | Discharge status: Home / Self Care | Payer: Medicare Other | Source: Ambulatory Visit | Attending: Internal Medicine | Admitting: Internal Medicine

## 2013-09-14 ENCOUNTER — Telehealth: Payer: Self-pay | Admitting: Internal Medicine

## 2013-09-14 DIAGNOSIS — E039 Hypothyroidism, unspecified: Secondary | ICD-10-CM

## 2013-09-14 DIAGNOSIS — E041 Nontoxic single thyroid nodule: Secondary | ICD-10-CM

## 2013-09-14 LAB — T4, FREE: Free T4: 1.21 ng/dL (ref 0.60–1.60)

## 2013-09-14 LAB — TSH: TSH: 1.23 u[IU]/mL (ref 0.35–4.50)

## 2013-09-14 NOTE — Telephone Encounter (Signed)
Please read note below and advise.  

## 2013-09-14 NOTE — Telephone Encounter (Signed)
Pt states her hair is coming out in clumps please advise she came in for labs today

## 2013-09-15 NOTE — Telephone Encounter (Signed)
TFTs were great - not thyroid related.

## 2013-10-06 ENCOUNTER — Telehealth: Payer: Self-pay | Admitting: Internal Medicine

## 2013-10-06 NOTE — Telephone Encounter (Signed)
Chart reviewed for Recall Assesment-Transferred GI [facility] to [facility] 06-15-13/yf

## 2013-11-10 ENCOUNTER — Ambulatory Visit: Payer: Medicare Other | Admitting: Internal Medicine

## 2014-05-13 DIAGNOSIS — G259 Extrapyramidal and movement disorder, unspecified: Secondary | ICD-10-CM | POA: Diagnosis not present

## 2014-05-13 DIAGNOSIS — E782 Mixed hyperlipidemia: Secondary | ICD-10-CM | POA: Diagnosis not present

## 2014-05-13 DIAGNOSIS — I10 Essential (primary) hypertension: Secondary | ICD-10-CM | POA: Diagnosis not present

## 2014-05-13 DIAGNOSIS — F329 Major depressive disorder, single episode, unspecified: Secondary | ICD-10-CM | POA: Diagnosis not present

## 2014-05-13 DIAGNOSIS — E039 Hypothyroidism, unspecified: Secondary | ICD-10-CM | POA: Diagnosis not present

## 2014-05-17 ENCOUNTER — Ambulatory Visit (INDEPENDENT_AMBULATORY_CARE_PROVIDER_SITE_OTHER): Payer: Medicare Other | Admitting: Internal Medicine

## 2014-05-17 ENCOUNTER — Encounter: Payer: Self-pay | Admitting: Internal Medicine

## 2014-05-17 VITALS — BP 108/62 | HR 77 | Temp 97.7°F | Resp 12 | Wt 229.0 lb

## 2014-05-17 DIAGNOSIS — E039 Hypothyroidism, unspecified: Secondary | ICD-10-CM

## 2014-05-17 MED ORDER — LEVOTHYROXINE SODIUM 112 MCG PO TABS: 224.0000 ug | ORAL_TABLET | Freq: Every day | ORAL | Status: DC

## 2014-05-17 NOTE — Progress Notes (Signed)
Subjective:     [name], female   DOB: 11-13-1965, 49 y.o.   MRN: [id number]  HPI Katherine Hayden is a pleasant 49 y.o. woman initially referred by her PCP, PA-C Katherine Hayden in [place], for management of uncontrolled hypothyroidism and a thyroid nodule. Last visit was 6 mo ago. Her sister accompanies her today and offers part of the hx.  Uncontrolled hypothyroidism:  Reviewed hx: Pt. was dx with hypothyroidism approx 12 years ago. She was initially seen by Dr. Loanne Drilling. TFTs very uncontrolled along the years.  She was taking generic Levothyroxine 225 mcg daily, increased from 200 by PCP when TSH 169. On this dose, in 09/2013, her TSH was great, at 1.23.  Reviewed TFTs:   05/14/2014: TSH received from PCP: 196.1 Lab Results  Component Value Date   TSH 1.23 09/14/2013   TSH 98.50 03/01/2010   TSH 59.48 12/22/2009   TSH 60.06 06/29/2009   TSH 30.46 04/25/2009   TSH 0.40 01/26/2008   FREET4 1.21 09/14/2013   FREET4 0.23 03/01/2010   FREET4 0.35 12/22/2009   FREET4 1.1 06/29/2009   FREET4 1.2 04/25/2009   FREET4 0.7 01/26/2008  08/03/2013: TSH 177  She always denied missing doses, however, now she mentions she may have skipped "a couple of weeks".  She now takes Levothyroxine 225 mcg: - fasting - with water - does not eat breakfast - she takes Nexium at night  She c/o: - fatigue - diarrhea - weight gain - swelling - also see ROS  Thyroid nodule:  She had a thyroid U/S in 12/2007 showing no nodules or suspicious areas but inhomogeneous throughout c/w thyroiditis.  She had a thyroid U/S on 03/17/12 at [facility]: new hypoechoic nodule 0.6 x 0.4 x 0.7 cm in R side of isthmus (on a background of heterogeneous hypoechoic lobulated gland, stable).  No neck compression spx.   She has a h/o sarcoidosis.   I reviewed pt's medications, allergies, PMH, social hx, family hx, and changes were documented in the history of present illness. Otherwise,  unchanged from my initial visit note.  Review of Systems Constitutional: + weight gain, + fatigue, + subjective hypothermia; + nocturia Eyes: + blurry vision, no xerophthalmia, but watery eyes ENT: no sore throat, no nodules palpated in throat, no dysphagia/odynophagia, +++ hoarseness Cardiovascular: no CP/+ SOB/+ feet and hands  swelling Respiratory: no cough/+ SOB Gastrointestinal: no N/V/C, has constant diarrhea, usually after eating (not immediately after taking the LT4 Musculoskeletal: + muscle/no joint aches Skin: no rashes, + hair loss Neurological: + tremors/no numbness/tingling/dizziness, + HA + low libido   Objective:   Physical Exam BP 108/62 mmHg  Pulse 77  Temp(Src) 97.7 F (36.5 C) (Oral)  Resp 12  Wt 229 lb (103.874 kg)  SpO2 96%Body mass index is 39.29 kg/(m^2).  Wt Readings from Last 3 Encounters:  05/17/14 229 lb (103.874 kg)  08/10/13 217 lb (98.431 kg)  03/25/12 213 lb (96.616 kg)  Constitutional: overweight, in NAD, palpebral and periocular edema Eyes: PERRLA, EOMI, no exophthalmos ENT: moist mucous membranes, no thyromegaly, no nodule palpated in neck, no cervical lymphadenopathy; mallampati 3.5  Cardiovascular: RRR, No MRG, no LE edema Respiratory: CTA B Gastrointestinal: abdomen soft, NT, ND, BS+ Musculoskeletal: no deformities, strength intact in all 4 Skin: moist, warm, no rashes Neurological: no tremor with outstretched hands, DTR diminished in all 4  Assessment:     1. Hypothyroidism- uncontrolled 2/2 noncompliance  2. Thyroid nodule - per thyroid U/S (03/17/2012) at [facility]  new hypoechoic nodule 0.6 x 0.4 x 0.7 cm in R side of isthmus (on a background of heterogeneous hypoechoic lobulated gland, stable).  - Previous U/S (2009): no nodules    Plan:     1. Pt with uncontrolled hypothyroidism, with very high TSH, after a normal TSH in 09/2013 on the same dose. She admits that she did not take the med for "a couple of weeks"... Since  she had a normal TSH in the last year >> her LT4 absorption is not reduced, she just needs to take the medication daily.  I explained that since now we know that she can get to a normal TSH by taking her LT4 every day, if she decides not to take it >> I cannot help control her hypothyroidism and will not continue to see her. - we discussed again consequences of uncontrolled hypothyroidism to include: pericarditis, strokes, AMI, dementia, but I also advised her that she will not be able to drive if her TSH stays this high. She agrees to start taking it as advised. - we again discussed proper intake of Levothyroxine, >30 min before b'fast (she does not eat b'fast), separated by >4h from antacids, calcium, iron, MVI.Takes Nexium at night. I strongly advised the pt to put the LT4 bottle on the nightstand and take it first thing in am. - will switch back to generic LT4 for affordability >> refilled. - we will check a new TSH and fT4 in 8 weeks  - I will see her back in 6 months  2. Small, sub-cm hypoechoic nodule  - pt without increased risk of thyroid cancer (no FH or personal h/o radiation to head and neck) but with a high TSH for 5 years.  - will check a new thyroid U/S at next visit.  - time spent with the patient: 40 min, of which >50% was spent in reviewing her thyroid labs with her and her sister, reviewing her dose of LT4 and how she takes it, explaining correct dosing, trying to enforce compliance and discussing possible SEs of omitting the LT4.

## 2014-05-17 NOTE — Patient Instructions (Signed)
Please come back in 2 months for labs.  Please come back for a follow-up appointment in 6 months.  It is very important to take the thyroid hormone every day, with water, >30 minutes before breakfast, separated by >4 hours from acid reflux medications, calcium, iron, multivitamins.

## 2014-05-22 DIAGNOSIS — H02843 Edema of right eye, unspecified eyelid: Secondary | ICD-10-CM | POA: Diagnosis not present

## 2014-06-18 DIAGNOSIS — R110 Nausea: Secondary | ICD-10-CM | POA: Diagnosis not present

## 2014-06-18 DIAGNOSIS — L659 Nonscarring hair loss, unspecified: Secondary | ICD-10-CM | POA: Diagnosis not present

## 2014-06-18 DIAGNOSIS — E039 Hypothyroidism, unspecified: Secondary | ICD-10-CM | POA: Diagnosis not present

## 2014-06-29 ENCOUNTER — Telehealth: Payer: Self-pay | Admitting: Internal Medicine

## 2014-06-29 NOTE — Telephone Encounter (Signed)
Can we get the new TSH from PCP?

## 2014-06-29 NOTE — Telephone Encounter (Signed)
Please read message below and advise.  

## 2014-06-29 NOTE — Telephone Encounter (Signed)
Pt called started the new thyrorid medicine  And on the 12th started feeling really bad she went  PCP  dr and they told her that her thryoid level had bottomed out and PCP changed her medicnie  lavothyorxine 216mcg once a day and pt is still having problems very weak and no energy, no appetite,constant bathroom usage, hair falling out now.

## 2014-06-30 DIAGNOSIS — X58XXXA Exposure to other specified factors, initial encounter: Secondary | ICD-10-CM | POA: Diagnosis not present

## 2014-06-30 DIAGNOSIS — F17200 Nicotine dependence, unspecified, uncomplicated: Secondary | ICD-10-CM | POA: Diagnosis not present

## 2014-06-30 DIAGNOSIS — I10 Essential (primary) hypertension: Secondary | ICD-10-CM | POA: Diagnosis not present

## 2014-06-30 DIAGNOSIS — S161XXA Strain of muscle, fascia and tendon at neck level, initial encounter: Secondary | ICD-10-CM | POA: Diagnosis not present

## 2014-06-30 DIAGNOSIS — Z8249 Family history of ischemic heart disease and other diseases of the circulatory system: Secondary | ICD-10-CM | POA: Diagnosis not present

## 2014-06-30 DIAGNOSIS — M542 Cervicalgia: Secondary | ICD-10-CM | POA: Diagnosis not present

## 2014-06-30 NOTE — Telephone Encounter (Signed)
error 

## 2014-07-01 NOTE — Telephone Encounter (Signed)
Received labs from 06/18/2014 from patient's PCP, Cathlean Sauer, PA-C: - TSH 0.301 (0.45-4.5) At that point, patient was taken to 225 g levothyroxine daily. Patient was instructed to decrease the dose to 200 g levothyroxine daily. I agree with this change. Patient will need to have a repeat set of labs in 5 weeks, either here or in PCPs office (TSH, free T4).

## 2014-07-01 NOTE — Telephone Encounter (Signed)
Called pt and advised her per Dr Gherghe's note. Pt understood.  

## 2014-08-11 DIAGNOSIS — G259 Extrapyramidal and movement disorder, unspecified: Secondary | ICD-10-CM | POA: Diagnosis not present

## 2014-08-11 DIAGNOSIS — F329 Major depressive disorder, single episode, unspecified: Secondary | ICD-10-CM | POA: Diagnosis not present

## 2014-08-11 DIAGNOSIS — E039 Hypothyroidism, unspecified: Secondary | ICD-10-CM | POA: Diagnosis not present

## 2014-08-11 DIAGNOSIS — E782 Mixed hyperlipidemia: Secondary | ICD-10-CM | POA: Diagnosis not present

## 2014-08-11 DIAGNOSIS — I10 Essential (primary) hypertension: Secondary | ICD-10-CM | POA: Diagnosis not present

## 2014-08-11 DIAGNOSIS — M79605 Pain in left leg: Secondary | ICD-10-CM | POA: Diagnosis not present

## 2014-08-17 DIAGNOSIS — Z1231 Encounter for screening mammogram for malignant neoplasm of breast: Secondary | ICD-10-CM | POA: Diagnosis not present

## 2014-08-21 ENCOUNTER — Emergency Department (HOSPITAL_COMMUNITY)
Admission: EM | Admit: 2014-08-21 | Discharge: 2014-08-21 | Disposition: A | Discharge status: Home / Self Care | Payer: Medicare Other | Attending: Emergency Medicine | Admitting: Emergency Medicine

## 2014-08-21 ENCOUNTER — Emergency Department (HOSPITAL_COMMUNITY): Payer: Medicare Other

## 2014-08-21 ENCOUNTER — Encounter (HOSPITAL_COMMUNITY): Payer: Self-pay | Admitting: Emergency Medicine

## 2014-08-21 DIAGNOSIS — G8929 Other chronic pain: Secondary | ICD-10-CM | POA: Insufficient documentation

## 2014-08-21 DIAGNOSIS — Z7951 Long term (current) use of inhaled steroids: Secondary | ICD-10-CM | POA: Insufficient documentation

## 2014-08-21 DIAGNOSIS — Z720 Tobacco use: Secondary | ICD-10-CM | POA: Insufficient documentation

## 2014-08-21 DIAGNOSIS — M791 Myalgia: Secondary | ICD-10-CM | POA: Insufficient documentation

## 2014-08-21 DIAGNOSIS — Z79899 Other long term (current) drug therapy: Secondary | ICD-10-CM | POA: Diagnosis not present

## 2014-08-21 DIAGNOSIS — Z880 Allergy status to penicillin: Secondary | ICD-10-CM | POA: Diagnosis not present

## 2014-08-21 DIAGNOSIS — I10 Essential (primary) hypertension: Secondary | ICD-10-CM | POA: Insufficient documentation

## 2014-08-21 DIAGNOSIS — M25512 Pain in left shoulder: Secondary | ICD-10-CM | POA: Diagnosis not present

## 2014-08-21 DIAGNOSIS — F329 Major depressive disorder, single episode, unspecified: Secondary | ICD-10-CM | POA: Diagnosis not present

## 2014-08-21 DIAGNOSIS — M542 Cervicalgia: Secondary | ICD-10-CM | POA: Diagnosis present

## 2014-08-21 DIAGNOSIS — E079 Disorder of thyroid, unspecified: Secondary | ICD-10-CM | POA: Insufficient documentation

## 2014-08-21 DIAGNOSIS — R52 Pain, unspecified: Secondary | ICD-10-CM | POA: Diagnosis not present

## 2014-08-21 DIAGNOSIS — M2578 Osteophyte, vertebrae: Secondary | ICD-10-CM | POA: Diagnosis not present

## 2014-08-21 DIAGNOSIS — M79602 Pain in left arm: Secondary | ICD-10-CM | POA: Diagnosis not present

## 2014-08-21 DIAGNOSIS — J45909 Unspecified asthma, uncomplicated: Secondary | ICD-10-CM | POA: Insufficient documentation

## 2014-08-21 DIAGNOSIS — M5032 Other cervical disc degeneration, mid-cervical region: Secondary | ICD-10-CM | POA: Diagnosis not present

## 2014-08-21 DIAGNOSIS — M7918 Myalgia, other site: Secondary | ICD-10-CM

## 2014-08-21 DIAGNOSIS — M79603 Pain in arm, unspecified: Secondary | ICD-10-CM | POA: Diagnosis not present

## 2014-08-21 LAB — I-STAT CHEM 8, ED
BUN: 16 mg/dL (ref 6–20)
Calcium, Ion: 1.19 mmol/L (ref 1.12–1.23)
Chloride: 105 mmol/L (ref 101–111)
Creatinine, Ser: 0.6 mg/dL (ref 0.44–1.00)
Glucose, Bld: 108 mg/dL — ABNORMAL HIGH (ref 65–99)
HCT: 42 % (ref 36.0–46.0)
Hemoglobin: 14.3 g/dL (ref 12.0–15.0)
Potassium: 4.1 mmol/L (ref 3.5–5.1)
Sodium: 140 mmol/L (ref 135–145)
TCO2: 20 mmol/L (ref 0–100)

## 2014-08-21 LAB — I-STAT TROPONIN, ED: Troponin i, poc: 0 ng/mL (ref 0.00–0.08)

## 2014-08-21 MED ORDER — OXYCODONE-ACETAMINOPHEN 5-325 MG PO TABS
1.0000 | ORAL_TABLET | Freq: Once | ORAL | Status: AC
  Administered 2014-08-21: 1 via ORAL
  Filled 2014-08-21: qty 1

## 2014-08-21 MED ORDER — NAPROXEN 250 MG PO TABS: 250.0000 mg | ORAL_TABLET | Freq: Two times a day (BID) | ORAL | Status: DC | PRN

## 2014-08-21 MED ORDER — METHOCARBAMOL 500 MG PO TABS: 1000.0000 mg | ORAL_TABLET | Freq: Four times a day (QID) | ORAL | Status: DC | PRN

## 2014-08-21 NOTE — Discharge Instructions (Signed)
°Emergency Department Resource Guide °1) Find a Doctor and Pay Out of Pocket °Although you won't have to find out who is covered by your insurance plan, it is a good idea to ask around and get recommendations. You will then need to call the office and see if the doctor you have chosen will accept you as a new patient and what types of options they offer for patients who are self-pay. Some doctors offer discounts or will set up payment plans for their patients who do not have insurance, but you will need to ask so you aren't surprised when you get to your appointment. ° °2) Contact Your Local Health Department °Not all health departments have doctors that can see patients for sick visits, but many do, so it is worth a call to see if yours does. If you don't know where your local health department is, you can check in your phone book. The CDC also has a tool to help you locate your state's health department, and many state websites also have listings of all of their local health departments. ° °3) Find a Walk-in Clinic °If your illness is not likely to be very severe or complicated, you may want to try a walk in clinic. These are popping up all over the country in pharmacies, drugstores, and shopping centers. They're usually staffed by nurse practitioners or physician assistants that have been trained to treat common illnesses and complaints. They're usually fairly quick and inexpensive. However, if you have serious medical issues or chronic medical problems, these are probably not your best option. ° °No Primary Care Doctor: °- Call [facility] at  832-8000 - they can help you locate a primary care doctor that  accepts your insurance, provides certain services, etc. °- Physician Referral Service- 1-800-533-3463 ° °Chronic Pain Problems: °Organization         Address  Phone   Notes  °[facility]  (336) 297-2271 Patients need to be referred by their primary care doctor.  ° °Medication  Assistance: °Organization         Address  Phone   Notes  °[place] [facility] [address] °[place], [place] [address] (336) 641-8030 --Must be a resident of [place] °-- Must have NO insurance coverage whatsoever (no Medicaid/ Medicare, etc.) °-- The pt. MUST have a primary care doctor that directs their care regularly and follows them in the community °  °[facility]  (866) 331-1348   °[employer]  (888) 892-1162   ° °Agencies that provide inexpensive medical care: °Organization         Address  Phone   Notes  °[facility]  (336) 832-8035   °[facility]    (336) 832-7272   °[facility] [address] °[place], [place] [address] (336) 832-4777   °[facility] [address], °[place] (336) 271-4999   °[facility]    (336) 373-0678   °[facility]    (336) 272-1050   °[facility] ° [address], [place] Phone:  (336) 832-4444, Fax:  (336) 832-4440 Hours of Operation:  9 am - 6 pm, M-F.  Also accepts Medicaid/Medicare and self-pay.  °[facility] ° [address], [place] Phone: (336) 832-3150, Fax: (336) [phone]. Hours of Operation:  8:30 am - 5:30 pm, M-F.  Also accepts Medicaid and self-pay.  °[facility] [place] [address], [place] Phone: (336) 878-6027   °[facility] [address], [place], [place] (336)723-1848, Ext. 123 Mondays & Thursdays: 7-9 AM.  First 15 patients are seen on a first come, first serve basis. °  ° °Medicaid-accepting [place] Providers: ° °Organization         Address  Phone   Notes  °[facility] [address], [place] (336) 641-2100 Also accepts self-pay patients.  °[facility] [address], [place] ° (336) 856-9996   °[facility] [address], [place]  (336) 288-8857   °[facility] [address] [place] [address], [place] (336) 299-7000   °Veita Bland [address], [place]  ° (336) 373-1557 Only accepts [place] Access Medicaid patients after they have their name applied to their card.  ° °Self-Pay (no insurance) in [place]: ° °Organization         Address  Phone   Notes  °Sickle Cell Patients, [facility] [address], [place] (336) 832-1970   °[facility] [address], [place] (336) 832-4400   °[facility] ° [address], [place] (336) 992-4800   °[facility]/Dr. Osei-Bonsu ° [address] [place] [address], [place] or [address], [place] (336) [phone] Phone number for both [place] and [place] locations is the same.  °[facility] [address], [place] (336) 299-0000   °[facility] [address] [place] [address], [place] or [address] (336) 852-7530 °(336) 878-2260   °[facility] [address], [place] (336) 350-1642, phone; (336) 294-5005, fax Sees patients 1st and 3rd Saturday of every month.  Must not qualify for public or private insurance (i.e. Medicaid, Medicare, [place] Health Choice, Veterans' Benefits) • Household income should be no more than 200% of the poverty level •The clinic cannot treat you if you are pregnant or think you are pregnant • Sexually transmitted diseases are not treated at the clinic.  ° ° °Dental Care: °Organization         Address  Phone  Notes  °[place] [facility] [address], [place] (336) 641-6152 Accepts children up to age 21 who are enrolled in Medicaid or [place] Health Choice; pregnant women with a Medicaid card; and children who have applied for Medicaid or [place] Health Choice, but were declined, whose parents can pay a reduced fee at time of service.  °[place]  [facility] [place]  [address], [place] (336) 641-7733 Accepts children up to age 21 who are enrolled in Medicaid or [place] Health Choice; pregnant women with a Medicaid card; and children who have applied for Medicaid or [place] Health Choice, but were declined, whose parents can pay a reduced fee at time of service.  °[facility] ° [address],  (336) 641-4533 Patients are seen by appointment only. Walk-ins are not accepted. [facility] will see patients 18 years of age and older. °Monday - Tuesday (8am-5pm) °Most Wednesdays (8:30-5pm) °$30 per visit, cash only  °[facility] ° [address], [place] (336) 641-4533 Patients are seen by appointment only. Walk-ins are not accepted. [facility] will see patients 18 years of age and older. °One   Wednesday Evening (Monthly: Volunteer Based).  $30 per visit, cash only  °[facility]  (919) [phone] for adults; Children under age 4, call Graduate Pediatric Dentistry at ([phone]) [phone]. Children aged 4-14, please call ([phone]) [phone] to request a pediatric application. ° Dental services are provided in all areas of dental care including fillings, crowns and bridges, complete and partial dentures, implants, gum treatment, root canals, and extractions. Preventive care is also provided. Treatment is provided to both adults and children. °Patients are selected via a lottery and there is often a waiting list. °  °[facility] [address], °[place] ° (336) 763-8833 [email or web address] °  °[facility] [address], [place], [place] (336)723-1848, Ext. 123 Second and Fourth Thursday of each month, opens at 6:30 AM; Clinic ends at 9 AM.  Patients are seen on a first-come first-served basis, and a limited number are seen during each clinic.  ° °[facility] ° [address], [place], [place] (336) [phone]    Eligibility Requirements °You must have lived in [place], or [place] counties for at least the last three months. °  You cannot be eligible for state or federal sponsored healthcare insurance, including Veterans Administration, Medicaid, or Medicare. °  You generally cannot be eligible for healthcare insurance through your employer.  °  How to apply: °Eligibility screenings are held every Tuesday and Wednesday afternoon from 1:00 pm until 4:00 pm. You do not need an appointment for the interview!  °[facility] [address], [place], [place] 336-631-2330   °[facility]  336-342-8273   °[facility]  336-703-3100   °[facility]  336-570-6415   ° °Behavioral Health Resources in the Community: °Intensive Outpatient Programs °Organization         Address  Phone  Notes  °[facility] [address], [place], [place] 336-878-6098   °[facility] [address], [place], [place] 336-832-9800   °[facility] [address], [place], [place] ° 336-882-2125   °[facility] [address],  °[place], [place] 1-800-853-5163 or 336-641-4981   °Substance Abuse Resources °Organization         Address  Phone  Notes  °[facility]  336-882-2125   °[facility]  336-784-9470   °[facility]  336-285-9073   °[facility]  336-845-3988   °Residential & Outpatient Substance Abuse Program  1-800-659-3381   °Psychological Services °Organization         Address  Phone  Notes  °[facility]  336- 832-9600   °[facility]  336- 378-7881   °[facility] [address], [place] 1-800-853-5163 or 336-641-4981   ° °Mobile Crisis Teams °Organization         Address  Phone  Notes  °[facility], Mobile Crisis Care Unit  1-877-626-1772   °Assertive °[facility] ° [address]  [place], [place] 336-834-9664   °Sharon DeEsch [address] °[place] 336-554-5454   ° °Self-Help/Support Groups °Organization         Address  Phone             Notes  °[facility]  - variety of support groups  336- 373-1402 Call for more information  °Narcotics Anonymous (NA), [facility] [address], °[place]   2 meetings at this location  ° °  Residential Treatment Programs Organization         Address  Phone  Notes  [facility] [address],    [place]  1-805-102-2200   [facility]  [address] [place] [address], [place], [place]   [place], [place] (763)356-5915 Admissions: 8am-3pm M-F  [facility] [place] [address],    [place], [place] 619-509-3267   [facility] [address] [place], [place], [place]   [facility] [address]  [place], [place]   [facility] Intensive Outpatient [address], [place], [place]   [facility] [address] [place]  [place], [place] 1-250-344-5522 or 780-355-0323   [facility] [address], [place], [place] Accepts Medicaid  [facility] [address]  [place] 1-216-119-4572 Substance Abuse/Addiction Treatment   [facility] Organization         Address  Phone  Notes  [facility]  817-310-1927   Domenic Schwab, PhD [address] [place], [place]   (867) 818-0442 or 6507660216   [facility] [place], [place] (479)[phone]   [facility] [address] [place], [place] (450) 484-2156 Insurance/Medicaid/sponsorship through [facility] [address]                                    [place], [place] [phone] [place] [address], [place] [phone]    Dr. Adele Schilder  [phone]   [facility] Dept. 1) [address], [place] 2) [place] 3)  [place] [address], [place] 747-229-9281 [phone]  ([phone]   [place] 805-123-4576 or [phone] (After Hours)      Take the prescriptions as directed.  Apply moist heat or ice to the area(s) of discomfort, for 15 minutes at a time, several times per day for the next few days.  Do not fall asleep on a heating or ice pack.  Call your regular medical doctor on Monday to schedule a follow up appointment this week.  Return to the Emergency Department immediately if worsening.

## 2014-08-21 NOTE — ED Notes (Signed)
C/o headache for one week and pain to left arm since 2 am.  CBG 105,  BP 170/100 with EMS.

## 2014-08-21 NOTE — ED Provider Notes (Signed)
CSN: [id number]     Arrival date & time 08/21/14  1138 History   First MD Initiated Contact with Patient 08/21/14 1141     Chief Complaint  Patient presents with  . Shoulder Pain  . Neck Pain      HPI Pt was seen at 1150. Per pt, c/o gradual onset and persistence of constant left sided neck "pain" for the past 1 week. Pt states she developed left shoulder pain overnight last night approximately 0200. Pt states her symptoms began after she "started sleeping on a new pillow." Denies CP/palpitations, no SOB/cough, no abd pain, no N/V/D, no rash, no fevers, no focal motor weakness, no tingling/numbness in extremities.    Past Medical History  Diagnosis Date  . Asthma   . Depression   . Emphysema   . Chronic headaches   . Emphysema of lung   . Hypertension   . Thyroid disease    Past Surgical History  Procedure Laterality Date  . Back surgery    . Appendectomy    . Cyst removal hand  2015  . Colonoscopy w/ polypectomy  07/2013    1 polyp removal   Family History  Problem Relation Age of Onset  . Hypertension Mother   . Hypertension Father   . Heart disease Maternal Grandfather   . Heart disease Paternal Grandmother    History  Substance Use Topics  . Smoking status: Current Some Day Smoker -- 0.50 packs/day    Types: Cigarettes  . Smokeless tobacco: Not on file  . Alcohol Use: Yes    Review of Systems ROS: Statement: All systems negative except as marked or noted in the HPI; Constitutional: Negative for fever and chills. ; ; Eyes: Negative for eye pain, redness and discharge. ; ; ENMT: Negative for ear pain, hoarseness, nasal congestion, sinus pressure and sore throat. ; ; Cardiovascular: Negative for chest pain, palpitations, diaphoresis, dyspnea and peripheral edema. ; ; Respiratory: Negative for cough, wheezing and stridor. ; ; Gastrointestinal: Negative for nausea, vomiting, diarrhea, abdominal pain, blood in stool, hematemesis, jaundice and rectal bleeding. . ; ;  Genitourinary: Negative for dysuria, flank pain and hematuria. ; ; Musculoskeletal: +left sided neck and shoulder pain. Negative for back pain. Negative for swelling and trauma.; ; Skin: Negative for pruritus, rash, abrasions, blisters, bruising and skin lesion.; ; Neuro: Negative for headache, lightheadedness and neck stiffness. Negative for weakness, altered level of consciousness , altered mental status, extremity weakness, paresthesias, involuntary movement, seizure and syncope.      Allergies  Influenza vaccines; Pneumococcal vaccines; Amoxicillin; Codeine; Hydromorphone hcl; Sulfonamide derivatives; and Hydrocodone-acetaminophen  Home Medications   Prior to Admission medications   Medication Sig Start Date End Date Taking? Authorizing Provider  ALPRAZolam Duanne Moron) 1 MG tablet Take 1 mg by mouth 4 (four) times daily.    Yes Historical Provider, MD  esomeprazole (NEXIUM) 40 MG capsule Take 40 mg by mouth 2 (two) times daily as needed (takes 1 tablet daily, then 1 addt'l tablet if needed).    Yes Historical Provider, MD  fluticasone Asencion Islam) 50 MCG/ACT nasal spray  02/09/14  Yes Historical Provider, MD  HYDROcodone-acetaminophen (NORCO) 7.5-325 MG per tablet  07/30/13  Yes Historical Provider, MD  ibuprofen (ADVIL,MOTRIN) 800 MG tablet Take 800 mg by mouth every 8 (eight) hours as needed. 08/09/14  Yes Historical Provider, MD  furosemide (LASIX) 20 MG tablet Take 20 mg by mouth daily.    Historical Provider, MD  levothyroxine (SYNTHROID, LEVOTHROID) 175 MCG tablet Take 175  mcg by mouth daily. 08/12/14   Historical Provider, MD  meloxicam (MOBIC) 15 MG tablet Take 15 mg by mouth daily.    Historical Provider, MD  montelukast (SINGULAIR) 10 MG tablet Take 10 mg by mouth daily. 02/09/14   Historical Provider, MD  PROAIR HFA 108 (90 BASE) MCG/ACT inhaler  06/19/13   Historical Provider, MD  QUEtiapine (SEROQUEL) 300 MG tablet Take 300 mg by mouth at bedtime.    Historical Provider, MD  rOPINIRole  (REQUIP) 3 MG tablet Take 4 mg by mouth at bedtime. Takes 3mg  at bedtime, then takes addt'l 3mg  as needed    Historical Provider, MD  sertraline (ZOLOFT) 50 MG tablet Take 100 mg by mouth daily.     Historical Provider, MD  simvastatin (ZOCOR) 20 MG tablet Take 20 mg by mouth daily at 6 PM.    Historical Provider, MD   BP 142/96 mmHg  Pulse 85  Temp(Src) 98 F (36.7 C) (Oral)  Resp 14  Ht 5\' 5"  (1.651 m)  Wt 216 lb (97.977 kg)  BMI 35.94 kg/m2  SpO2 96% Physical Exam  1155; Physical examination:  Nursing notes reviewed; Vital signs and O2 SAT reviewed;  Constitutional: Well developed, Well nourished, Well hydrated, In no acute distress; Head:  Normocephalic, atraumatic; Eyes: EOMI, PERRL, No scleral icterus; ENMT: Mouth and pharynx normal, Mucous membranes moist; Neck: Supple, Full range of motion, No lymphadenopathy; Cardiovascular: Regular rate and rhythm, No murmur, rub, or gallop; Respiratory: Breath sounds clear & equal bilaterally, No rales, rhonchi, wheezes.  Speaking full sentences with ease, Normal respiratory effort/excursion; Chest: Nontender, Movement normal; Abdomen: Soft, Nontender, Nondistended, Normal bowel sounds; Genitourinary: No CVA tenderness; Spine:  No midline CS, TS, LS tenderness. +TTP left hypertonic trapezius muscle. No rash.;; Extremities: Pulses normal. Left shoulder w/FROM.  +generalzied TTP without specific area of point tenderness. NT to palp left clavicle, scapula NT, proximal humerus NT, biceps tendon NT over bicipital groove.  Motor strength at shoulder normal.  Sensation intact over deltoid region, distal NMS intact with left hand having intact and equal sensation and strength in the distribution of the median, radial, and ulnar nerve function compared to opposite side.  Strong radial pulse.  +FROM left elbow with intact motor strength biceps and triceps muscles to resistance. NT left elbow/wrist/hand. No rash, no ecchymosis, no erythema. No deformity, no edema. No  calf edema or asymmetry.; Neuro: AA&Ox3, Major CN grossly intact.  Speech clear. No gross focal motor or sensory deficits in extremities.; Skin: Color normal, Warm, Dry.; Psych:  Affect flat, poor eye contact.    ED Course  Procedures     EKG Interpretation   Date/Time:  Saturday Aug 21 2014 11:38:28 EDT Ventricular Rate:  84 PR Interval:  161 QRS Duration: 85 QT Interval:  379 QTC Calculation: 448 R Axis:   10 Text Interpretation:  Sinus rhythm When compared with ECG of 12/17/2007 No  significant change was found Confirmed by [facility]  MD, Nunzio Cory [phone])  on 08/21/2014 1:59:58 PM      MDM  MDM Reviewed: previous chart, nursing note and vitals Reviewed previous: labs and ECG Interpretation: labs, ECG, x-ray and CT scan   Results for orders placed or performed during the hospital encounter of 08/21/14  I-stat Chem 8, ED  Result Value Ref Range   Sodium 140 135 - 145 mmol/L   Potassium 4.1 3.5 - 5.1 mmol/L   Chloride 105 101 - 111 mmol/L   BUN 16 6 - 20 mg/dL   Creatinine,  Ser 0.60 0.44 - 1.00 mg/dL   Glucose, Bld 108 (H) 65 - 99 mg/dL   Calcium, Ion 1.19 1.12 - 1.23 mmol/L   TCO2 20 0 - 100 mmol/L   Hemoglobin 14.3 12.0 - 15.0 g/dL   HCT 42.0 36.0 - 46.0 %  I-stat troponin, ED  Result Value Ref Range   Troponin i, poc 0.00 0.00 - 0.08 ng/mL   Comment 3           Ct Cervical Spine Wo Contrast 08/21/2014   CLINICAL DATA:  Acute left arm pain.  EXAM: CT CERVICAL SPINE WITHOUT CONTRAST  TECHNIQUE: Multidetector CT imaging of the cervical spine was performed without intravenous contrast. Multiplanar CT image reconstructions were also generated.  COMPARISON:  CT scan of June 30, 2014.  FINDINGS: Straightening of normal lordosis of cervical spine is again noted. No fracture or spondylolisthesis is noted. Minimal degenerative disc disease is noted at C4-5 with anterior osteophyte formation. Mild degenerative disc disease is noted at C5-6 with minimal anterior osteophyte  formation. Posterior facet joints are unremarkable. Mild dextroscoliosis of cervical spine is noted. Visualized lung apices appear normal. 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  IMPRESSION: Mild dextroscoliosis of cervical spine is noted. No fracture or spondylolisthesis is noted. Minimal mild degenerative disc disease is noted at C4-5 and C5-6.   Electronically Signed   By: Marijo Conception, M.D.   On: 08/21/2014 12:49   Dg Shoulder Left 08/21/2014   CLINICAL DATA:  Left arm pain since 2 a.m. this morning.  No injury.  EXAM: LEFT SHOULDER - 2+ VIEW  COMPARISON:  None.  FINDINGS: There is no evidence of fracture or dislocation. There is no evidence of arthropathy or other focal bone abnormality. Soft tissues are unremarkable.  IMPRESSION: No acute fracture or dislocation.   Electronically Signed   By: Abelardo Diesel M.D.   On: 08/21/2014 12:32     1400:  Pt states she feels better now and wants to go home.  Doubt PE as cause for symptoms with low risk [place].  Doubt ACS as cause for atypical symptoms with normal troponin and unchanged EKG from previous after 11+ hours of constant symptoms. Tx symptomatically for msk pain.  [place] Controlled Substance Database accessed: pt filled hydrocodone 7.5/APAP 325mg  tabs, #150, on 08/13/14. Will not rx any further narcotics. Dx and testing d/w pt.  Questions answered.  Verb understanding, agreeable to d/c home with outpt f/u.       Francine Graven, DO 08/25/14 1119

## 2014-09-20 DIAGNOSIS — B029 Zoster without complications: Secondary | ICD-10-CM | POA: Diagnosis not present

## 2014-09-20 DIAGNOSIS — I10 Essential (primary) hypertension: Secondary | ICD-10-CM | POA: Diagnosis not present

## 2014-09-20 DIAGNOSIS — Z1389 Encounter for screening for other disorder: Secondary | ICD-10-CM | POA: Diagnosis not present

## 2014-09-20 DIAGNOSIS — E782 Mixed hyperlipidemia: Secondary | ICD-10-CM | POA: Diagnosis not present

## 2014-10-13 DIAGNOSIS — R197 Diarrhea, unspecified: Secondary | ICD-10-CM | POA: Diagnosis not present

## 2014-10-13 DIAGNOSIS — K047 Periapical abscess without sinus: Secondary | ICD-10-CM | POA: Diagnosis not present

## 2014-10-14 DIAGNOSIS — M1990 Unspecified osteoarthritis, unspecified site: Secondary | ICD-10-CM | POA: Diagnosis not present

## 2014-10-14 DIAGNOSIS — I10 Essential (primary) hypertension: Secondary | ICD-10-CM | POA: Diagnosis not present

## 2014-10-14 DIAGNOSIS — K529 Noninfective gastroenteritis and colitis, unspecified: Secondary | ICD-10-CM | POA: Diagnosis not present

## 2014-10-14 DIAGNOSIS — Z79899 Other long term (current) drug therapy: Secondary | ICD-10-CM | POA: Diagnosis not present

## 2014-10-14 DIAGNOSIS — D869 Sarcoidosis, unspecified: Secondary | ICD-10-CM | POA: Diagnosis not present

## 2014-10-14 DIAGNOSIS — Z880 Allergy status to penicillin: Secondary | ICD-10-CM | POA: Diagnosis not present

## 2014-10-14 DIAGNOSIS — R1013 Epigastric pain: Secondary | ICD-10-CM | POA: Diagnosis not present

## 2014-10-14 DIAGNOSIS — E039 Hypothyroidism, unspecified: Secondary | ICD-10-CM | POA: Diagnosis not present

## 2014-10-14 DIAGNOSIS — F418 Other specified anxiety disorders: Secondary | ICD-10-CM | POA: Diagnosis not present

## 2014-10-14 DIAGNOSIS — Z8249 Family history of ischemic heart disease and other diseases of the circulatory system: Secondary | ICD-10-CM | POA: Diagnosis not present

## 2014-10-14 DIAGNOSIS — M2660 Temporomandibular joint disorder, unspecified: Secondary | ICD-10-CM | POA: Diagnosis not present

## 2014-10-14 DIAGNOSIS — K5289 Other specified noninfective gastroenteritis and colitis: Secondary | ICD-10-CM | POA: Diagnosis not present

## 2014-10-14 DIAGNOSIS — Z885 Allergy status to narcotic agent status: Secondary | ICD-10-CM | POA: Diagnosis not present

## 2014-10-14 DIAGNOSIS — K219 Gastro-esophageal reflux disease without esophagitis: Secondary | ICD-10-CM | POA: Diagnosis not present

## 2014-10-14 DIAGNOSIS — Z9049 Acquired absence of other specified parts of digestive tract: Secondary | ICD-10-CM | POA: Diagnosis not present

## 2014-10-14 DIAGNOSIS — Z888 Allergy status to other drugs, medicaments and biological substances status: Secondary | ICD-10-CM | POA: Diagnosis not present

## 2014-10-14 DIAGNOSIS — Z882 Allergy status to sulfonamides status: Secondary | ICD-10-CM | POA: Diagnosis not present

## 2014-10-14 DIAGNOSIS — Z720 Tobacco use: Secondary | ICD-10-CM | POA: Diagnosis not present

## 2014-10-14 DIAGNOSIS — K769 Liver disease, unspecified: Secondary | ICD-10-CM | POA: Diagnosis not present

## 2014-11-03 DIAGNOSIS — F411 Generalized anxiety disorder: Secondary | ICD-10-CM | POA: Diagnosis not present

## 2014-11-03 DIAGNOSIS — I10 Essential (primary) hypertension: Secondary | ICD-10-CM | POA: Diagnosis not present

## 2014-11-03 DIAGNOSIS — M545 Low back pain: Secondary | ICD-10-CM | POA: Diagnosis not present

## 2014-11-03 DIAGNOSIS — K219 Gastro-esophageal reflux disease without esophagitis: Secondary | ICD-10-CM | POA: Diagnosis not present

## 2014-11-03 DIAGNOSIS — E039 Hypothyroidism, unspecified: Secondary | ICD-10-CM | POA: Diagnosis not present

## 2014-11-15 ENCOUNTER — Ambulatory Visit: Payer: Medicaid Other | Admitting: Internal Medicine

## 2014-11-17 DIAGNOSIS — S20212A Contusion of left front wall of thorax, initial encounter: Secondary | ICD-10-CM | POA: Diagnosis not present

## 2014-11-17 DIAGNOSIS — S20211A Contusion of right front wall of thorax, initial encounter: Secondary | ICD-10-CM | POA: Diagnosis not present

## 2014-11-25 ENCOUNTER — Emergency Department (HOSPITAL_COMMUNITY): Payer: Medicare Other

## 2014-11-25 ENCOUNTER — Emergency Department (HOSPITAL_COMMUNITY)
Admission: EM | Admit: 2014-11-25 | Discharge: 2014-11-25 | Disposition: A | Discharge status: Home / Self Care | Payer: Medicare Other | Attending: Emergency Medicine | Admitting: Emergency Medicine

## 2014-11-25 ENCOUNTER — Encounter (HOSPITAL_COMMUNITY): Payer: Self-pay

## 2014-11-25 DIAGNOSIS — G8929 Other chronic pain: Secondary | ICD-10-CM | POA: Diagnosis not present

## 2014-11-25 DIAGNOSIS — Z7951 Long term (current) use of inhaled steroids: Secondary | ICD-10-CM | POA: Diagnosis not present

## 2014-11-25 DIAGNOSIS — Y92239 Unspecified place in hospital as the place of occurrence of the external cause: Secondary | ICD-10-CM | POA: Insufficient documentation

## 2014-11-25 DIAGNOSIS — Z880 Allergy status to penicillin: Secondary | ICD-10-CM | POA: Diagnosis not present

## 2014-11-25 DIAGNOSIS — Y998 Other external cause status: Secondary | ICD-10-CM | POA: Diagnosis not present

## 2014-11-25 DIAGNOSIS — J45909 Unspecified asthma, uncomplicated: Secondary | ICD-10-CM | POA: Diagnosis not present

## 2014-11-25 DIAGNOSIS — S20212A Contusion of left front wall of thorax, initial encounter: Secondary | ICD-10-CM | POA: Diagnosis not present

## 2014-11-25 DIAGNOSIS — R0789 Other chest pain: Secondary | ICD-10-CM | POA: Diagnosis not present

## 2014-11-25 DIAGNOSIS — Z720 Tobacco use: Secondary | ICD-10-CM | POA: Diagnosis not present

## 2014-11-25 DIAGNOSIS — Z79899 Other long term (current) drug therapy: Secondary | ICD-10-CM | POA: Diagnosis not present

## 2014-11-25 DIAGNOSIS — F329 Major depressive disorder, single episode, unspecified: Secondary | ICD-10-CM | POA: Insufficient documentation

## 2014-11-25 DIAGNOSIS — Y9389 Activity, other specified: Secondary | ICD-10-CM | POA: Insufficient documentation

## 2014-11-25 DIAGNOSIS — E079 Disorder of thyroid, unspecified: Secondary | ICD-10-CM | POA: Insufficient documentation

## 2014-11-25 DIAGNOSIS — W010XXA Fall on same level from slipping, tripping and stumbling without subsequent striking against object, initial encounter: Secondary | ICD-10-CM | POA: Insufficient documentation

## 2014-11-25 DIAGNOSIS — S299XXA Unspecified injury of thorax, initial encounter: Secondary | ICD-10-CM | POA: Diagnosis present

## 2014-11-25 DIAGNOSIS — I10 Essential (primary) hypertension: Secondary | ICD-10-CM | POA: Diagnosis not present

## 2014-11-25 DIAGNOSIS — J439 Emphysema, unspecified: Secondary | ICD-10-CM | POA: Insufficient documentation

## 2014-11-25 MED ORDER — NAPROXEN 500 MG PO TABS: 500.0000 mg | ORAL_TABLET | Freq: Two times a day (BID) | ORAL | Status: DC

## 2014-11-25 MED ORDER — NAPROXEN 500 MG PO TABS
500.0000 mg | ORAL_TABLET | Freq: Once | ORAL | Status: AC
  Administered 2014-11-25: 500 mg via ORAL
  Filled 2014-11-25: qty 1

## 2014-11-25 NOTE — ED Provider Notes (Signed)
CSN: [id number]     Arrival date & time 11/25/14  1106 History  This chart was scribed for non-physician practitioner, Ottie Glazier, PA-C working with Lacretia Leigh, MD by Rayna Sexton, ED scribe. This patient was seen in room WTR5/WTR5 and the patient's care was started at 12:17 PM.   Chief Complaint  Patient presents with  . Fall  . Ribcage pain    The history is provided by the patient. No language interpreter was used.    HPI Comments: Katherine Hayden is a 49 y.o. female who presents to the Emergency Department complaining of a fall that occurred PTA. Pt notes that she was entering the hospital and while helping her elderly mother ambulate with a walker, tripped over a bump and landed on her left side. She notes trouble getting back up and associated, moderate, left ribcage pain that worsens with deep breathing or movement. Pt denies taking any medications for pain management since onset. Pt denies being on any blood thinning medication and notes allergies to dilaudid and morphine. She denies any head injury or loss of consciousness.  Past Medical History  Diagnosis Date  . Asthma   . Depression   . Emphysema   . Chronic headaches   . Emphysema of lung   . Hypertension   . Thyroid disease    Past Surgical History  Procedure Laterality Date  . Back surgery    . Appendectomy    . Cyst removal hand  2015  . Colonoscopy w/ polypectomy  07/2013    1 polyp removal   Family History  Problem Relation Age of Onset  . Hypertension Mother   . Hypertension Father   . Heart disease Maternal Grandfather   . Heart disease Paternal Grandmother    Social History  Substance Use Topics  . Smoking status: Current Every Day Smoker -- 0.50 packs/day    Types: Cigarettes  . Smokeless tobacco: None  . Alcohol Use: Yes   OB History    No data available     Review of Systems  Musculoskeletal: Positive for myalgias, back pain and arthralgias.  Skin: Negative for wound.  All  other systems reviewed and are negative.  Allergies  Influenza vaccines; Pneumococcal vaccines; Amoxicillin; Codeine; Hydromorphone hcl; Sulfonamide derivatives; and Hydrocodone-acetaminophen  Home Medications   Prior to Admission medications   Medication Sig Start Date End Date Taking? Authorizing Provider  ALPRAZolam Duanne Moron) 1 MG tablet Take 1 mg by mouth 4 (four) times daily.     Historical Provider, MD  esomeprazole (NEXIUM) 40 MG capsule Take 40 mg by mouth 2 (two) times daily as needed (takes 1 tablet daily, then 1 addt'l tablet if needed).     Historical Provider, MD  fluticasone Asencion Islam) 50 MCG/ACT nasal spray  02/09/14   Historical Provider, MD  HYDROcodone-acetaminophen (NORCO) 7.5-325 MG per tablet  07/30/13   Historical Provider, MD  ibuprofen (ADVIL,MOTRIN) 800 MG tablet Take 800 mg by mouth every 8 (eight) hours as needed. 08/09/14   Historical Provider, MD  levothyroxine (SYNTHROID, LEVOTHROID) 175 MCG tablet Take 175 mcg by mouth daily. 08/12/14   Historical Provider, MD  methocarbamol (ROBAXIN) 500 MG tablet Take 2 tablets (1,000 mg total) by mouth 4 (four) times daily as needed for muscle spasms (muscle spasm/pain). 08/21/14   Francine Graven, DO  montelukast (SINGULAIR) 10 MG tablet Take 10 mg by mouth daily. 02/09/14   Historical Provider, MD  naproxen (NAPROSYN) 500 MG tablet Take 1 tablet (500 mg total) by mouth 2 (  two) times daily. 11/25/14   Aaniya Sterba Patel-Mills, PA-C  PROAIR HFA 108 (90 BASE) MCG/ACT inhaler  06/19/13   Historical Provider, MD  QUEtiapine (SEROQUEL) 300 MG tablet Take 300 mg by mouth at bedtime.    Historical Provider, MD  rOPINIRole (REQUIP) 3 MG tablet Take 4 mg by mouth at bedtime. Takes 3mg  at bedtime, then takes addt'l 3mg  as needed    Historical Provider, MD  simvastatin (ZOCOR) 20 MG tablet Take 20 mg by mouth daily at 6 PM.    Historical Provider, MD   BP 153/78 mmHg  Pulse 90  Temp(Src) 97.9 F (36.6 C) (Oral)  Resp 18  SpO2 97% Physical Exam   Constitutional: She is oriented to person, place, and time. She appears well-developed.  HENT:  Head: Normocephalic and atraumatic.  Mouth/Throat: No oropharyngeal exudate.  Eyes: Right eye exhibits no discharge. Left eye exhibits no discharge.  Neck: Normal range of motion. No tracheal deviation present.  Cardiovascular: Normal rate.   Pulmonary/Chest: Effort normal and breath sounds normal. No respiratory distress. She has no wheezes. She has no rales. She exhibits tenderness.  95% O2 level on RA  Abdominal: Soft. There is no tenderness.  Musculoskeletal: Normal range of motion. She exhibits tenderness.  Left sided rib tenderness; no flail chest; no use of accessory muscles; no contusion or ecchymosis noted along her ribs  Neurological: She is alert and oriented to person, place, and time.  Skin: Skin is warm and dry. She is not diaphoretic.  Psychiatric: She has a normal mood and affect. Her behavior is normal.  Nursing note and vitals reviewed.  ED Course  Procedures  DIAGNOSTIC STUDIES: Oxygen Saturation is 95% on RA, adequate by my interpretation.    COORDINATION OF CARE: 12:20 PM Discussed treatment plan with pt at bedside and pt agreed to plan.  Labs Review Labs Reviewed - No data to display  Imaging Review Dg Ribs Unilateral W/chest Left  11/25/2014   CLINICAL DATA:  Tripped and fell today.  Left-sided chest pain.  EXAM: LEFT RIBS AND CHEST - 3+ VIEW  COMPARISON:  11/17/2014  FINDINGS: The cardiac silhouette, mediastinal and hilar contours are stable. The lungs are clear. No pleural effusion or pneumothorax.  Dedicated the is of the left ribs do not demonstrate any definite acute rib fractures.  IMPRESSION: No acute cardiopulmonary findings and no definite acute left-sided rib fractures.   Electronically Signed   By: Marijo Sanes M.D.   On: 11/25/2014 12:04   I have personally reviewed and evaluated these images and lab results as part of my medical decision-making.    EKG Interpretation None      MDM   Final diagnoses:  Rib contusion, left, initial encounter   Patient presents after trip and fall for rib pain.  Xray is negative for pneumothorax or rib fractures.  Medications  naproxen (NAPROSYN) tablet 500 mg (500 mg Oral Given 11/25/14 1239)  She was given incentive spirometer. I discussed return precautions and follow up.  She agrees with the plan. Rx: Naproxen.  I personally performed the services described in this documentation, which was scribed in my presence. The recorded information has been reviewed and is accurate.    Ottie Glazier, PA-C 11/25/14 1906  Lacretia Leigh, MD 11/26/14 [phone]

## 2014-11-25 NOTE — Discharge Instructions (Signed)
Chest Contusion Use incentive spirometer several times a day. Use naproxen for pain.  A chest contusion is a deep bruise on your chest area. Contusions are the result of an injury that caused bleeding under the skin. A chest contusion may involve bruising of the skin, muscles, or ribs. The contusion may turn blue, purple, or yellow. Minor injuries will give you a painless contusion, but more severe contusions may stay painful and swollen for a few weeks. CAUSES  A contusion is usually caused by a blow, trauma, or direct force to an area of the body. SYMPTOMS   Swelling and redness of the injured area.  Discoloration of the injured area.  Tenderness and soreness of the injured area.  Pain. DIAGNOSIS  The diagnosis can be made by taking a history and performing a physical exam. An X-ray, CT scan, or MRI may be needed to determine if there were any associated injuries, such as broken bones (fractures) or internal injuries. TREATMENT  Often, the best treatment for a chest contusion is resting, icing, and applying cold compresses to the injured area. Deep breathing exercises may be recommended to reduce the risk of pneumonia. Over-the-counter medicines may also be recommended for pain control. HOME CARE INSTRUCTIONS   Put ice on the injured area.  Put ice in a plastic bag.  Place a towel between your skin and the bag.  Leave the ice on for 15-20 minutes, 03-04 times a day.  Only take over-the-counter or prescription medicines as directed by your caregiver. Your caregiver may recommend avoiding anti-inflammatory medicines (aspirin, ibuprofen, and naproxen) for 48 hours because these medicines may increase bruising.  Rest the injured area.  Perform deep-breathing exercises as directed by your caregiver.  Stop smoking if you smoke.  Do not lift objects over 5 pounds (2.3 kg) for 3 days or longer if recommended by your caregiver. SEEK IMMEDIATE MEDICAL CARE IF:   You have increased  bruising or swelling.  You have pain that is getting worse.  You have difficulty breathing.  You have dizziness, weakness, or fainting.  You have blood in your urine or stool.  You cough up or vomit blood.  Your swelling or pain is not relieved with medicines. MAKE SURE YOU:   Understand these instructions.  Will watch your condition.  Will get help right away if you are not doing well or get worse. Document Released: 12/19/2000 Document Revised: 12/19/2011 Document Reviewed: 09/17/2011 [facility] Patient Information 2015 [place], [place]. This information is not intended to replace advice given to you by your health care provider. Make sure you discuss any questions you have with your health care provider.

## 2014-11-25 NOTE — ED Notes (Signed)
Pt c/o L ribcage pain after falling of a walker.  Pain score 9/10.  Denies hitting head and LOC.  Denies blood thinners.

## 2014-11-27 ENCOUNTER — Emergency Department (HOSPITAL_COMMUNITY)
Admission: EM | Admit: 2014-11-27 | Discharge: 2014-11-27 | Disposition: A | Discharge status: Home / Self Care | Payer: Medicare Other | Attending: Emergency Medicine | Admitting: Emergency Medicine

## 2014-11-27 ENCOUNTER — Encounter (HOSPITAL_COMMUNITY): Payer: Self-pay

## 2014-11-27 ENCOUNTER — Emergency Department (HOSPITAL_COMMUNITY): Payer: Medicare Other

## 2014-11-27 DIAGNOSIS — W010XXA Fall on same level from slipping, tripping and stumbling without subsequent striking against object, initial encounter: Secondary | ICD-10-CM | POA: Insufficient documentation

## 2014-11-27 DIAGNOSIS — F329 Major depressive disorder, single episode, unspecified: Secondary | ICD-10-CM | POA: Diagnosis not present

## 2014-11-27 DIAGNOSIS — Y9389 Activity, other specified: Secondary | ICD-10-CM | POA: Diagnosis not present

## 2014-11-27 DIAGNOSIS — J45909 Unspecified asthma, uncomplicated: Secondary | ICD-10-CM | POA: Insufficient documentation

## 2014-11-27 DIAGNOSIS — S20212A Contusion of left front wall of thorax, initial encounter: Secondary | ICD-10-CM | POA: Diagnosis not present

## 2014-11-27 DIAGNOSIS — Z720 Tobacco use: Secondary | ICD-10-CM | POA: Diagnosis not present

## 2014-11-27 DIAGNOSIS — G8929 Other chronic pain: Secondary | ICD-10-CM | POA: Insufficient documentation

## 2014-11-27 DIAGNOSIS — Y9289 Other specified places as the place of occurrence of the external cause: Secondary | ICD-10-CM | POA: Insufficient documentation

## 2014-11-27 DIAGNOSIS — Z79899 Other long term (current) drug therapy: Secondary | ICD-10-CM | POA: Insufficient documentation

## 2014-11-27 DIAGNOSIS — Z7951 Long term (current) use of inhaled steroids: Secondary | ICD-10-CM | POA: Insufficient documentation

## 2014-11-27 DIAGNOSIS — I10 Essential (primary) hypertension: Secondary | ICD-10-CM | POA: Diagnosis not present

## 2014-11-27 DIAGNOSIS — Z880 Allergy status to penicillin: Secondary | ICD-10-CM | POA: Insufficient documentation

## 2014-11-27 DIAGNOSIS — E079 Disorder of thyroid, unspecified: Secondary | ICD-10-CM | POA: Diagnosis not present

## 2014-11-27 DIAGNOSIS — S0990XA Unspecified injury of head, initial encounter: Secondary | ICD-10-CM | POA: Diagnosis present

## 2014-11-27 DIAGNOSIS — R0789 Other chest pain: Secondary | ICD-10-CM | POA: Diagnosis not present

## 2014-11-27 DIAGNOSIS — Y998 Other external cause status: Secondary | ICD-10-CM | POA: Insufficient documentation

## 2014-11-27 MED ORDER — HYDROCODONE-ACETAMINOPHEN 5-325 MG PO TABS: 1.0000 | ORAL_TABLET | Freq: Four times a day (QID) | ORAL | Status: DC | PRN

## 2014-11-27 MED ORDER — HYDROCODONE-ACETAMINOPHEN 5-325 MG PO TABS
2.0000 | ORAL_TABLET | Freq: Once | ORAL | Status: AC
Start: 2014-11-27 — End: 2014-11-27
  Administered 2014-11-27: 2 via ORAL
  Filled 2014-11-27: qty 2

## 2014-11-27 NOTE — ED Notes (Signed)
She states she tripped and fell while assisting her mother earlier this week; landing on her left side and injuring her left ribs.  She had x-rays here--here today with c/o worsening left lat. Ribs area pain.  She is in no distress.

## 2014-11-27 NOTE — Discharge Instructions (Signed)
Chest Contusion A chest contusion is a deep bruise on your chest area. Contusions are the result of an injury that caused bleeding under the skin. A chest contusion may involve bruising of the skin, muscles, or ribs. The contusion may turn blue, purple, or yellow. Minor injuries will give you a painless contusion, but more severe contusions may stay painful and swollen for a few weeks. CAUSES  A contusion is usually caused by a blow, trauma, or direct force to an area of the body. SYMPTOMS   Swelling and redness of the injured area.  Discoloration of the injured area.  Tenderness and soreness of the injured area.  Pain. DIAGNOSIS  The diagnosis can be made by taking a history and performing a physical exam. An X-ray, CT scan, or MRI may be needed to determine if there were any associated injuries, such as broken bones (fractures) or internal injuries. TREATMENT  Often, the best treatment for a chest contusion is resting, icing, and applying cold compresses to the injured area. Deep breathing exercises may be recommended to reduce the risk of pneumonia. Over-the-counter medicines may also be recommended for pain control. HOME CARE INSTRUCTIONS   Put ice on the injured area.  Put ice in a plastic bag.  Place a towel between your skin and the bag.  Leave the ice on for 15-20 minutes, 03-04 times a day.  Only take over-the-counter or prescription medicines as directed by your caregiver. Your caregiver may recommend avoiding anti-inflammatory medicines (aspirin, ibuprofen, and naproxen) for 48 hours because these medicines may increase bruising.  Rest the injured area.  Perform deep-breathing exercises as directed by your caregiver.  Stop smoking if you smoke.  Do not lift objects over 5 pounds (2.3 kg) for 3 days or longer if recommended by your caregiver. SEEK IMMEDIATE MEDICAL CARE IF:   You have increased bruising or swelling.  You have pain that is getting worse.  You have  difficulty breathing.  You have dizziness, weakness, or fainting.  You have blood in your urine or stool.  You cough up or vomit blood.  Your swelling or pain is not relieved with medicines. MAKE SURE YOU:   Understand these instructions.  Will watch your condition.  Will get help right away if you are not doing well or get worse. Document Released: 12/19/2000 Document Revised: 12/19/2011 Document Reviewed: 09/17/2011 [facility] Patient Information 2015 [place], [place]. This information is not intended to replace advice given to you by your health care provider. Make sure you discuss any questions you have with your health care provider.      Blunt Chest Trauma Blunt chest trauma is an injury caused by a blow to the chest. These chest injuries can be very painful. Blunt chest trauma often results in bruised or broken (fractured) ribs. Most cases of bruised and fractured ribs from blunt chest traumas get better after 1 to 3 weeks of rest and pain medicine. Often, the soft tissue in the chest wall is also injured, causing pain and bruising. Internal organs, such as the heart and lungs, may also be injured. Blunt chest trauma can lead to serious medical problems. This injury requires immediate medical care. CAUSES   Motor vehicle collisions.  Falls.  Physical violence.  Sports injuries. SYMPTOMS   Chest pain. The pain may be worse when you move or breathe deeply.  Shortness of breath.  Lightheadedness.  Bruising.  Tenderness.  Swelling. DIAGNOSIS  Your caregiver will do a physical exam. X-rays may be taken to  look for fractures. However, minor rib fractures may not show up on X-rays until a few days after the injury. If a more serious injury is suspected, further imaging tests may be done. This may include ultrasounds, computed tomography (CT) scans, or magnetic resonance imaging (MRI). TREATMENT  Treatment depends on the severity of your injury. Your caregiver may  prescribe pain medicines and deep breathing exercises. HOME CARE INSTRUCTIONS  Limit your activities until you can move around without much pain.  Do not do any strenuous work until your injury is healed.  Put ice on the injured area.  Put ice in a plastic bag.  Place a towel between your skin and the bag.  Leave the ice on for 15-20 minutes, 03-04 times a day.  You may wear a rib belt as directed by your caregiver to reduce pain.  Practice deep breathing as directed by your caregiver to keep your lungs clear.  Only take over-the-counter or prescription medicines for pain, fever, or discomfort as directed by your caregiver. SEEK IMMEDIATE MEDICAL CARE IF:   You have increasing pain or shortness of breath.  You cough up blood.  You have nausea, vomiting, or abdominal pain.  You have a fever.  You feel dizzy, weak, or you faint. MAKE SURE YOU:  Understand these instructions.  Will watch your condition.  Will get help right away if you are not doing well or get worse. Document Released: 05/03/2004 Document Revised: 06/18/2011 Document Reviewed: 01/10/2011 [facility] Patient Information 2015 [place], [place]. This information is not intended to replace advice given to you by your health care provider. Make sure you discuss any questions you have with your health care provider.

## 2014-11-27 NOTE — ED Provider Notes (Signed)
CSN: [id number]     Arrival date & time 11/27/14  9417 History   First MD Initiated Contact with Patient 11/27/14 [phone]     Chief Complaint  Patient presents with  . Rib Injury     (Consider location/radiation/quality/duration/timing/severity/associated sxs/prior Treatment) HPI  49 year old female presents with acutely worsening mid chest and left chest pain. 2 days ago she and her mother tripped while in the emergency room visiting another patient. They tripped and landed on the floor together. She was evaluated that time and had a negative x-ray of her chest. Has been taking Naprosyn since with no significant relief. All of a sudden last night her pain seemed to acutely worsen and now is having uncontrollable pain. She has not gone home as her mother was admitted yesterday. Patient states she ate breakfast this morning but is having so much pain that she came to the ER to be evaluated. Hurts to breathe. She states she has been using the incentive spirometer as prescribed. Patient denies any cough or fever. Rates her pain as severe. No repeat injury.  Past Medical History  Diagnosis Date  . Asthma   . Depression   . Emphysema   . Chronic headaches   . Emphysema of lung   . Hypertension   . Thyroid disease    Past Surgical History  Procedure Laterality Date  . Back surgery    . Appendectomy    . Cyst removal hand  2015  . Colonoscopy w/ polypectomy  07/2013    1 polyp removal   Family History  Problem Relation Age of Onset  . Hypertension Mother   . Hypertension Father   . Heart disease Maternal Grandfather   . Heart disease Paternal Grandmother    Social History  Substance Use Topics  . Smoking status: Current Every Day Smoker -- 0.50 packs/day    Types: Cigarettes  . Smokeless tobacco: None  . Alcohol Use: Yes   OB History    No data available     Review of Systems  Respiratory: Negative for cough and shortness of breath.   Cardiovascular: Positive for chest pain.    Gastrointestinal: Negative for vomiting and abdominal pain.  All other systems reviewed and are negative.     Allergies  Influenza vaccines; Pneumococcal vaccines; Amoxicillin; Codeine; Hydromorphone hcl; Sulfonamide derivatives; and Hydrocodone-acetaminophen  Home Medications   Prior to Admission medications   Medication Sig Start Date End Date Taking? Authorizing Provider  ALPRAZolam Duanne Moron) 1 MG tablet Take 1 mg by mouth 4 (four) times daily.     Historical Provider, MD  esomeprazole (NEXIUM) 40 MG capsule Take 40 mg by mouth 2 (two) times daily as needed (takes 1 tablet daily, then 1 addt'l tablet if needed).     Historical Provider, MD  fluticasone Asencion Islam) 50 MCG/ACT nasal spray  02/09/14   Historical Provider, MD  HYDROcodone-acetaminophen (NORCO) 7.5-325 MG per tablet  07/30/13   Historical Provider, MD  ibuprofen (ADVIL,MOTRIN) 800 MG tablet Take 800 mg by mouth every 8 (eight) hours as needed. 08/09/14   Historical Provider, MD  levothyroxine (SYNTHROID, LEVOTHROID) 175 MCG tablet Take 175 mcg by mouth daily. 08/12/14   Historical Provider, MD  methocarbamol (ROBAXIN) 500 MG tablet Take 2 tablets (1,000 mg total) by mouth 4 (four) times daily as needed for muscle spasms (muscle spasm/pain). 08/21/14   Francine Graven, DO  montelukast (SINGULAIR) 10 MG tablet Take 10 mg by mouth daily. 02/09/14   Historical Provider, MD  naproxen (NAPROSYN) 500  MG tablet Take 1 tablet (500 mg total) by mouth 2 (two) times daily. 11/25/14   Hanna Patel-Mills, PA-C  PROAIR HFA 108 (90 BASE) MCG/ACT inhaler  06/19/13   Historical Provider, MD  QUEtiapine (SEROQUEL) 300 MG tablet Take 300 mg by mouth at bedtime.    Historical Provider, MD  rOPINIRole (REQUIP) 3 MG tablet Take 4 mg by mouth at bedtime. Takes 3mg  at bedtime, then takes addt'l 3mg  as needed    Historical Provider, MD  simvastatin (ZOCOR) 20 MG tablet Take 20 mg by mouth daily at 6 PM.    Historical Provider, MD   BP 129/80 mmHg  Pulse 92   Temp(Src) 97.5 F (36.4 C) (Oral)  Resp 20  SpO2 98% Physical Exam  Constitutional: She is oriented to person, place, and time. She appears well-developed and well-nourished.  Tearful, appears in pain  HENT:  Head: Normocephalic and atraumatic.  Right Ear: External ear normal.  Left Ear: External ear normal.  Nose: Nose normal.  Eyes: Right eye exhibits no discharge. Left eye exhibits no discharge.  Cardiovascular: Normal rate, regular rhythm and normal heart sounds.   Pulmonary/Chest: Effort normal and breath sounds normal. She exhibits tenderness.    Abdominal: Soft. She exhibits no distension. There is no tenderness.  Neurological: She is alert and oriented to person, place, and time.  Skin: Skin is warm and dry. She is not diaphoretic.  Nursing note and vitals reviewed.   ED Course  Procedures (including critical care time) Labs Review Labs Reviewed - No data to display  Imaging Review Dg Ribs Unilateral W/chest Left  11/25/2014   CLINICAL DATA:  Tripped and fell today.  Left-sided chest pain.  EXAM: LEFT RIBS AND CHEST - 3+ VIEW  COMPARISON:  11/17/2014  FINDINGS: The cardiac silhouette, mediastinal and hilar contours are stable. The lungs are clear. No pleural effusion or pneumothorax.  Dedicated the is of the left ribs do not demonstrate any definite acute rib fractures.  IMPRESSION: No acute cardiopulmonary findings and no definite acute left-sided rib fractures.   Electronically Signed   By: Marijo Sanes M.D.   On: 11/25/2014 12:04   I have personally reviewed and evaluated these images and lab results as part of my medical decision-making.   EKG Interpretation None      MDM   Final diagnoses:  Chest wall contusion, left, initial encounter    Patient's chest wall injury is most likely a contusion with a second negative chest x-ray. Low suspicion for other acute causes of chest pain such as ACS, pulmonary embolism or dissection and given the acute trauma and  reproducibility. Patient feels much better after oral narcotics. Will discharge with short course of this, continue NSAIDs, and continue incentive spirometry. Follow-up with PCP.    Sherwood Gambler, MD 11/27/14 [phone]

## 2014-12-02 DIAGNOSIS — S20212D Contusion of left front wall of thorax, subsequent encounter: Secondary | ICD-10-CM | POA: Diagnosis not present

## 2014-12-16 DIAGNOSIS — K591 Functional diarrhea: Secondary | ICD-10-CM | POA: Diagnosis not present

## 2014-12-16 DIAGNOSIS — R748 Abnormal levels of other serum enzymes: Secondary | ICD-10-CM | POA: Diagnosis not present

## 2015-02-07 DIAGNOSIS — E782 Mixed hyperlipidemia: Secondary | ICD-10-CM | POA: Diagnosis not present

## 2015-02-07 DIAGNOSIS — R05 Cough: Secondary | ICD-10-CM | POA: Diagnosis not present

## 2015-02-07 DIAGNOSIS — E039 Hypothyroidism, unspecified: Secondary | ICD-10-CM | POA: Diagnosis not present

## 2015-02-07 DIAGNOSIS — F411 Generalized anxiety disorder: Secondary | ICD-10-CM | POA: Diagnosis not present

## 2015-02-07 DIAGNOSIS — F329 Major depressive disorder, single episode, unspecified: Secondary | ICD-10-CM | POA: Diagnosis not present

## 2015-02-07 DIAGNOSIS — G259 Extrapyramidal and movement disorder, unspecified: Secondary | ICD-10-CM | POA: Diagnosis not present

## 2015-02-17 DIAGNOSIS — J209 Acute bronchitis, unspecified: Secondary | ICD-10-CM | POA: Diagnosis not present

## 2015-03-23 DIAGNOSIS — R1110 Vomiting, unspecified: Secondary | ICD-10-CM | POA: Diagnosis not present

## 2015-03-23 DIAGNOSIS — R197 Diarrhea, unspecified: Secondary | ICD-10-CM | POA: Diagnosis not present

## 2015-05-09 DIAGNOSIS — G259 Extrapyramidal and movement disorder, unspecified: Secondary | ICD-10-CM | POA: Diagnosis not present

## 2015-05-09 DIAGNOSIS — E782 Mixed hyperlipidemia: Secondary | ICD-10-CM | POA: Diagnosis not present

## 2015-05-09 DIAGNOSIS — E039 Hypothyroidism, unspecified: Secondary | ICD-10-CM | POA: Diagnosis not present

## 2015-05-09 DIAGNOSIS — R05 Cough: Secondary | ICD-10-CM | POA: Diagnosis not present

## 2015-06-01 DIAGNOSIS — S8392XA Sprain of unspecified site of left knee, initial encounter: Secondary | ICD-10-CM | POA: Diagnosis not present

## 2015-06-06 DIAGNOSIS — S8392XA Sprain of unspecified site of left knee, initial encounter: Secondary | ICD-10-CM | POA: Diagnosis not present

## 2015-06-19 DIAGNOSIS — R079 Chest pain, unspecified: Secondary | ICD-10-CM | POA: Diagnosis not present

## 2015-06-22 DIAGNOSIS — J0101 Acute recurrent maxillary sinusitis: Secondary | ICD-10-CM | POA: Diagnosis not present

## 2015-06-22 DIAGNOSIS — J209 Acute bronchitis, unspecified: Secondary | ICD-10-CM | POA: Diagnosis not present

## 2015-08-03 DIAGNOSIS — G259 Extrapyramidal and movement disorder, unspecified: Secondary | ICD-10-CM | POA: Diagnosis not present

## 2015-08-03 DIAGNOSIS — R05 Cough: Secondary | ICD-10-CM | POA: Diagnosis not present

## 2015-08-03 DIAGNOSIS — E782 Mixed hyperlipidemia: Secondary | ICD-10-CM | POA: Diagnosis not present

## 2015-08-03 DIAGNOSIS — K219 Gastro-esophageal reflux disease without esophagitis: Secondary | ICD-10-CM | POA: Diagnosis not present

## 2015-08-03 DIAGNOSIS — E039 Hypothyroidism, unspecified: Secondary | ICD-10-CM | POA: Diagnosis not present

## 2015-08-24 DIAGNOSIS — M720 Palmar fascial fibromatosis [Dupuytren]: Secondary | ICD-10-CM | POA: Diagnosis not present

## 2015-08-24 DIAGNOSIS — D2112 Benign neoplasm of connective and other soft tissue of left upper limb, including shoulder: Secondary | ICD-10-CM | POA: Diagnosis not present

## 2015-09-12 DIAGNOSIS — M720 Palmar fascial fibromatosis [Dupuytren]: Secondary | ICD-10-CM | POA: Diagnosis not present

## 2015-09-12 DIAGNOSIS — M79645 Pain in left finger(s): Secondary | ICD-10-CM | POA: Diagnosis not present

## 2015-09-12 DIAGNOSIS — M1812 Unilateral primary osteoarthritis of first carpometacarpal joint, left hand: Secondary | ICD-10-CM | POA: Diagnosis not present

## 2015-09-13 DIAGNOSIS — E039 Hypothyroidism, unspecified: Secondary | ICD-10-CM | POA: Diagnosis not present

## 2015-09-13 DIAGNOSIS — R197 Diarrhea, unspecified: Secondary | ICD-10-CM | POA: Diagnosis not present

## 2015-09-13 DIAGNOSIS — Z720 Tobacco use: Secondary | ICD-10-CM | POA: Diagnosis not present

## 2015-09-13 DIAGNOSIS — R1084 Generalized abdominal pain: Secondary | ICD-10-CM | POA: Diagnosis not present

## 2015-09-15 DIAGNOSIS — R109 Unspecified abdominal pain: Secondary | ICD-10-CM | POA: Diagnosis not present

## 2015-09-15 DIAGNOSIS — R197 Diarrhea, unspecified: Secondary | ICD-10-CM | POA: Diagnosis not present

## 2015-09-21 DIAGNOSIS — M1812 Unilateral primary osteoarthritis of first carpometacarpal joint, left hand: Secondary | ICD-10-CM | POA: Diagnosis not present

## 2015-10-12 DIAGNOSIS — R197 Diarrhea, unspecified: Secondary | ICD-10-CM | POA: Diagnosis not present

## 2015-10-12 DIAGNOSIS — K210 Gastro-esophageal reflux disease with esophagitis: Secondary | ICD-10-CM | POA: Diagnosis not present

## 2015-10-12 DIAGNOSIS — E039 Hypothyroidism, unspecified: Secondary | ICD-10-CM | POA: Diagnosis not present

## 2015-10-12 DIAGNOSIS — E782 Mixed hyperlipidemia: Secondary | ICD-10-CM | POA: Diagnosis not present

## 2015-10-14 DIAGNOSIS — R1110 Vomiting, unspecified: Secondary | ICD-10-CM | POA: Diagnosis not present

## 2015-10-14 DIAGNOSIS — E039 Hypothyroidism, unspecified: Secondary | ICD-10-CM | POA: Diagnosis not present

## 2015-10-14 DIAGNOSIS — R110 Nausea: Secondary | ICD-10-CM | POA: Diagnosis not present

## 2015-10-14 DIAGNOSIS — R197 Diarrhea, unspecified: Secondary | ICD-10-CM | POA: Diagnosis not present

## 2015-10-14 DIAGNOSIS — E782 Mixed hyperlipidemia: Secondary | ICD-10-CM | POA: Diagnosis not present

## 2015-10-14 DIAGNOSIS — R109 Unspecified abdominal pain: Secondary | ICD-10-CM | POA: Diagnosis not present

## 2015-10-14 DIAGNOSIS — Z9049 Acquired absence of other specified parts of digestive tract: Secondary | ICD-10-CM | POA: Diagnosis not present

## 2015-10-14 DIAGNOSIS — K210 Gastro-esophageal reflux disease with esophagitis: Secondary | ICD-10-CM | POA: Diagnosis not present

## 2015-10-14 DIAGNOSIS — Z9889 Other specified postprocedural states: Secondary | ICD-10-CM | POA: Diagnosis not present

## 2015-10-25 DIAGNOSIS — L814 Other melanin hyperpigmentation: Secondary | ICD-10-CM | POA: Diagnosis not present

## 2015-10-25 DIAGNOSIS — I781 Nevus, non-neoplastic: Secondary | ICD-10-CM | POA: Diagnosis not present

## 2015-10-25 DIAGNOSIS — L821 Other seborrheic keratosis: Secondary | ICD-10-CM | POA: Diagnosis not present

## 2015-10-25 DIAGNOSIS — L918 Other hypertrophic disorders of the skin: Secondary | ICD-10-CM | POA: Diagnosis not present

## 2015-10-25 DIAGNOSIS — H0263 Xanthelasma of right eye, unspecified eyelid: Secondary | ICD-10-CM | POA: Diagnosis not present

## 2015-10-25 DIAGNOSIS — D229 Melanocytic nevi, unspecified: Secondary | ICD-10-CM | POA: Diagnosis not present

## 2015-11-17 DIAGNOSIS — R42 Dizziness and giddiness: Secondary | ICD-10-CM | POA: Diagnosis not present

## 2015-11-17 DIAGNOSIS — R0781 Pleurodynia: Secondary | ICD-10-CM | POA: Diagnosis not present

## 2015-11-17 DIAGNOSIS — S299XXA Unspecified injury of thorax, initial encounter: Secondary | ICD-10-CM | POA: Diagnosis not present

## 2015-11-17 DIAGNOSIS — R110 Nausea: Secondary | ICD-10-CM | POA: Diagnosis not present

## 2015-12-27 DIAGNOSIS — M545 Low back pain: Secondary | ICD-10-CM | POA: Diagnosis not present

## 2015-12-27 DIAGNOSIS — M542 Cervicalgia: Secondary | ICD-10-CM | POA: Diagnosis not present

## 2016-01-16 DIAGNOSIS — E782 Mixed hyperlipidemia: Secondary | ICD-10-CM | POA: Diagnosis not present

## 2016-01-16 DIAGNOSIS — M79641 Pain in right hand: Secondary | ICD-10-CM | POA: Diagnosis not present

## 2016-01-16 DIAGNOSIS — E039 Hypothyroidism, unspecified: Secondary | ICD-10-CM | POA: Diagnosis not present

## 2016-01-16 DIAGNOSIS — I10 Essential (primary) hypertension: Secondary | ICD-10-CM | POA: Diagnosis not present

## 2016-01-16 DIAGNOSIS — Z23 Encounter for immunization: Secondary | ICD-10-CM | POA: Diagnosis not present

## 2016-01-19 DIAGNOSIS — Z981 Arthrodesis status: Secondary | ICD-10-CM | POA: Diagnosis not present

## 2016-01-19 DIAGNOSIS — M545 Low back pain: Secondary | ICD-10-CM | POA: Diagnosis not present

## 2016-01-19 DIAGNOSIS — M79605 Pain in left leg: Secondary | ICD-10-CM | POA: Diagnosis not present

## 2016-01-19 DIAGNOSIS — M9943 Connective tissue stenosis of neural canal of lumbar region: Secondary | ICD-10-CM | POA: Diagnosis not present

## 2016-02-22 ENCOUNTER — Telehealth: Payer: Self-pay | Admitting: Internal Medicine

## 2016-02-22 NOTE — Telephone Encounter (Signed)
[employer] [phone] ext [phone]  Calling about paperwork provider query

## 2016-02-27 NOTE — Telephone Encounter (Signed)
I spoke to [facility]. She states she is calling in regards to [place] rec'ing PA forms. I advised her to refax. She voiced understanding.

## 2016-03-09 ENCOUNTER — Telehealth: Payer: Self-pay | Admitting: Internal Medicine

## 2016-03-09 NOTE — Telephone Encounter (Signed)
[employer] called need signature on formed sent to our office for patient. [phone]  Ex [phone]

## 2016-03-12 DIAGNOSIS — I10 Essential (primary) hypertension: Secondary | ICD-10-CM | POA: Diagnosis not present

## 2016-03-12 DIAGNOSIS — L918 Other hypertrophic disorders of the skin: Secondary | ICD-10-CM | POA: Diagnosis not present

## 2016-03-12 DIAGNOSIS — Z79899 Other long term (current) drug therapy: Secondary | ICD-10-CM | POA: Diagnosis not present

## 2016-03-12 DIAGNOSIS — K219 Gastro-esophageal reflux disease without esophagitis: Secondary | ICD-10-CM | POA: Diagnosis not present

## 2016-03-15 NOTE — Telephone Encounter (Signed)
Spoke with Olin Hauser @ [employer] to refax form to get signature

## 2016-03-18 DIAGNOSIS — E039 Hypothyroidism, unspecified: Secondary | ICD-10-CM | POA: Diagnosis not present

## 2016-03-18 DIAGNOSIS — M5431 Sciatica, right side: Secondary | ICD-10-CM | POA: Diagnosis not present

## 2016-03-18 DIAGNOSIS — M545 Low back pain: Secondary | ICD-10-CM | POA: Diagnosis not present

## 2016-03-18 DIAGNOSIS — Z981 Arthrodesis status: Secondary | ICD-10-CM | POA: Diagnosis not present

## 2016-03-18 DIAGNOSIS — Z79899 Other long term (current) drug therapy: Secondary | ICD-10-CM | POA: Diagnosis not present

## 2016-03-18 DIAGNOSIS — K219 Gastro-esophageal reflux disease without esophagitis: Secondary | ICD-10-CM | POA: Diagnosis not present

## 2016-03-18 DIAGNOSIS — I10 Essential (primary) hypertension: Secondary | ICD-10-CM | POA: Diagnosis not present

## 2016-03-18 DIAGNOSIS — M5441 Lumbago with sciatica, right side: Secondary | ICD-10-CM | POA: Diagnosis not present

## 2016-03-23 IMAGING — US US SOFT TISSUE HEAD/NECK
1 series · 14 of 25 positions shown · non-contrast
Comparison: 03/17/2012 and 12/12/2007.

CLINICAL DATA: Followup of thyroid nodule.

EXAM:
THYROID ULTRASOUND
TECHNIQUE: Ultrasound examination of the thyroid gland and adjacent soft
tissues was performed.

[Series 1: us soft tissue head/neck · 0.09mm/px · 14 of 48 slices shown]
[im 1/48]
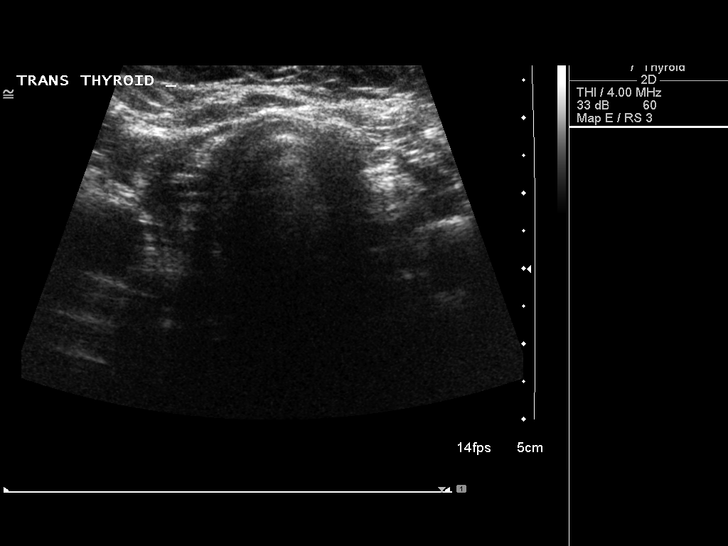
[im 4/48]
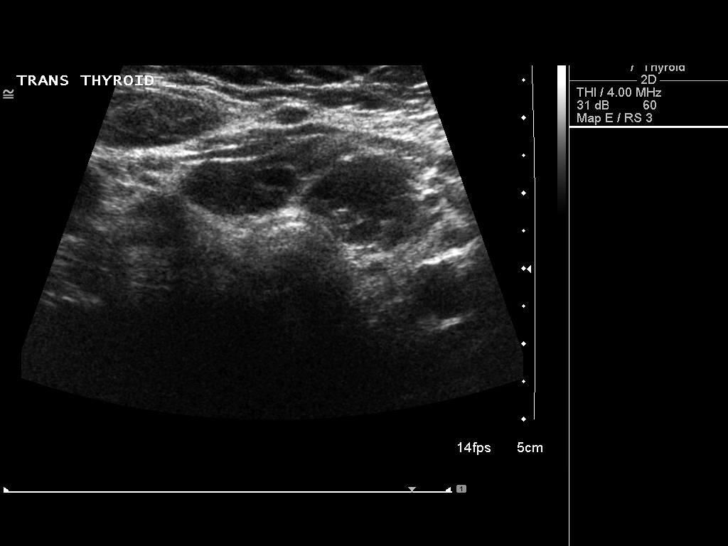
[im 8/48]
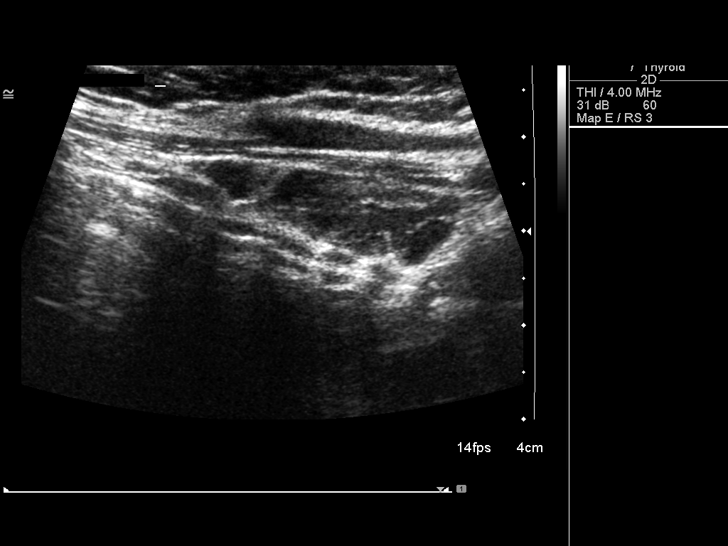
[im 12/48]
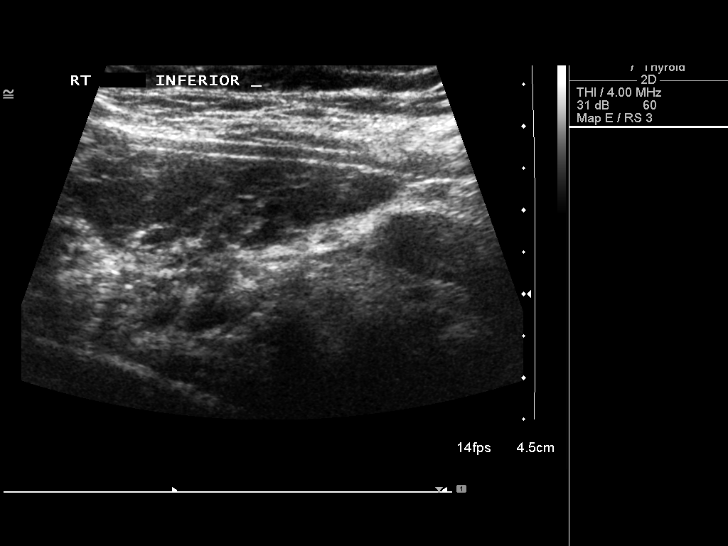
[im 16/48]
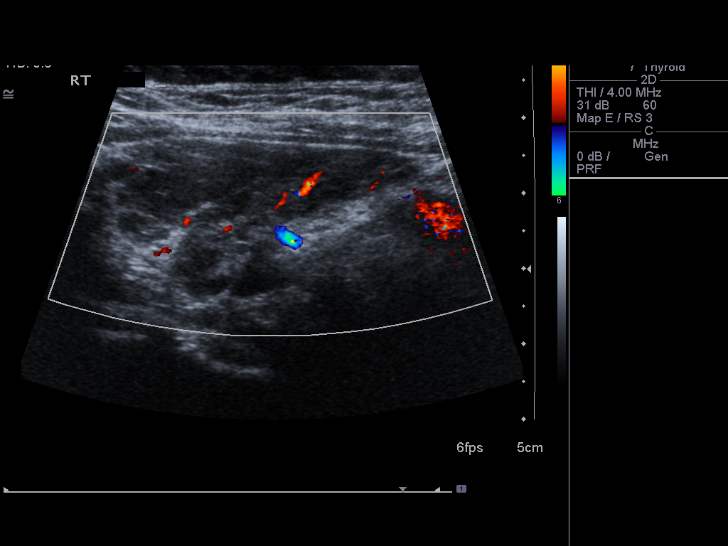
[im 18/48]
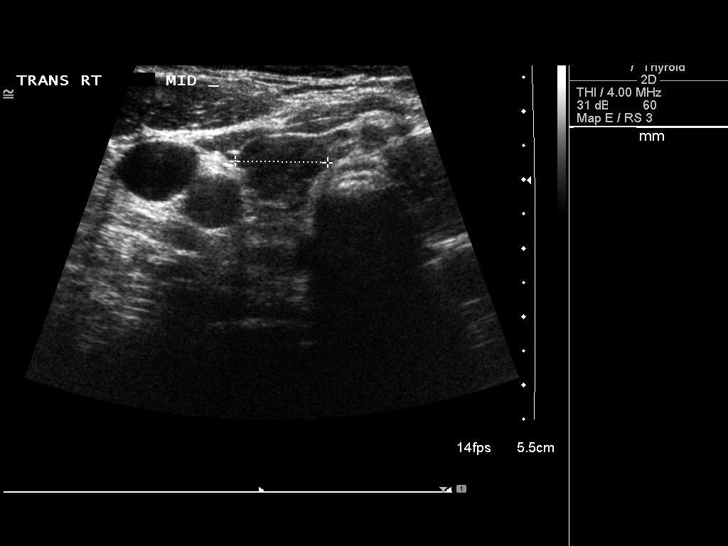
[im 22/48]
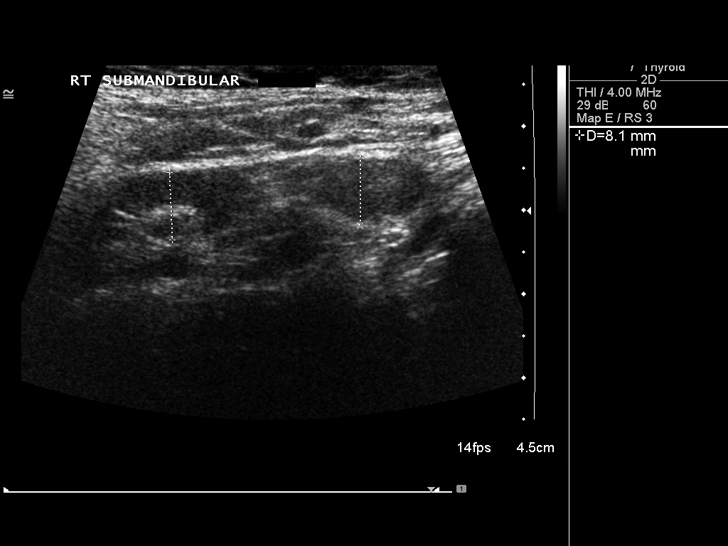
[im 26/48]
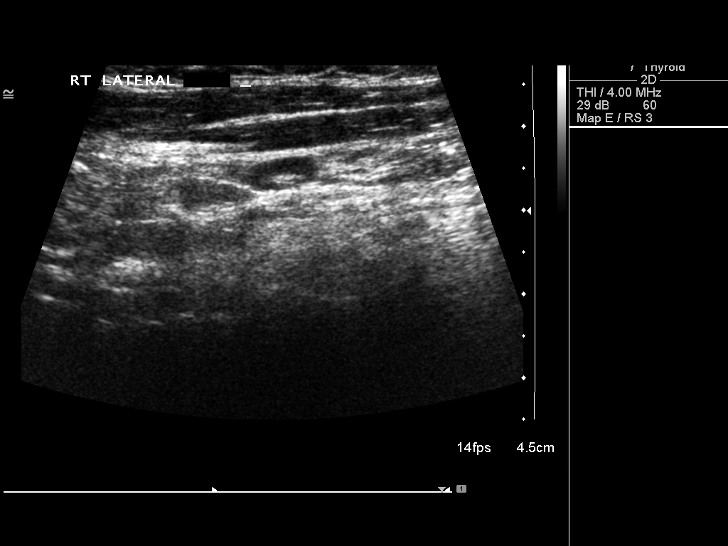
[im 30/48]
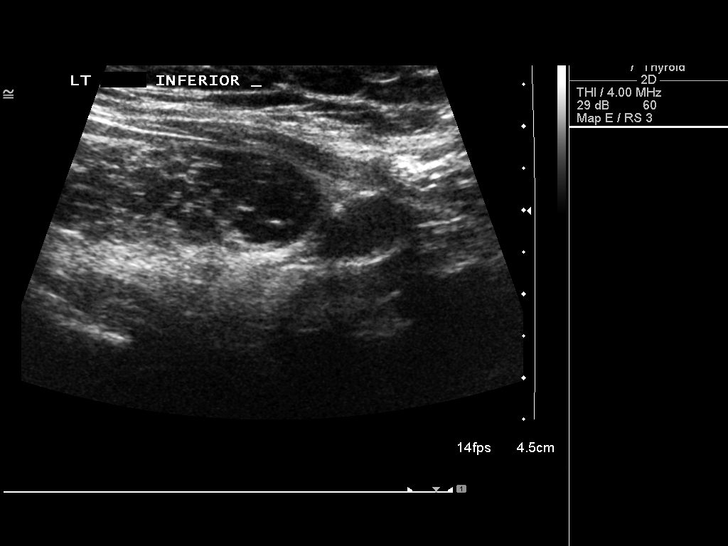
[im 32/48]
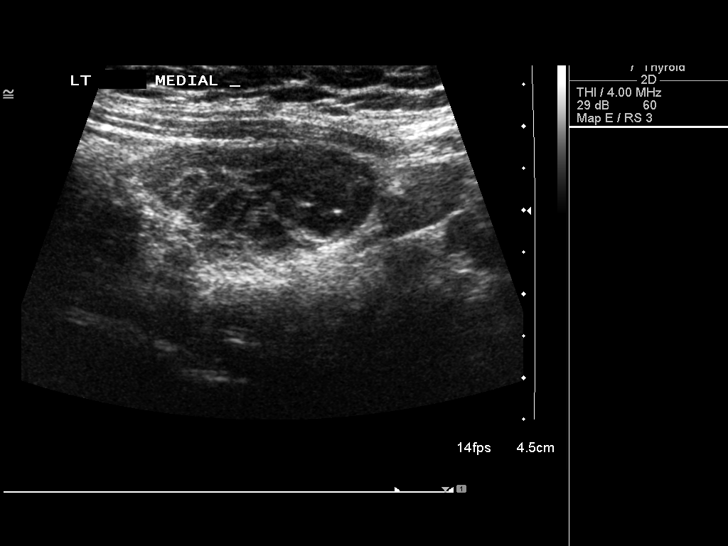
[im 36/48]
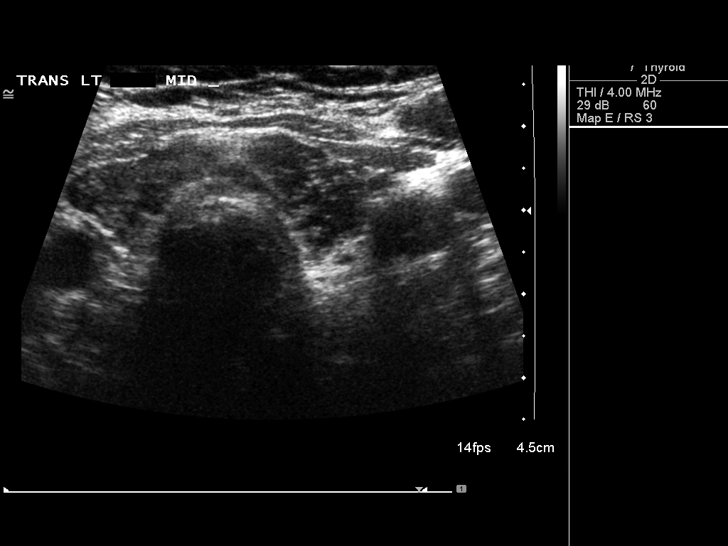
[im 40/48]
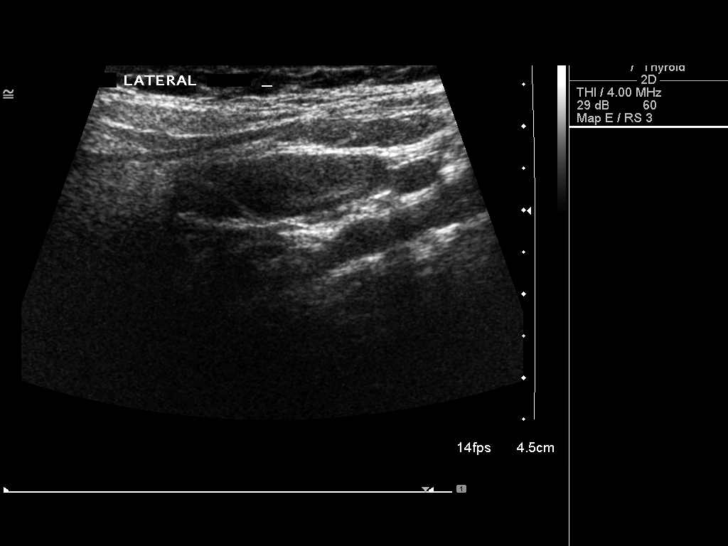
[im 44/48]
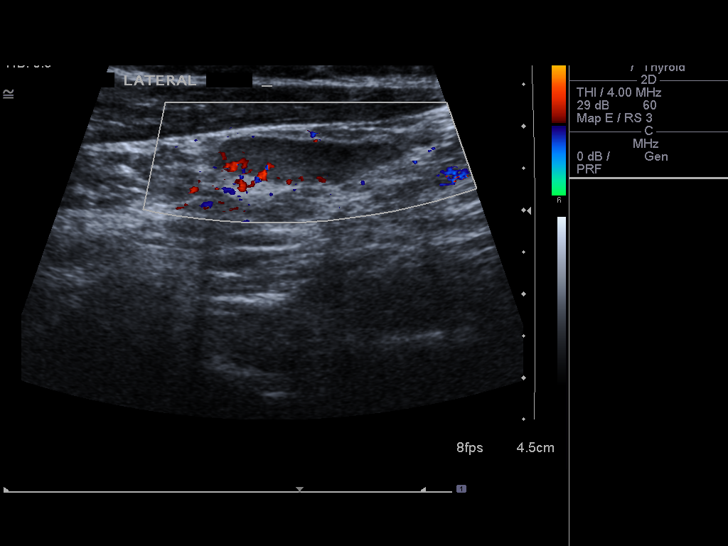
[im 48/48]
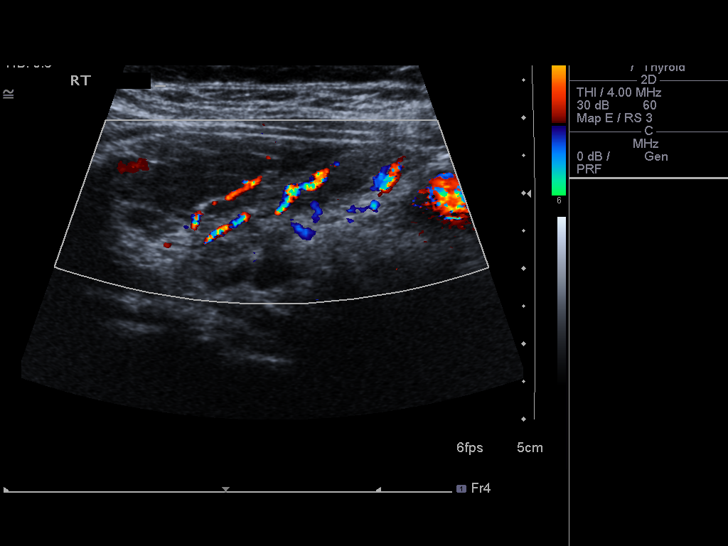

[14 of 25 positions shown; findings below may reference images not displayed]

FINDINGS: Right thyroid lobe

Measurements: 4.4 x 2.3 x 1.4 cm.  No nodules visualized.

Left thyroid lobe

Measurements: 4.8 x 1.3 x 1.2 cm.  No nodules visualized.

Isthmus

Thickness: 0.9 cm. No nodules visualized. Cystic area in the isthmus
seen on the prior study is no longer visualized.

Thyroid dimensions are stable and the thyroid gland shows stable
heterogeneous echotexture. No abnormal vascularity is identified.

Lymphadenopathy

None visualized.
IMPRESSION: Stable heterogeneous thyroid gland without focal nodule.

## 2016-04-17 DIAGNOSIS — M545 Low back pain: Secondary | ICD-10-CM | POA: Diagnosis not present

## 2016-04-17 DIAGNOSIS — R05 Cough: Secondary | ICD-10-CM | POA: Diagnosis not present

## 2016-04-17 DIAGNOSIS — E039 Hypothyroidism, unspecified: Secondary | ICD-10-CM | POA: Diagnosis not present

## 2016-04-17 DIAGNOSIS — E782 Mixed hyperlipidemia: Secondary | ICD-10-CM | POA: Diagnosis not present

## 2016-04-19 DIAGNOSIS — R05 Cough: Secondary | ICD-10-CM | POA: Diagnosis not present

## 2016-04-19 DIAGNOSIS — J018 Other acute sinusitis: Secondary | ICD-10-CM | POA: Diagnosis not present

## 2016-07-16 DIAGNOSIS — R5381 Other malaise: Secondary | ICD-10-CM | POA: Diagnosis not present

## 2016-07-16 DIAGNOSIS — I10 Essential (primary) hypertension: Secondary | ICD-10-CM | POA: Diagnosis not present

## 2016-07-16 DIAGNOSIS — Z8249 Family history of ischemic heart disease and other diseases of the circulatory system: Secondary | ICD-10-CM | POA: Diagnosis not present

## 2016-07-16 DIAGNOSIS — M545 Low back pain: Secondary | ICD-10-CM | POA: Diagnosis not present

## 2016-07-16 DIAGNOSIS — R404 Transient alteration of awareness: Secondary | ICD-10-CM | POA: Diagnosis not present

## 2016-07-16 DIAGNOSIS — G259 Extrapyramidal and movement disorder, unspecified: Secondary | ICD-10-CM | POA: Diagnosis not present

## 2016-07-16 DIAGNOSIS — M1990 Unspecified osteoarthritis, unspecified site: Secondary | ICD-10-CM | POA: Diagnosis not present

## 2016-07-16 DIAGNOSIS — E039 Hypothyroidism, unspecified: Secondary | ICD-10-CM | POA: Diagnosis not present

## 2016-07-16 DIAGNOSIS — R55 Syncope and collapse: Secondary | ICD-10-CM | POA: Diagnosis not present

## 2016-07-16 DIAGNOSIS — E782 Mixed hyperlipidemia: Secondary | ICD-10-CM | POA: Diagnosis not present

## 2016-07-16 DIAGNOSIS — R42 Dizziness and giddiness: Secondary | ICD-10-CM | POA: Diagnosis not present

## 2016-07-16 DIAGNOSIS — Z79899 Other long term (current) drug therapy: Secondary | ICD-10-CM | POA: Diagnosis not present

## 2016-07-18 DIAGNOSIS — M7542 Impingement syndrome of left shoulder: Secondary | ICD-10-CM | POA: Diagnosis not present

## 2016-07-18 DIAGNOSIS — G5602 Carpal tunnel syndrome, left upper limb: Secondary | ICD-10-CM | POA: Diagnosis not present

## 2016-07-18 DIAGNOSIS — M25512 Pain in left shoulder: Secondary | ICD-10-CM | POA: Diagnosis not present

## 2016-07-18 DIAGNOSIS — M720 Palmar fascial fibromatosis [Dupuytren]: Secondary | ICD-10-CM | POA: Diagnosis not present

## 2016-07-25 DIAGNOSIS — R42 Dizziness and giddiness: Secondary | ICD-10-CM | POA: Diagnosis not present

## 2016-07-25 DIAGNOSIS — H538 Other visual disturbances: Secondary | ICD-10-CM | POA: Diagnosis not present

## 2016-07-25 DIAGNOSIS — R55 Syncope and collapse: Secondary | ICD-10-CM | POA: Diagnosis not present

## 2016-07-25 DIAGNOSIS — R51 Headache: Secondary | ICD-10-CM | POA: Diagnosis not present

## 2016-08-06 ENCOUNTER — Encounter (INDEPENDENT_AMBULATORY_CARE_PROVIDER_SITE_OTHER): Payer: Self-pay

## 2016-08-06 ENCOUNTER — Encounter (INDEPENDENT_AMBULATORY_CARE_PROVIDER_SITE_OTHER): Payer: Self-pay | Admitting: Internal Medicine

## 2016-08-06 DIAGNOSIS — M720 Palmar fascial fibromatosis [Dupuytren]: Secondary | ICD-10-CM | POA: Diagnosis not present

## 2016-08-20 ENCOUNTER — Other Ambulatory Visit: Payer: Self-pay

## 2016-08-20 ENCOUNTER — Telehealth: Payer: Self-pay | Admitting: Neurology

## 2016-08-20 ENCOUNTER — Ambulatory Visit (INDEPENDENT_AMBULATORY_CARE_PROVIDER_SITE_OTHER): Payer: Medicare Other | Admitting: Neurology

## 2016-08-20 ENCOUNTER — Encounter: Payer: Self-pay | Admitting: Neurology

## 2016-08-20 VITALS — BP 97/67 | HR 74 | Ht 66.0 in | Wt 218.0 lb

## 2016-08-20 DIAGNOSIS — G4486 Cervicogenic headache: Secondary | ICD-10-CM

## 2016-08-20 DIAGNOSIS — R42 Dizziness and giddiness: Secondary | ICD-10-CM | POA: Insufficient documentation

## 2016-08-20 DIAGNOSIS — R51 Headache: Secondary | ICD-10-CM

## 2016-08-20 HISTORY — DX: Cervicogenic headache: G44.86

## 2016-08-20 MED ORDER — GABAPENTIN 300 MG PO CAPS: ORAL_CAPSULE | ORAL | 3 refills | Status: DC

## 2016-08-20 NOTE — Progress Notes (Signed)
Reason for visit: Cervicogenic headache  Referring physician: Dr. Cristine Polio Katherine Hayden is a 51 y.o. female  History of present illness:  Katherine Hayden is a 51 year old right-handed white female with a history of headaches dating back several years. The patient has had chronic neck pain and she will get one to 2 headaches a week coming up from the back of the neck going into the head. The patient has had worsening of headaches over the last 3-4 months, she is now having daily headaches, the neck pain has increased, particularly on the left side. The patient will have pain into the left shoulder, she denies pain down the arms. The patient feels that the arms are heavy at times. She reports no numbness or weakness of the extremities. She does report some photophobia with the headache, she denies phonophobia. She has nausea and very occasionally she will have vomiting. The patient indicates that when the neck is painful, the headaches are worse. The patient will get a bandlike sensation as if someone is squeezing her head around the back and front of the head. The patient has had chronic low back pain as well, she has had prior lumbosacral spine surgery on 4 occasions. She denies any problems controlling the bowels or the bladder, she denies any balance issues or falls. She takes Motrin and Goody powders for the headache, she has baclofen to take but she does not take this regularly. She is sent to this office for an evaluation. MRI of the brain done recently at [facility] shows mild to moderate white matter changes, no acute changes were seen. This study was done on 07/25/2016.  Past Medical History:  Diagnosis Date  . Asthma   . Chronic headaches   . Depression   . Emphysema   . Emphysema of lung ([place])   . Hypertension   . Thyroid disease     Past Surgical History:  Procedure Laterality Date  . APPENDECTOMY    . BACK SURGERY    . COLONOSCOPY W/ POLYPECTOMY  07/2013   1  polyp removal  . CYST REMOVAL HAND  2015    Family History  Problem Relation Age of Onset  . Hypertension Mother   . Hypertension Father   . Heart disease Maternal Grandfather   . Heart disease Paternal Grandmother     Social history:  reports that she has been smoking Cigarettes.  She has been smoking about 0.50 packs per day. She has never used smokeless tobacco. She reports that she drinks alcohol. She reports that she does not use drugs.  Medications:  Prior to Admission medications   Medication Sig Start Date End Date Taking? Authorizing Provider  ALPRAZolam Duanne Moron) 1 MG tablet Take 1 mg by mouth 4 (four) times daily.    Yes [provider]  baclofen (LIORESAL) 10 MG tablet TAKE 1 TABLET BY MOUTH TWICE DAILY AS NEEDED FOR MUSCLE SPASMS 08/16/16  Yes [provider]  dicyclomine (BENTYL) 20 MG tablet Take 20 mg by mouth 3 (three) times daily as needed. 08/16/16  Yes [provider]  escitalopram (LEXAPRO) 20 MG tablet Take 20 mg by mouth daily.   Yes [provider]  esomeprazole (NEXIUM) 40 MG capsule Take 40 mg by mouth 2 (two) times daily as needed (takes 1 tablet daily, then 1 addt'l tablet if needed).    Yes [provider]  HYDROcodone-acetaminophen (NORCO) 5-325 MG per tablet Take 1-2 tablets by mouth every 6 (six) hours as  needed for severe pain. 11/27/14  Yes Sherwood Gambler, MD  levothyroxine (SYNTHROID, LEVOTHROID) 175 MCG tablet Take 175 mcg by mouth daily. 08/12/14  Yes [provider]  lisinopril-hydrochlorothiazide (PRINZIDE,ZESTORETIC) 20-12.5 MG per tablet Take 1 tablet by mouth daily. 11/20/14  Yes [provider]  montelukast (SINGULAIR) 10 MG tablet Take 10 mg by mouth daily. 02/09/14  Yes [provider]  polyethylene glycol powder (GLYCOLAX/MIRALAX) powder Take 17 g by mouth daily as needed. constipation 11/17/14  Yes [provider]  PROAIR HFA 108 (90 BASE) MCG/ACT inhaler Inhale 2 puffs  into the lungs every 4 (four) hours as needed for wheezing or shortness of breath.  06/19/13  Yes [provider]  QUEtiapine (SEROQUEL) 300 MG tablet Take 300 mg by mouth at bedtime.   Yes [provider]  rOPINIRole (REQUIP) 4 MG tablet Take 4 mg by mouth 2 (two) times daily.    Yes [provider]  simvastatin (ZOCOR) 20 MG tablet Take 20 mg by mouth daily at 6 PM.   Yes [provider]  valACYclovir (VALTREX) 1000 MG tablet TAKE 1 TABLET BY MOUTH DAILY as needed 06/13/16  Yes [provider]      Allergies  Allergen Reactions  . Pneumococcal Vaccines Itching and Other (See Comments)    Patient received Influenza and Pneumococcal vaccines at the same time-caused severe itching and needs medication to reverse reaction, this caused her to go into cardiac arrest at that time.    Marland Kitchen Amoxicillin Hives  . Codeine Nausea And Vomiting  . Hydromorphone Hcl Nausea And Vomiting  . Sulfonamide Derivatives Hives  . Hydrocodone-Acetaminophen Nausea Only    ROS:  Out of a complete 14 system review of symptoms, the patient complains only of the following symptoms, and all other reviewed systems are negative.  Weight gain Itching, moles Snoring Diarrhea Feeling hot, increased thirst Joint pain, achy muscles Memory loss, headache Depression, not enough sleep, decreased energy, disinterest in activities Insomnia, restless legs  Blood pressure 97/67, pulse 74, height 5\' 6"  (1.676 m), weight 218 lb (98.9 kg).  Physical Exam  General: The patient is alert and cooperative at the time of the examination. The patient is markedly obese.  Eyes: Pupils are equal, round, and reactive to light. Discs are flat bilaterally.  Neck: The neck is supple, no carotid bruits are noted.  Respiratory: The respiratory examination is clear.  Cardiovascular: The cardiovascular examination reveals a regular rate and rhythm, no obvious murmurs or rubs are  noted.  Neuromuscular: The patient lacks only about 10 of full lateral rotation of the cervical spine bilaterally.  Skin: Extremities are without significant edema.  Neurologic Exam  Mental status: The patient is alert and oriented x 3 at the time of the examination. The patient has apparent normal recent and remote memory, with an apparently normal attention span and concentration ability.  Cranial nerves: Facial symmetry is present. There is good sensation of the face to pinprick and soft touch bilaterally. The strength of the facial muscles and the muscles to head turning and shoulder shrug are normal bilaterally. Speech is well enunciated, no aphasia or dysarthria is noted. Extraocular movements are full. Visual fields are full. The tongue is midline, and the patient has symmetric elevation of the soft palate. No obvious hearing deficits are noted.  Motor: The motor testing reveals 5 over 5 strength of all 4 extremities. Good symmetric motor tone is noted throughout.  Sensory: Sensory testing is intact to pinprick, soft touch, vibration  sensation, and position sense on all 4 extremities. No evidence of extinction is noted.  Coordination: Cerebellar testing reveals good finger-nose-finger and heel-to-shin bilaterally.  Gait and station: Gait is normal. Tandem gait is normal. Romberg is negative. No drift is seen.  Reflexes: Deep tendon reflexes are symmetric and normal bilaterally. Toes are downgoing bilaterally.   Assessment/Plan:  1. Cervicogenic headache  2. Dizziness  The patient is having dizziness associated with her headache that appears to be coming up from the neck. The patient has had MRI of the brain. The patient will be placed back on baclofen taking 5 mg twice daily, she will be placed on gabapentin starting at 300 mg twice daily for 2 weeks then go to 300 mg the morning and 600 mg in the evening. She will be set up for neuromuscular therapy on the neck and shoulders. If  she is not improving over the next 6 weeks, she is to contact our office and we will get her set up for MRI evaluation of the cervical spine. She will follow-up in 3 months.  Jill Alexanders MD 08/20/2016 2:07 PM  [facility] [address] [place], [place] [address]  Phone [phone] Fax [phone]

## 2016-08-20 NOTE — Patient Instructions (Signed)
   We will get physical therapy on the cervical spine. We will start gabapentin for the pain, and start the baclofen back taking 1/2 tablet twice a day.

## 2016-08-20 NOTE — Progress Notes (Signed)
Rx resent to correct pharmacy

## 2016-08-20 NOTE — Telephone Encounter (Signed)
Pt wants to confirm that the prescription was called into  [facility], [place], [place] [phone] (Phone) [phone] (Fax)   Pt said she called there and they have not received the order.  She wants to make sure that it did not go to the [facility] in [place], please call

## 2016-08-27 DIAGNOSIS — R42 Dizziness and giddiness: Secondary | ICD-10-CM | POA: Diagnosis not present

## 2016-08-27 DIAGNOSIS — R51 Headache: Secondary | ICD-10-CM | POA: Diagnosis not present

## 2016-08-28 ENCOUNTER — Ambulatory Visit (INDEPENDENT_AMBULATORY_CARE_PROVIDER_SITE_OTHER): Payer: Self-pay | Admitting: Internal Medicine

## 2016-08-29 ENCOUNTER — Telehealth: Payer: Self-pay | Admitting: *Deleted

## 2016-08-29 DIAGNOSIS — M25512 Pain in left shoulder: Secondary | ICD-10-CM | POA: Diagnosis not present

## 2016-08-29 DIAGNOSIS — M720 Palmar fascial fibromatosis [Dupuytren]: Secondary | ICD-10-CM | POA: Diagnosis not present

## 2016-08-29 DIAGNOSIS — G5602 Carpal tunnel syndrome, left upper limb: Secondary | ICD-10-CM | POA: Diagnosis not present

## 2016-08-29 DIAGNOSIS — M7542 Impingement syndrome of left shoulder: Secondary | ICD-10-CM | POA: Diagnosis not present

## 2016-08-29 NOTE — Telephone Encounter (Signed)
Faxed signed orders back to [facility] rehab services. Fax: [phone]. Received confirmation.

## 2016-08-30 DIAGNOSIS — R42 Dizziness and giddiness: Secondary | ICD-10-CM | POA: Diagnosis not present

## 2016-08-30 DIAGNOSIS — R51 Headache: Secondary | ICD-10-CM | POA: Diagnosis not present

## 2016-09-05 ENCOUNTER — Encounter (INDEPENDENT_AMBULATORY_CARE_PROVIDER_SITE_OTHER): Payer: Self-pay | Admitting: Internal Medicine

## 2016-09-05 ENCOUNTER — Ambulatory Visit (INDEPENDENT_AMBULATORY_CARE_PROVIDER_SITE_OTHER): Payer: Self-pay | Admitting: Internal Medicine

## 2016-09-05 DIAGNOSIS — M25512 Pain in left shoulder: Secondary | ICD-10-CM | POA: Diagnosis not present

## 2016-09-10 ENCOUNTER — Encounter (INDEPENDENT_AMBULATORY_CARE_PROVIDER_SITE_OTHER): Payer: Self-pay

## 2016-09-12 DIAGNOSIS — M792 Neuralgia and neuritis, unspecified: Secondary | ICD-10-CM | POA: Diagnosis not present

## 2016-09-12 DIAGNOSIS — M542 Cervicalgia: Secondary | ICD-10-CM | POA: Diagnosis not present

## 2016-09-20 DIAGNOSIS — M50322 Other cervical disc degeneration at C5-C6 level: Secondary | ICD-10-CM | POA: Diagnosis not present

## 2016-09-20 DIAGNOSIS — M47812 Spondylosis without myelopathy or radiculopathy, cervical region: Secondary | ICD-10-CM | POA: Diagnosis not present

## 2016-09-20 DIAGNOSIS — M792 Neuralgia and neuritis, unspecified: Secondary | ICD-10-CM | POA: Diagnosis not present

## 2016-09-20 DIAGNOSIS — M542 Cervicalgia: Secondary | ICD-10-CM | POA: Diagnosis not present

## 2016-09-23 DIAGNOSIS — Z8249 Family history of ischemic heart disease and other diseases of the circulatory system: Secondary | ICD-10-CM | POA: Diagnosis not present

## 2016-09-23 DIAGNOSIS — M1990 Unspecified osteoarthritis, unspecified site: Secondary | ICD-10-CM | POA: Diagnosis not present

## 2016-09-23 DIAGNOSIS — Z79899 Other long term (current) drug therapy: Secondary | ICD-10-CM | POA: Diagnosis not present

## 2016-09-23 DIAGNOSIS — K219 Gastro-esophageal reflux disease without esophagitis: Secondary | ICD-10-CM | POA: Diagnosis not present

## 2016-09-23 DIAGNOSIS — M5020 Other cervical disc displacement, unspecified cervical region: Secondary | ICD-10-CM | POA: Diagnosis not present

## 2016-09-23 DIAGNOSIS — M47812 Spondylosis without myelopathy or radiculopathy, cervical region: Secondary | ICD-10-CM | POA: Diagnosis not present

## 2016-09-23 DIAGNOSIS — M479 Spondylosis, unspecified: Secondary | ICD-10-CM | POA: Diagnosis not present

## 2016-09-23 DIAGNOSIS — I10 Essential (primary) hypertension: Secondary | ICD-10-CM | POA: Diagnosis not present

## 2016-09-29 DIAGNOSIS — M533 Sacrococcygeal disorders, not elsewhere classified: Secondary | ICD-10-CM | POA: Diagnosis not present

## 2016-09-29 DIAGNOSIS — I10 Essential (primary) hypertension: Secondary | ICD-10-CM | POA: Diagnosis not present

## 2016-09-29 DIAGNOSIS — Z882 Allergy status to sulfonamides status: Secondary | ICD-10-CM | POA: Diagnosis not present

## 2016-09-29 DIAGNOSIS — Z881 Allergy status to other antibiotic agents status: Secondary | ICD-10-CM | POA: Diagnosis not present

## 2016-09-29 DIAGNOSIS — Z79899 Other long term (current) drug therapy: Secondary | ICD-10-CM | POA: Diagnosis not present

## 2016-09-29 DIAGNOSIS — K219 Gastro-esophageal reflux disease without esophagitis: Secondary | ICD-10-CM | POA: Diagnosis not present

## 2016-09-29 DIAGNOSIS — Z885 Allergy status to narcotic agent status: Secondary | ICD-10-CM | POA: Diagnosis not present

## 2016-10-16 DIAGNOSIS — M5412 Radiculopathy, cervical region: Secondary | ICD-10-CM | POA: Diagnosis not present

## 2016-10-18 DIAGNOSIS — I10 Essential (primary) hypertension: Secondary | ICD-10-CM | POA: Diagnosis not present

## 2016-10-18 DIAGNOSIS — R079 Chest pain, unspecified: Secondary | ICD-10-CM | POA: Diagnosis not present

## 2016-10-18 DIAGNOSIS — R0602 Shortness of breath: Secondary | ICD-10-CM | POA: Diagnosis not present

## 2016-10-18 DIAGNOSIS — Z79899 Other long term (current) drug therapy: Secondary | ICD-10-CM | POA: Diagnosis not present

## 2016-10-18 DIAGNOSIS — R0789 Other chest pain: Secondary | ICD-10-CM | POA: Diagnosis not present

## 2016-10-18 DIAGNOSIS — R531 Weakness: Secondary | ICD-10-CM | POA: Diagnosis not present

## 2016-10-18 DIAGNOSIS — K219 Gastro-esophageal reflux disease without esophagitis: Secondary | ICD-10-CM | POA: Diagnosis not present

## 2016-10-25 DIAGNOSIS — M545 Low back pain: Secondary | ICD-10-CM | POA: Diagnosis not present

## 2016-11-16 DIAGNOSIS — M5136 Other intervertebral disc degeneration, lumbar region: Secondary | ICD-10-CM | POA: Diagnosis not present

## 2016-11-16 DIAGNOSIS — M542 Cervicalgia: Secondary | ICD-10-CM | POA: Diagnosis not present

## 2016-11-24 DIAGNOSIS — I10 Essential (primary) hypertension: Secondary | ICD-10-CM | POA: Diagnosis not present

## 2016-11-24 DIAGNOSIS — K219 Gastro-esophageal reflux disease without esophagitis: Secondary | ICD-10-CM | POA: Diagnosis not present

## 2016-11-24 DIAGNOSIS — Z79899 Other long term (current) drug therapy: Secondary | ICD-10-CM | POA: Diagnosis not present

## 2016-11-24 DIAGNOSIS — M5412 Radiculopathy, cervical region: Secondary | ICD-10-CM | POA: Diagnosis not present

## 2016-12-04 ENCOUNTER — Ambulatory Visit (INDEPENDENT_AMBULATORY_CARE_PROVIDER_SITE_OTHER): Payer: Medicare Other | Admitting: Adult Health

## 2016-12-04 ENCOUNTER — Encounter: Payer: Self-pay | Admitting: Adult Health

## 2016-12-04 VITALS — BP 127/68 | HR 84 | Ht 66.0 in | Wt 226.2 lb

## 2016-12-04 DIAGNOSIS — R51 Headache: Secondary | ICD-10-CM

## 2016-12-04 DIAGNOSIS — M542 Cervicalgia: Secondary | ICD-10-CM | POA: Diagnosis not present

## 2016-12-04 DIAGNOSIS — G4486 Cervicogenic headache: Secondary | ICD-10-CM

## 2016-12-04 MED ORDER — TOPIRAMATE 25 MG PO TABS: 25.0000 mg | ORAL_TABLET | Freq: Every day | ORAL | 5 refills | Status: DC

## 2016-12-04 NOTE — Patient Instructions (Signed)
Your Plan:  Continue Baclofen for neck pain Have imaging sent to our office  Once we have the images we will contact you with the next step in treatment If your symptoms worsen or you develop new symptoms please let us know.   Thank you for coming to see [place] at [facility]. I hope we have been able to provide you high quality care today.  You may receive a patient satisfaction survey over the next few weeks. We would appreciate your feedback and comments so that we may continue to improve ourselves and the health of our patients.

## 2016-12-04 NOTE — Progress Notes (Signed)
PATIENT: Katherine Hayden DOB: 1965-08-12  REASON FOR VISIT: follow up HISTORY FROM: patient  HISTORY OF PRESENT ILLNESS: Today: 12/04/16 Katherine Hayden is a 51 year old female with a history of cervicogenic headaches and neck pain. She returns today for follow-up. At her point with Dr. Jannifer Franklin she was giving gabapentin and baclofen. She reports that baclofen has been beneficial however gabapentin was not. She states that she did eventually see Dr. Dossie Der he did injections in the neck. She states one week after the injection she developed pain in the left shoulder. She went to the emergency room where she was given a course of Flexeril, prednisone and Valium. She reports that this was beneficial for the shoulder discomfort however she continues to have neck pain as well as headaches. She states that her headaches usually originate from the back of the neck and radiate up the back of the head into the temporal region. She does have photophobia and phonophobia with her headaches as well as nausea. She reports that she recently lost her mother and one week later her sister overdosed on heroine. She states because of that she's been under more stress. she is uncertain if this is contributing to her symptoms. She returns today for an evaluation.  HISTORY 08/20/16: Katherine Hayden is a 51 year old right-handed white female with a history of headaches dating back several years. The patient has had chronic neck pain and she will get one to 2 headaches a week coming up from the back of the neck going into the head. The patient has had worsening of headaches over the last 3-4 months, she is now having daily headaches, the neck pain has increased, particularly on the left side. The patient will have pain into the left shoulder, she denies pain down the arms. The patient feels that the arms are heavy at times. She reports no numbness or weakness of the extremities. She does report some photophobia with the headache,  she denies phonophobia. She has nausea and very occasionally she will have vomiting. The patient indicates that when the neck is painful, the headaches are worse. The patient will get a bandlike sensation as if someone is squeezing her head around the back and front of the head. The patient has had chronic low back pain as well, she has had prior lumbosacral spine surgery on 4 occasions. She denies any problems controlling the bowels or the bladder, she denies any balance issues or falls. She takes Motrin and Goody powders for the headache, she has baclofen to take but she does not take this regularly. She is sent to this office for an evaluation. MRI of the brain done recently at [facility] shows mild to moderate white matter changes, no acute changes were seen. This study was done on 07/25/2016.   REVIEW OF SYSTEMS: Out of a complete 14 system review of symptoms, the patient complains only of the following symptoms, and all other reviewed systems are negative.  Hearing loss, ear pain, ringing in ears, restless leg, snoring, back pain, aching muscles, muscle cramps, neck pain, depression, headache, tremors  ALLERGIES: Allergies  Allergen Reactions  . Pneumococcal Vaccines Itching and Other (See Comments)    Patient received Influenza and Pneumococcal vaccines at the same time-caused severe itching and needs medication to reverse reaction, this caused her to go into cardiac arrest at that time.    Marland Kitchen Amoxicillin Hives  . Codeine Nausea And Vomiting  . Hydromorphone Hcl Nausea And Vomiting  . Sulfonamide Derivatives Hives  .  Hydrocodone-Acetaminophen Nausea Only    HOME MEDICATIONS: Outpatient Medications Prior to Visit  Medication Sig Dispense Refill  . ALPRAZolam (XANAX) 1 MG tablet Take 1 mg by mouth 4 (four) times daily.     . baclofen (LIORESAL) 10 MG tablet TAKE 1 TABLET BY MOUTH TWICE DAILY AS NEEDED FOR MUSCLE SPASMS  6  . dicyclomine (BENTYL) 20 MG tablet Take 20 mg by mouth 3  (three) times daily as needed.  2  . escitalopram (LEXAPRO) 20 MG tablet Take 20 mg by mouth daily.    Marland Kitchen esomeprazole (NEXIUM) 40 MG capsule Take 40 mg by mouth 2 (two) times daily as needed (takes 1 tablet daily, then 1 addt'l tablet if needed).     . gabapentin (NEURONTIN) 300 MG capsule One capsule twice a day for 2 weeks, then take one in the morning and 2 in the evening 90 capsule 3  . HYDROcodone-acetaminophen (NORCO) 5-325 MG per tablet Take 1-2 tablets by mouth every 6 (six) hours as needed for severe pain. 15 tablet 0  . levothyroxine (SYNTHROID, LEVOTHROID) 175 MCG tablet Take 175 mcg by mouth daily.  2  . lisinopril-hydrochlorothiazide (PRINZIDE,ZESTORETIC) 20-12.5 MG per tablet Take 1 tablet by mouth daily.    . montelukast (SINGULAIR) 10 MG tablet Take 10 mg by mouth daily.  3  . polyethylene glycol powder (GLYCOLAX/MIRALAX) powder Take 17 g by mouth daily as needed. constipation  0  . PROAIR HFA 108 (90 BASE) MCG/ACT inhaler Inhale 2 puffs into the lungs every 4 (four) hours as needed for wheezing or shortness of breath.     . QUEtiapine (SEROQUEL) 300 MG tablet Take 300 mg by mouth at bedtime.    Marland Kitchen rOPINIRole (REQUIP) 4 MG tablet Take 4 mg by mouth 2 (two) times daily.     . simvastatin (ZOCOR) 20 MG tablet Take 20 mg by mouth daily at 6 PM.    . valACYclovir (VALTREX) 1000 MG tablet TAKE 1 TABLET BY MOUTH DAILY as needed     No facility-administered medications prior to visit.     PAST MEDICAL HISTORY: Past Medical History:  Diagnosis Date  . Asthma   . Cervicogenic headache 08/20/2016  . Chronic headaches   . Depression   . Emphysema   . Emphysema of lung ([place])   . Hypertension   . Thyroid disease     PAST SURGICAL HISTORY: Past Surgical History:  Procedure Laterality Date  . APPENDECTOMY    . BACK SURGERY    . COLONOSCOPY W/ POLYPECTOMY  07/2013   1 polyp removal  . CYST REMOVAL HAND  2015    FAMILY HISTORY: Family History  Problem Relation Age of Onset  .  Hypertension Mother   . Hypertension Father   . Heart disease Maternal Grandfather   . Heart disease Paternal Grandmother     SOCIAL HISTORY: Social History   Social History  . Marital status: Married    Spouse name: N/A  . Number of children: N/A  . Years of education: N/A   Occupational History  . Not on file.   Social History Main Topics  . Smoking status: Current Every Day Smoker    Packs/day: 0.50    Types: Cigarettes  . Smokeless tobacco: Never Used     Comment: 5 to 6 per day  . Alcohol use Yes  . Drug use: No  . Sexual activity: Not on file   Other Topics Concern  . Not on file   Social History Narrative  .  No narrative on file      PHYSICAL EXAM  Vitals:   12/04/16 1432  BP: 127/68  Pulse: 84  Weight: 226 lb 3.2 oz (102.6 kg)  Height: 5\' 6"  (1.676 m)   Body mass index is 36.51 kg/m.  Generalized: Well developed, in no acute distress   Neurological examination  Mentation: Alert oriented to time, place, history taking. Follows all commands speech and language fluent Cranial nerve II-XII: Pupils were equal round reactive to light. Extraocular movements were full, visual field were full on confrontational test. Facial sensation and strength were normal. Uvula tongue midline. Head turning and shoulder shrug  were normal and symmetric. Motor: The motor testing reveals 5 over 5 strength of all 4 extremities. Good symmetric motor tone is noted throughout.  Sensory: Sensory testing is intact to soft touch on all 4 extremities. No evidence of extinction is noted.  Coordination: Cerebellar testing reveals good finger-nose-finger and heel-to-shin bilaterally.  Gait and station: Gait is normal. Tandem gait is Slightly unsteady.. Romberg is negative. No drift is seen.  Reflexes: Deep tendon reflexes are symmetric and normal bilaterally.   DIAGNOSTIC DATA (LABS, IMAGING, TESTING) - I reviewed patient records, labs, notes, testing and imaging myself where  available.  Lab Results  Component Value Date   WBC 11.1 (H) 03/04/2013   HGB 14.3 08/21/2014   HCT 42.0 08/21/2014   MCV 94.3 03/04/2013   PLT 253 03/04/2013      Component Value Date/Time   NA 140 08/21/2014 1300   K 4.1 08/21/2014 1300   CL 105 08/21/2014 1300   CO2 22 03/04/2013 1830   GLUCOSE 108 (H) 08/21/2014 1300   BUN 16 08/21/2014 1300   CREATININE 0.60 08/21/2014 1300   CALCIUM 9.2 03/04/2013 1830   PROT 7.1 03/01/2010 0000   ALBUMIN 4.3 03/01/2010 0000   AST 27 03/01/2010 0000   ALT 43 03/01/2010 0000   ALKPHOS 138 03/01/2010 0000   BILITOT 0.3 03/01/2010 0000   GFRNONAA >90 03/04/2013 1830   GFRAA >90 03/04/2013 1830      Lab Results  Component Value Date   TSH 1.23 09/14/2013      ASSESSMENT AND PLAN 51 y.o. year old female  has a past medical history of Asthma; Cervicogenic headache (08/20/2016); Chronic headaches; Depression; Emphysema; Emphysema of lung ([place]); Hypertension; and Thyroid disease. here with:  1. Neck pain 2. Cervicogenic headache  Patient is reporting that she was told she has a bulging disc from a scan that was completed at [facility]. I do not have this report or imaging. The patient would like me to review the imaging before initiating any new medication. She states that if there is a surgical option she would be amenable to this. she does not want to be on medication lifelong. I am amenable to this plan. I've instructed her to have the imaging sent to our office. Once we've reviewed we will discuss the next step in treatment with her. She voiced understanding. She will follow-up in 6 months or sooner if needed.     Ward Givens, MSN, NP-C 12/04/2016, 2:39 PM [facility] [address] [place], [place] [address] [phone]

## 2016-12-05 DIAGNOSIS — M542 Cervicalgia: Secondary | ICD-10-CM | POA: Diagnosis not present

## 2016-12-05 DIAGNOSIS — G894 Chronic pain syndrome: Secondary | ICD-10-CM | POA: Diagnosis not present

## 2016-12-05 DIAGNOSIS — M961 Postlaminectomy syndrome, not elsewhere classified: Secondary | ICD-10-CM | POA: Diagnosis not present

## 2016-12-11 DIAGNOSIS — Z1231 Encounter for screening mammogram for malignant neoplasm of breast: Secondary | ICD-10-CM | POA: Diagnosis not present

## 2016-12-14 ENCOUNTER — Telehealth: Payer: Self-pay | Admitting: Neurology

## 2016-12-14 NOTE — Telephone Encounter (Signed)
  I called patient. The MRI of the cervical spine does show a central disc bulge without spinal cord compression or nerve root compression.  The patient went on gabapentin but apparently did not believe that this was helpful, I would be willing to try Lyrica if she is amenable to this.  She is followed by Dr. Nelva Bush as well for her neck pain.  MRI cervical 09/20/16:  Mild cervical spondylosis most notable at C5-6 where a shallow broad-based central protrusion narrows but does not quite efface the ventral thecal sac.

## 2016-12-17 NOTE — Progress Notes (Signed)
I reviewed note and agree with plan.   Penni Bombard, MD 5/39/6728, 9:79 PM Certified in Neurology, Neurophysiology and Neuroimaging  [facility] [address], [place], [place] [address] [phone]

## 2017-01-08 DIAGNOSIS — Z124 Encounter for screening for malignant neoplasm of cervix: Secondary | ICD-10-CM | POA: Diagnosis not present

## 2017-01-08 DIAGNOSIS — Z0000 Encounter for general adult medical examination without abnormal findings: Secondary | ICD-10-CM | POA: Diagnosis not present

## 2017-01-08 DIAGNOSIS — Z1289 Encounter for screening for malignant neoplasm of other sites: Secondary | ICD-10-CM | POA: Diagnosis not present

## 2017-01-09 DIAGNOSIS — Z0000 Encounter for general adult medical examination without abnormal findings: Secondary | ICD-10-CM | POA: Diagnosis not present

## 2017-01-25 DIAGNOSIS — I10 Essential (primary) hypertension: Secondary | ICD-10-CM | POA: Diagnosis not present

## 2017-01-25 DIAGNOSIS — G259 Extrapyramidal and movement disorder, unspecified: Secondary | ICD-10-CM | POA: Diagnosis not present

## 2017-01-25 DIAGNOSIS — R739 Hyperglycemia, unspecified: Secondary | ICD-10-CM | POA: Diagnosis not present

## 2017-01-25 DIAGNOSIS — E039 Hypothyroidism, unspecified: Secondary | ICD-10-CM | POA: Diagnosis not present

## 2017-01-25 DIAGNOSIS — M545 Low back pain: Secondary | ICD-10-CM | POA: Diagnosis not present

## 2017-01-25 DIAGNOSIS — E782 Mixed hyperlipidemia: Secondary | ICD-10-CM | POA: Diagnosis not present

## 2017-02-04 DIAGNOSIS — M961 Postlaminectomy syndrome, not elsewhere classified: Secondary | ICD-10-CM | POA: Diagnosis not present

## 2017-02-04 DIAGNOSIS — M542 Cervicalgia: Secondary | ICD-10-CM | POA: Diagnosis not present

## 2017-02-04 DIAGNOSIS — G894 Chronic pain syndrome: Secondary | ICD-10-CM | POA: Diagnosis not present

## 2017-02-05 DIAGNOSIS — M720 Palmar fascial fibromatosis [Dupuytren]: Secondary | ICD-10-CM | POA: Diagnosis not present

## 2017-02-06 ENCOUNTER — Other Ambulatory Visit: Payer: Self-pay | Admitting: Orthopedic Surgery

## 2017-02-19 ENCOUNTER — Other Ambulatory Visit: Payer: Self-pay

## 2017-02-19 ENCOUNTER — Encounter (HOSPITAL_BASED_OUTPATIENT_CLINIC_OR_DEPARTMENT_OTHER): Payer: Self-pay | Admitting: *Deleted

## 2017-02-20 ENCOUNTER — Encounter (HOSPITAL_BASED_OUTPATIENT_CLINIC_OR_DEPARTMENT_OTHER)
Admission: RE | Admit: 2017-02-20 | Discharge: 2017-02-20 | Disposition: A | Discharge status: Home / Self Care | Payer: Medicare Other | Source: Ambulatory Visit | Attending: Orthopedic Surgery | Admitting: Orthopedic Surgery

## 2017-02-20 DIAGNOSIS — M720 Palmar fascial fibromatosis [Dupuytren]: Secondary | ICD-10-CM | POA: Insufficient documentation

## 2017-02-20 DIAGNOSIS — Z01810 Encounter for preprocedural cardiovascular examination: Secondary | ICD-10-CM | POA: Diagnosis not present

## 2017-02-20 DIAGNOSIS — R2232 Localized swelling, mass and lump, left upper limb: Secondary | ICD-10-CM | POA: Insufficient documentation

## 2017-02-20 DIAGNOSIS — Z01812 Encounter for preprocedural laboratory examination: Secondary | ICD-10-CM | POA: Insufficient documentation

## 2017-02-20 LAB — BASIC METABOLIC PANEL
Anion gap: 8 (ref 5–15)
BUN: 10 mg/dL (ref 6–20)
CO2: 26 mmol/L (ref 22–32)
Calcium: 9.1 mg/dL (ref 8.9–10.3)
Chloride: 103 mmol/L (ref 101–111)
Creatinine, Ser: 0.81 mg/dL (ref 0.44–1.00)
GFR calc Af Amer: >60 mL/min (ref >60)
GFR calc non Af Amer: >60 mL/min (ref >60)
Glucose, Bld: 95 mg/dL (ref 65–99)
Potassium: 3.5 mmol/L (ref 3.5–5.1)
Sodium: 137 mmol/L (ref 135–145)

## 2017-02-25 ENCOUNTER — Ambulatory Visit (HOSPITAL_BASED_OUTPATIENT_CLINIC_OR_DEPARTMENT_OTHER): Payer: Medicare Other | Admitting: Anesthesiology

## 2017-02-25 ENCOUNTER — Ambulatory Visit (HOSPITAL_BASED_OUTPATIENT_CLINIC_OR_DEPARTMENT_OTHER)
Admission: RE | Admit: 2017-02-25 | Discharge: 2017-02-25 | Disposition: A | Discharge status: Home / Self Care | Payer: Medicare Other | Source: Ambulatory Visit | Attending: Orthopedic Surgery | Admitting: Orthopedic Surgery

## 2017-02-25 ENCOUNTER — Encounter (HOSPITAL_BASED_OUTPATIENT_CLINIC_OR_DEPARTMENT_OTHER)
Admission: RE | Disposition: A | Discharge status: Home / Self Care | Payer: Self-pay | Source: Ambulatory Visit | Attending: Orthopedic Surgery

## 2017-02-25 ENCOUNTER — Other Ambulatory Visit: Payer: Self-pay

## 2017-02-25 ENCOUNTER — Encounter (HOSPITAL_BASED_OUTPATIENT_CLINIC_OR_DEPARTMENT_OTHER): Payer: Self-pay | Admitting: Anesthesiology

## 2017-02-25 DIAGNOSIS — E669 Obesity, unspecified: Secondary | ICD-10-CM | POA: Insufficient documentation

## 2017-02-25 DIAGNOSIS — J439 Emphysema, unspecified: Secondary | ICD-10-CM | POA: Insufficient documentation

## 2017-02-25 DIAGNOSIS — F329 Major depressive disorder, single episode, unspecified: Secondary | ICD-10-CM | POA: Insufficient documentation

## 2017-02-25 DIAGNOSIS — D1739 Benign lipomatous neoplasm of skin and subcutaneous tissue of other sites: Secondary | ICD-10-CM | POA: Diagnosis not present

## 2017-02-25 DIAGNOSIS — R2232 Localized swelling, mass and lump, left upper limb: Secondary | ICD-10-CM | POA: Diagnosis present

## 2017-02-25 DIAGNOSIS — Z79899 Other long term (current) drug therapy: Secondary | ICD-10-CM | POA: Insufficient documentation

## 2017-02-25 DIAGNOSIS — E039 Hypothyroidism, unspecified: Secondary | ICD-10-CM | POA: Insufficient documentation

## 2017-02-25 DIAGNOSIS — D481 Neoplasm of uncertain behavior of connective and other soft tissue: Secondary | ICD-10-CM | POA: Diagnosis not present

## 2017-02-25 DIAGNOSIS — M720 Palmar fascial fibromatosis [Dupuytren]: Secondary | ICD-10-CM | POA: Insufficient documentation

## 2017-02-25 DIAGNOSIS — J45909 Unspecified asthma, uncomplicated: Secondary | ICD-10-CM | POA: Insufficient documentation

## 2017-02-25 DIAGNOSIS — F17210 Nicotine dependence, cigarettes, uncomplicated: Secondary | ICD-10-CM | POA: Diagnosis not present

## 2017-02-25 DIAGNOSIS — F419 Anxiety disorder, unspecified: Secondary | ICD-10-CM | POA: Diagnosis not present

## 2017-02-25 DIAGNOSIS — K219 Gastro-esophageal reflux disease without esophagitis: Secondary | ICD-10-CM | POA: Insufficient documentation

## 2017-02-25 DIAGNOSIS — Z6836 Body mass index (BMI) 36.0-36.9, adult: Secondary | ICD-10-CM | POA: Insufficient documentation

## 2017-02-25 DIAGNOSIS — Z79890 Hormone replacement therapy (postmenopausal): Secondary | ICD-10-CM | POA: Diagnosis not present

## 2017-02-25 DIAGNOSIS — I10 Essential (primary) hypertension: Secondary | ICD-10-CM | POA: Diagnosis not present

## 2017-02-25 HISTORY — PX: EXCISION MASS UPPER EXTREMETIES: SHX6704

## 2017-02-25 HISTORY — DX: Hypothyroidism, unspecified: E03.9

## 2017-02-25 HISTORY — DX: Other chronic pain: G89.29

## 2017-02-25 HISTORY — DX: Anxiety disorder, unspecified: F41.9

## 2017-02-25 HISTORY — DX: Gastro-esophageal reflux disease without esophagitis: K21.9

## 2017-02-25 SURGERY — EXCISION MASS UPPER EXTREMITIES
Anesthesia: Monitor Anesthesia Care | Site: Hand | Laterality: Left

## 2017-02-25 MED ORDER — PROMETHAZINE HCL 25 MG/ML IJ SOLN: 6.2500 mg | INTRAMUSCULAR | Status: DC | PRN

## 2017-02-25 MED ORDER — ONDANSETRON HCL 4 MG/2ML IJ SOLN
INTRAMUSCULAR | Status: DC | PRN
  Administered 2017-02-25: 4 mg via INTRAVENOUS

## 2017-02-25 MED ORDER — MIDAZOLAM HCL 2 MG/2ML IJ SOLN
1.0000 mg | INTRAMUSCULAR | Status: DC | PRN
  Administered 2017-02-25: 2 mg via INTRAVENOUS

## 2017-02-25 MED ORDER — FENTANYL CITRATE (PF) 100 MCG/2ML IJ SOLN
INTRAMUSCULAR | Status: AC
  Filled 2017-02-25: qty 2

## 2017-02-25 MED ORDER — VANCOMYCIN HCL IN DEXTROSE 1-5 GM/200ML-% IV SOLN
INTRAVENOUS | Status: AC
  Filled 2017-02-25: qty 200

## 2017-02-25 MED ORDER — SCOPOLAMINE 1 MG/3DAYS TD PT72: 1.0000 | MEDICATED_PATCH | Freq: Once | TRANSDERMAL | Status: DC | PRN

## 2017-02-25 MED ORDER — LIDOCAINE 2% (20 MG/ML) 5 ML SYRINGE
INTRAMUSCULAR | Status: AC
  Filled 2017-02-25: qty 5

## 2017-02-25 MED ORDER — LACTATED RINGERS IV SOLN
INTRAVENOUS | Status: DC
  Administered 2017-02-25: 12:00:00 via INTRAVENOUS

## 2017-02-25 MED ORDER — BUPIVACAINE HCL (PF) 0.25 % IJ SOLN
INTRAMUSCULAR | Status: DC | PRN
  Administered 2017-02-25: 6 mL

## 2017-02-25 MED ORDER — VANCOMYCIN HCL IN DEXTROSE 1-5 GM/200ML-% IV SOLN
1000.0000 mg | INTRAVENOUS | Status: AC
  Administered 2017-02-25: 1000 mg via INTRAVENOUS

## 2017-02-25 MED ORDER — FENTANYL CITRATE (PF) 100 MCG/2ML IJ SOLN
50.0000 ug | INTRAMUSCULAR | Status: DC | PRN
  Administered 2017-02-25: 100 ug via INTRAVENOUS

## 2017-02-25 MED ORDER — HYDROCODONE-ACETAMINOPHEN 5-325 MG PO TABS: ORAL_TABLET | ORAL | 0 refills | Status: DC

## 2017-02-25 MED ORDER — PROPOFOL 10 MG/ML IV BOLUS
INTRAVENOUS | Status: AC
  Filled 2017-02-25: qty 20

## 2017-02-25 MED ORDER — MIDAZOLAM HCL 2 MG/2ML IJ SOLN
INTRAMUSCULAR | Status: AC
  Filled 2017-02-25: qty 2

## 2017-02-25 MED ORDER — FENTANYL CITRATE (PF) 100 MCG/2ML IJ SOLN
25.0000 ug | INTRAMUSCULAR | Status: DC | PRN
  Administered 2017-02-25 (×3): 25 ug via INTRAVENOUS

## 2017-02-25 MED ORDER — BUPIVACAINE HCL (PF) 0.25 % IJ SOLN
INTRAMUSCULAR | Status: AC
  Filled 2017-02-25: qty 30

## 2017-02-25 MED ORDER — CHLORHEXIDINE GLUCONATE 4 % EX LIQD: 60.0000 mL | Freq: Once | CUTANEOUS | Status: DC

## 2017-02-25 MED ORDER — PROPOFOL 10 MG/ML IV BOLUS
INTRAVENOUS | Status: DC | PRN
  Administered 2017-02-25: 30 mg via INTRAVENOUS

## 2017-02-25 MED ORDER — PROPOFOL 500 MG/50ML IV EMUL
INTRAVENOUS | Status: DC | PRN
  Administered 2017-02-25: 75 ug/kg/min via INTRAVENOUS

## 2017-02-25 SURGICAL SUPPLY — 55 items
APL SKNCLS STERI-STRIP NONHPOA (GAUZE/BANDAGES/DRESSINGS)
BANDAGE ACE 3X5.8 VEL STRL LF (GAUZE/BANDAGES/DRESSINGS) IMPLANT
BANDAGE COBAN STERILE 2 (GAUZE/BANDAGES/DRESSINGS) IMPLANT
BENZOIN TINCTURE PRP APPL 2/3 (GAUZE/BANDAGES/DRESSINGS) IMPLANT
BLADE MINI RND TIP GREEN BEAV (BLADE) IMPLANT
BLADE SURG 15 STRL LF DISP TIS (BLADE) ×2 IMPLANT
BLADE SURG 15 STRL SS (BLADE) ×4
BNDG CMPR 9X4 STRL LF SNTH (GAUZE/BANDAGES/DRESSINGS)
BNDG COHESIVE 1X5 TAN STRL LF (GAUZE/BANDAGES/DRESSINGS) IMPLANT
BNDG CONFORM 2 STRL LF (GAUZE/BANDAGES/DRESSINGS) IMPLANT
BNDG ELASTIC 2X5.8 VLCR STR LF (GAUZE/BANDAGES/DRESSINGS) IMPLANT
BNDG ESMARK 4X9 LF (GAUZE/BANDAGES/DRESSINGS) IMPLANT
BNDG GAUZE 1X2.1 STRL (MISCELLANEOUS) IMPLANT
BNDG GAUZE ELAST 4 BULKY (GAUZE/BANDAGES/DRESSINGS) IMPLANT
BNDG PLASTER X FAST 3X3 WHT LF (CAST SUPPLIES) IMPLANT
BNDG PLSTR 9X3 FST ST WHT (CAST SUPPLIES)
CHLORAPREP W/TINT 26ML (MISCELLANEOUS) ×2 IMPLANT
CORD BIPOLAR FORCEPS 12FT (ELECTRODE) ×2 IMPLANT
COVER BACK TABLE 60X90IN (DRAPES) ×2 IMPLANT
COVER MAYO STAND STRL (DRAPES) ×2 IMPLANT
CUFF TOURNIQUET SINGLE 18IN (TOURNIQUET CUFF) ×2 IMPLANT
DRAPE EXTREMITY T 121X128X90 (DRAPE) ×2 IMPLANT
DRAPE SURG 17X23 STRL (DRAPES) ×2 IMPLANT
GAUZE SPONGE 4X4 12PLY STRL (GAUZE/BANDAGES/DRESSINGS) ×2 IMPLANT
GAUZE XEROFORM 1X8 LF (GAUZE/BANDAGES/DRESSINGS) ×2 IMPLANT
GLOVE BIO SURGEON STRL SZ 6.5 (GLOVE) ×1 IMPLANT
GLOVE BIO SURGEON STRL SZ7.5 (GLOVE) ×2 IMPLANT
GLOVE BIOGEL PI IND STRL 7.0 (GLOVE) IMPLANT
GLOVE BIOGEL PI IND STRL 8 (GLOVE) ×1 IMPLANT
GLOVE BIOGEL PI INDICATOR 7.0 (GLOVE) ×2
GLOVE BIOGEL PI INDICATOR 8 (GLOVE) ×1
GOWN STRL REUS W/ TWL LRG LVL3 (GOWN DISPOSABLE) ×1 IMPLANT
GOWN STRL REUS W/TWL LRG LVL3 (GOWN DISPOSABLE) ×2
GOWN STRL REUS W/TWL XL LVL3 (GOWN DISPOSABLE) ×2 IMPLANT
NDL HYPO 25X1 1.5 SAFETY (NEEDLE) ×1 IMPLANT
NEEDLE HYPO 25X1 1.5 SAFETY (NEEDLE) ×2 IMPLANT
NS IRRIG 1000ML POUR BTL (IV SOLUTION) ×2 IMPLANT
PACK BASIN DAY SURGERY FS (CUSTOM PROCEDURE TRAY) ×2 IMPLANT
PAD CAST 3X4 CTTN HI CHSV (CAST SUPPLIES) IMPLANT
PAD CAST 4YDX4 CTTN HI CHSV (CAST SUPPLIES) IMPLANT
PADDING CAST ABS 4INX4YD NS (CAST SUPPLIES) ×1
PADDING CAST ABS COTTON 4X4 ST (CAST SUPPLIES) ×1 IMPLANT
PADDING CAST COTTON 3X4 STRL (CAST SUPPLIES)
PADDING CAST COTTON 4X4 STRL (CAST SUPPLIES)
STOCKINETTE 4X48 STRL (DRAPES) ×2 IMPLANT
STRIP CLOSURE SKIN 1/2X4 (GAUZE/BANDAGES/DRESSINGS) IMPLANT
SUT ETHILON 3 0 PS 1 (SUTURE) IMPLANT
SUT ETHILON 4 0 PS 2 18 (SUTURE) ×2 IMPLANT
SUT ETHILON 5 0 P 3 18 (SUTURE)
SUT NYLON ETHILON 5-0 P-3 1X18 (SUTURE) IMPLANT
SUT VIC AB 4-0 P2 18 (SUTURE) IMPLANT
SYR BULB 3OZ (MISCELLANEOUS) ×2 IMPLANT
SYR CONTROL 10ML LL (SYRINGE) ×2 IMPLANT
TOWEL OR 17X24 6PK STRL BLUE (TOWEL DISPOSABLE) ×4 IMPLANT
UNDERPAD 30X30 (UNDERPADS AND DIAPERS) ×2 IMPLANT

## 2017-02-25 NOTE — Op Note (Signed)
NAMEMarland Kitchen  Katherine, Hayden NO.:  [id number]  MEDICAL RECORD NO.:  [id number]  LOCATION:                                 FACILITY:  PHYSICIAN:  Leanora Cover, MD             DATE OF BIRTH:  DATE OF PROCEDURE:  02/25/2017 DATE OF DISCHARGE:                              OPERATIVE REPORT   PREOPERATIVE DIAGNOSIS:  Left palm mass.  POSTOPERATIVE DIAGNOSIS:  Left palm fibroma.  PROCEDURE:  Excision of mass, left palm, approximately 7 mm in diameter.  SURGEON:  Leanora Cover, MD.  ASSISTANT:  None.  ANESTHESIA:  Bier block with sedation.  IV FLUIDS:  Per anesthesia flow sheet.  ESTIMATED BLOOD LOSS:  Minimal.  COMPLICATIONS:  None.  SPECIMENS:  Left palm mass to Pathology.  TOURNIQUET TIME:  23 minutes.  DISPOSITION:  Stable to PACU.  INDICATIONS:  Katherine Hayden is a 51 year old female, who has noted a mass in the left palm.  It is bothersome to her.  She wished to have it excised.  Risks, benefits, and alternatives of surgery were discussed including the risks of blood loss, infection, damage to nerves, vessels, tendons, ligaments, and bone, failure of surgery, need for additional surgery complications with wound healing, continued pain, and recurrence of mass.  She voiced understanding of these risks and elected to proceed.  OPERATIVE COURSE:  After being identified preoperatively by myself, the patient and I agreed upon procedure and site of procedure.  Surgical site was marked.  The risks, benefits, and alternatives of surgery were reviewed and she wished to proceed.  Surgical consent had been signed. She was given IV antibiotics as preoperative antibiotic prophylaxis. She was transferred to the operating room and placed on the operating room table in supine position with the left upper extremity on an arm board.  Bier block anesthesia was induced by anesthesiologist.  The left upper extremity was prepped and draped in a normal sterile orthopedic fashion.   Surgical pause was performed between surgeons, Anesthesia, and operating room staff; and all were in agreement as to the patient, procedure, and site of procedure.  Tourniquet at the proximal aspect of the forearm had been inflated for the Bier block.  Incision was made over the mass and carried in a Brunner fashion.  It was carried into subcutaneous tissues by spreading technique.  Bipolar electrocautery was used to obtain hemostasis.  The mass was identified.  It was involving the palmar fascia.  It was excised in its entirety.  It measured 5-7 mm in diameter.  The ulnar digital nerve to the small finger and the common digital nerve to the ring and small finger were identified and were intact.  The skin was placed back over top of the wound and the area palpated.  No mass was able to be palpated.  The mass was sent to Pathology for examination.  The wound was copiously irrigated with sterile saline and closed with 4-0 nylon in a horizontal mattress fashion.  It was injected with 5 mL of 0.25% plain Marcaine to aid in postoperative analgesia.  It was then dressed with sterile Xeroform, 4 x 4's and wrapped with a  Coban dressing lightly.  Tourniquet was deflated at 23 minutes.  Fingertips were pink with brisk capillary refill after deflation of tourniquet.  Operative drapes were broken down and the patient was awakened from anesthesia safely.  She was transferred back to stretcher and taken to PACU in stable condition.  I will see her back in the office in 1 week for postoperative followup.  I will give her Norco 5/325 one to two p.o. q.6 hours p.r.n. pain, dispensed #20.     Leanora Cover, MD     KK/MEDQ  D:  02/25/2017  T:  02/25/2017  Job:  [id number]

## 2017-02-25 NOTE — Anesthesia Preprocedure Evaluation (Addendum)
Anesthesia Evaluation  Patient identified by MRN, date of birth, ID band Patient awake    Reviewed: Allergy & Precautions, NPO status , Patient's Chart, lab work & pertinent test results  Airway Mallampati: II  TM Distance: >3 FB Neck ROM: Full    Dental  (+) Teeth Intact, Dental Advisory Given, Missing,    Pulmonary asthma , COPD,  COPD inhaler, Current Smoker,    Pulmonary exam normal breath sounds clear to auscultation       Cardiovascular hypertension, Pt. on medications (-) angina(-) CAD, (-) Past MI and (-) CHF Normal cardiovascular exam Rhythm:Regular Rate:Normal     Neuro/Psych  Headaches, PSYCHIATRIC DISORDERS Anxiety Depression    GI/Hepatic Neg liver ROS, GERD  Medicated,  Endo/Other  Hypothyroidism Obesity   Renal/GU negative Renal ROS     Musculoskeletal  (+) Arthritis ,   Abdominal   Peds  Hematology negative hematology ROS (+)   Anesthesia Other Findings Day of surgery medications reviewed with the patient.  Reproductive/Obstetrics                            Anesthesia Physical Anesthesia Plan  ASA: III  Anesthesia Plan: Bier Block and MAC and Bier Block-LIDOCAINE ONLY   Post-op Pain Management:  Regional for Post-op pain   Induction: Intravenous  PONV Risk Score and Plan: 3 and Ondansetron, Dexamethasone and Midazolam  Airway Management Planned: Simple Face Mask  Additional Equipment:   Intra-op Plan:   Post-operative Plan:   Informed Consent: I have reviewed the patients History and Physical, chart, labs and discussed the procedure including the risks, benefits and alternatives for the proposed anesthesia with the patient or authorized representative who has indicated his/her understanding and acceptance.   Dental advisory given  Plan Discussed with: CRNA  Anesthesia Plan Comments: (Risks/benefits of regional block discussed with patient including risk of  bleeding, infection, nerve damage, and possibility of failed block.  Also discussed backup plan of general anesthesia and associated risks.  Patient wishes to proceed.)       Anesthesia Quick Evaluation

## 2017-02-25 NOTE — Brief Op Note (Signed)
02/25/2017  12:33 PM  PATIENT:  Katherine Hayden  51 y.o. female  PRE-OPERATIVE DIAGNOSIS:  LEFT PALM MASS  POST-OPERATIVE DIAGNOSIS:  LEFT PALM MASS  PROCEDURE:  Procedure(s): EXCISION MASS UPPER EXTREMETIES, LEFT PALM (Left)  SURGEON:  Surgeon(s) and Role:    Leanora Cover, MD - Primary  PHYSICIAN ASSISTANT:   ASSISTANTS: none   ANESTHESIA:   Bier block with sedation  EBL:  Minimal   BLOOD ADMINISTERED:none  DRAINS: none   LOCAL MEDICATIONS USED:  MARCAINE     SPECIMEN:  Source of Specimen:  left palm  DISPOSITION OF SPECIMEN:  PATHOLOGY  COUNTS:  YES  TOURNIQUET:   Total Tourniquet Time Documented: Forearm (Left) - 23 minutes Total: Forearm (Left) - 23 minutes   DICTATION: .Other Dictation: Dictation Number [id number]  PLAN OF CARE: Discharge to home after PACU  PATIENT DISPOSITION:  PACU - hemodynamically stable.

## 2017-02-25 NOTE — H&P (Signed)
Katherine Hayden is an 51 y.o. female.   Chief Complaint: left palm mass HPI: 51 yo female with mass in left palm.  It is bothersome and painful to her.  She wishes to have it excised.   Allergies:  Allergies  Allergen Reactions  . Pneumococcal Vaccines Itching and Other (See Comments)    Patient received Influenza and Pneumococcal vaccines at the same time-caused severe itching and needs medication to reverse reaction, this caused her to go into cardiac arrest at that time.    Marland Kitchen Amoxicillin Hives  . Codeine Nausea And Vomiting  . Hydromorphone Hcl Nausea And Vomiting  . Sulfonamide Derivatives Hives  . Morphine And Related Hives    Past Medical History:  Diagnosis Date  . Anxiety   . Asthma   . Cervicogenic headache 08/20/2016  . Chronic headaches   . Chronic pain   . Depression   . Emphysema   . Emphysema of lung ([place])   . GERD (gastroesophageal reflux disease)   . Hypertension   . Hypothyroidism   . Thyroid disease     Past Surgical History:  Procedure Laterality Date  . APPENDECTOMY    . BACK SURGERY     x4  . COLONOSCOPY W/ POLYPECTOMY  07/2013   1 polyp removal  . CYST REMOVAL HAND  2015    Family History: Family History  Problem Relation Age of Onset  . Hypertension Mother   . Hypertension Father   . Heart disease Maternal Grandfather   . Heart disease Paternal Grandmother     Social History:   reports that she has been smoking cigarettes.  She has been smoking about 0.50 packs per day. she has never used smokeless tobacco. She reports that she drinks alcohol. She reports that she does not use drugs.  Medications: Medications Prior to Admission  Medication Sig Dispense Refill  . ALPRAZolam (XANAX) 1 MG tablet Take 1 mg by mouth 4 (four) times daily.     . baclofen (LIORESAL) 10 MG tablet TAKE 1 TABLET BY MOUTH TWICE DAILY AS NEEDED FOR MUSCLE SPASMS  6  . dicyclomine (BENTYL) 20 MG tablet Take 20 mg by mouth 3 (three) times daily as needed.  2   . docusate sodium (COLACE) 100 MG capsule Take 100 mg 2 (two) times daily by mouth.    . escitalopram (LEXAPRO) 20 MG tablet Take 20 mg by mouth daily.    Marland Kitchen esomeprazole (NEXIUM) 40 MG capsule Take 40 mg by mouth 2 (two) times daily as needed (takes 1 tablet daily, then 1 addt'l tablet if needed).     Marland Kitchen HYDROcodone-acetaminophen (NORCO) 5-325 MG per tablet Take 1-2 tablets by mouth every 6 (six) hours as needed for severe pain. 15 tablet 0  . levothyroxine (SYNTHROID, LEVOTHROID) 175 MCG tablet Take 175 mcg by mouth daily.  2  . lisinopril-hydrochlorothiazide (PRINZIDE,ZESTORETIC) 20-12.5 MG per tablet Take 1 tablet by mouth daily.    . montelukast (SINGULAIR) 10 MG tablet Take 10 mg by mouth daily.  3  . PROAIR HFA 108 (90 BASE) MCG/ACT inhaler Inhale 2 puffs into the lungs every 4 (four) hours as needed for wheezing or shortness of breath.     . QUEtiapine (SEROQUEL) 300 MG tablet Take 300 mg by mouth at bedtime.    Marland Kitchen rOPINIRole (REQUIP) 4 MG tablet Take 4 mg by mouth 2 (two) times daily.     Marland Kitchen senna (SENOKOT) 8.6 MG TABS tablet Take 1 tablet by mouth.    Marland Kitchen  simvastatin (ZOCOR) 20 MG tablet Take 20 mg by mouth daily at 6 PM.    . valACYclovir (VALTREX) 1000 MG tablet TAKE 1 TABLET BY MOUTH DAILY as needed      No results found for this or any previous visit (from the past 48 hour(s)).  No results found.   A comprehensive review of systems was negative.  Blood pressure (!) 159/95, pulse 71, temperature 98.5 F (36.9 C), temperature source Oral, resp. rate 18, height 5\' 6"  (1.676 m), weight 100.3 kg (221 lb 2 oz), SpO2 98 %.  General appearance: alert, cooperative and appears stated age Head: Normocephalic, without obvious abnormality, atraumatic Neck: supple, symmetrical, trachea midline Resp: clear to auscultation bilaterally Cardio: regular rate and rhythm GI: non-tender Extremities: Intact sensation and capillary refill all digits.  +epl/fpl/io.  No wounds.  Pulses: 2+ and  symmetric Skin: Skin color, texture, turgor normal. No rashes or lesions Neurologic: Grossly normal Incision/Wound:none  Assessment/Plan Left palm mass.  Non operative and operative treatment options were discussed with the patient and patient wishes to proceed with operative treatment. Risks, benefits, and alternatives of surgery were discussed and the patient agrees with the plan of care.   Katherine Hayden R 02/25/2017, 11:32 AM

## 2017-02-25 NOTE — Op Note (Signed)
[id number] 

## 2017-02-25 NOTE — Anesthesia Procedure Notes (Signed)
Anesthesia Regional Block: Bier block (IV Regional)   Pre-Anesthetic Checklist: ,, timeout performed, Correct Patient, Correct Site, Correct Laterality, Correct Procedure,, site marked, surgical consent,, at surgeon's request Needles:  Injection technique: Single-shot  Needle Type: Other      Needle Gauge: 22     Additional Needles:   Procedures:,,,,, intact distal pulses, Esmarch exsanguination, single tourniquet utilized,  Narrative:   Performed by: Personally       

## 2017-02-25 NOTE — Discharge Instructions (Addendum)

## 2017-02-25 NOTE — Anesthesia Postprocedure Evaluation (Signed)
Anesthesia Post Note  Patient: Wrigley Winborne  Procedure(s) Performed: EXCISION MASS UPPER EXTREMETIES, LEFT PALM (Left Hand)     Patient location during evaluation: PACU Anesthesia Type: MAC Level of consciousness: awake and alert and oriented Pain management: pain level controlled Vital Signs Assessment: post-procedure vital signs reviewed and stable Respiratory status: spontaneous breathing, nonlabored ventilation and respiratory function stable Cardiovascular status: stable and blood pressure returned to baseline Postop Assessment: no apparent nausea or vomiting Anesthetic complications: no    Last Vitals:  Vitals:   02/25/17 1334 02/25/17 1342  BP: (!) 143/78   Pulse: 71   Resp: 14   Temp:  (!) 36.3 C  SpO2: 98%     Last Pain:  Vitals:   02/25/17 1342  TempSrc: Axillary  PainSc:                  Lizeth Bencosme A.

## 2017-02-25 NOTE — Transfer of Care (Signed)
Immediate Anesthesia Transfer of Care Note  Patient: Katherine Hayden  Procedure(s) Performed: EXCISION MASS UPPER EXTREMETIES, LEFT PALM (Left Hand)  Patient Location: PACU  Anesthesia Type:Bier block  Level of Consciousness: awake, alert , oriented and patient cooperative  Airway & Oxygen Therapy: Patient Spontanous Breathing and Patient connected to face mask oxygen  Post-op Assessment: Report given to RN and Post -op Vital signs reviewed and stable  Post vital signs: Reviewed and stable  Last Vitals:  Vitals:   02/25/17 1129  BP: (!) 159/95  Pulse: 71  Resp: 18  Temp: 36.9 C  SpO2: 98%    Last Pain:  Vitals:   02/25/17 1129  TempSrc: Oral  PainSc: 6       Patients Stated Pain Goal: 4 (12/30/28 0762)  Complications: No apparent anesthesia complications

## 2017-02-26 ENCOUNTER — Encounter (HOSPITAL_BASED_OUTPATIENT_CLINIC_OR_DEPARTMENT_OTHER): Payer: Self-pay | Admitting: Orthopedic Surgery

## 2017-03-27 DIAGNOSIS — M79645 Pain in left finger(s): Secondary | ICD-10-CM | POA: Diagnosis not present

## 2017-04-14 ENCOUNTER — Encounter (HOSPITAL_COMMUNITY): Payer: Self-pay | Admitting: Emergency Medicine

## 2017-04-14 ENCOUNTER — Emergency Department (HOSPITAL_COMMUNITY)
Admission: EM | Admit: 2017-04-14 | Discharge: 2017-04-14 | Disposition: A | Discharge status: Home / Self Care | Payer: Medicare Other | Attending: Emergency Medicine | Admitting: Emergency Medicine

## 2017-04-14 ENCOUNTER — Other Ambulatory Visit: Payer: Self-pay

## 2017-04-14 DIAGNOSIS — M1990 Unspecified osteoarthritis, unspecified site: Secondary | ICD-10-CM | POA: Diagnosis not present

## 2017-04-14 DIAGNOSIS — Z5321 Procedure and treatment not carried out due to patient leaving prior to being seen by health care provider: Secondary | ICD-10-CM | POA: Insufficient documentation

## 2017-04-14 DIAGNOSIS — R1011 Right upper quadrant pain: Secondary | ICD-10-CM | POA: Insufficient documentation

## 2017-04-14 DIAGNOSIS — Z79899 Other long term (current) drug therapy: Secondary | ICD-10-CM | POA: Diagnosis not present

## 2017-04-14 DIAGNOSIS — R51 Headache: Secondary | ICD-10-CM | POA: Diagnosis not present

## 2017-04-14 DIAGNOSIS — I10 Essential (primary) hypertension: Secondary | ICD-10-CM | POA: Diagnosis not present

## 2017-04-14 DIAGNOSIS — R5381 Other malaise: Secondary | ICD-10-CM | POA: Diagnosis not present

## 2017-04-14 DIAGNOSIS — K589 Irritable bowel syndrome without diarrhea: Secondary | ICD-10-CM | POA: Diagnosis not present

## 2017-04-14 DIAGNOSIS — R109 Unspecified abdominal pain: Secondary | ICD-10-CM | POA: Diagnosis not present

## 2017-04-14 DIAGNOSIS — R110 Nausea: Secondary | ICD-10-CM | POA: Diagnosis not present

## 2017-04-14 DIAGNOSIS — R1084 Generalized abdominal pain: Secondary | ICD-10-CM | POA: Diagnosis not present

## 2017-04-14 DIAGNOSIS — K219 Gastro-esophageal reflux disease without esophagitis: Secondary | ICD-10-CM | POA: Diagnosis not present

## 2017-04-14 DIAGNOSIS — R197 Diarrhea, unspecified: Secondary | ICD-10-CM | POA: Diagnosis not present

## 2017-04-14 LAB — COMPREHENSIVE METABOLIC PANEL
ALT: 39 U/L (ref 14–54)
AST: 29 U/L (ref 15–41)
Albumin: 4 g/dL (ref 3.5–5.0)
Alkaline Phosphatase: 128 U/L — ABNORMAL HIGH (ref 38–126)
Anion gap: 12 (ref 5–15)
BUN: 8 mg/dL (ref 6–20)
CO2: 23 mmol/L (ref 22–32)
Calcium: 9.1 mg/dL (ref 8.9–10.3)
Chloride: 107 mmol/L (ref 101–111)
Creatinine, Ser: 0.67 mg/dL (ref 0.44–1.00)
GFR calc Af Amer: >60 mL/min (ref >60)
GFR calc non Af Amer: >60 mL/min (ref >60)
Glucose, Bld: 92 mg/dL (ref 65–99)
Potassium: 3.8 mmol/L (ref 3.5–5.1)
Sodium: 142 mmol/L (ref 135–145)
Total Bilirubin: 0.5 mg/dL (ref 0.3–1.2)
Total Protein: 7.5 g/dL (ref 6.5–8.1)

## 2017-04-14 LAB — CBC WITH DIFFERENTIAL/PLATELET
Basophils Absolute: 0.1 10*3/uL (ref 0.0–0.1)
Basophils Relative: 1 %
Eosinophils Absolute: 0.2 10*3/uL (ref 0.0–0.7)
Eosinophils Relative: 2 %
HCT: 42.2 % (ref 36.0–46.0)
Hemoglobin: 13.3 g/dL (ref 12.0–15.0)
Lymphocytes Relative: 33 %
Lymphs Abs: 3.6 10*3/uL (ref 0.7–4.0)
MCH: 29.4 pg (ref 26.0–34.0)
MCHC: 31.5 g/dL (ref 30.0–36.0)
MCV: 93.2 fL (ref 78.0–100.0)
Monocytes Absolute: 0.8 10*3/uL (ref 0.1–1.0)
Monocytes Relative: 8 %
Neutro Abs: 6.2 10*3/uL (ref 1.7–7.7)
Neutrophils Relative %: 56 %
Platelets: 286 10*3/uL (ref 150–400)
RBC: 4.53 MIL/uL (ref 3.87–5.11)
RDW: 13.4 % (ref 11.5–15.5)
WBC: 10.9 10*3/uL — ABNORMAL HIGH (ref 4.0–10.5)

## 2017-04-14 LAB — LIPASE, BLOOD: Lipase: 29 U/L (ref 11–51)

## 2017-04-14 NOTE — ED Notes (Signed)
Pt not in waiting area at this time x 2

## 2017-04-14 NOTE — ED Notes (Signed)
Called x 1 not in waiting area

## 2017-04-14 NOTE — ED Notes (Signed)
Pt not in the waiting area x 3

## 2017-04-14 NOTE — ED Triage Notes (Signed)
Patient c/o right upper abd pain with nausea, vomiting, and diarrhea. Denies any fevers or urinary symptoms. Patient states diarrhea for "months" but abd pain started today.

## 2017-04-15 DIAGNOSIS — R197 Diarrhea, unspecified: Secondary | ICD-10-CM | POA: Diagnosis not present

## 2017-04-15 DIAGNOSIS — R109 Unspecified abdominal pain: Secondary | ICD-10-CM | POA: Diagnosis not present

## 2017-04-22 DIAGNOSIS — K635 Polyp of colon: Secondary | ICD-10-CM | POA: Diagnosis not present

## 2017-04-22 DIAGNOSIS — Z1211 Encounter for screening for malignant neoplasm of colon: Secondary | ICD-10-CM | POA: Diagnosis not present

## 2017-04-24 DIAGNOSIS — E1165 Type 2 diabetes mellitus with hyperglycemia: Secondary | ICD-10-CM | POA: Diagnosis not present

## 2017-04-24 DIAGNOSIS — G259 Extrapyramidal and movement disorder, unspecified: Secondary | ICD-10-CM | POA: Diagnosis not present

## 2017-04-24 DIAGNOSIS — M545 Low back pain: Secondary | ICD-10-CM | POA: Diagnosis not present

## 2017-04-24 DIAGNOSIS — E782 Mixed hyperlipidemia: Secondary | ICD-10-CM | POA: Diagnosis not present

## 2017-04-24 DIAGNOSIS — E039 Hypothyroidism, unspecified: Secondary | ICD-10-CM | POA: Diagnosis not present

## 2017-04-24 DIAGNOSIS — Z0000 Encounter for general adult medical examination without abnormal findings: Secondary | ICD-10-CM | POA: Diagnosis not present

## 2017-04-25 DIAGNOSIS — M549 Dorsalgia, unspecified: Secondary | ICD-10-CM | POA: Diagnosis not present

## 2017-04-25 DIAGNOSIS — G8929 Other chronic pain: Secondary | ICD-10-CM | POA: Diagnosis not present

## 2017-04-25 DIAGNOSIS — E039 Hypothyroidism, unspecified: Secondary | ICD-10-CM | POA: Diagnosis not present

## 2017-04-25 DIAGNOSIS — Z86010 Personal history of colonic polyps: Secondary | ICD-10-CM | POA: Diagnosis not present

## 2017-04-25 DIAGNOSIS — R194 Change in bowel habit: Secondary | ICD-10-CM | POA: Diagnosis not present

## 2017-04-25 DIAGNOSIS — Z888 Allergy status to other drugs, medicaments and biological substances status: Secondary | ICD-10-CM | POA: Diagnosis not present

## 2017-04-25 DIAGNOSIS — D126 Benign neoplasm of colon, unspecified: Secondary | ICD-10-CM | POA: Diagnosis not present

## 2017-04-25 DIAGNOSIS — I10 Essential (primary) hypertension: Secondary | ICD-10-CM | POA: Diagnosis not present

## 2017-04-25 DIAGNOSIS — M1990 Unspecified osteoarthritis, unspecified site: Secondary | ICD-10-CM | POA: Diagnosis not present

## 2017-04-25 DIAGNOSIS — J449 Chronic obstructive pulmonary disease, unspecified: Secondary | ICD-10-CM | POA: Diagnosis not present

## 2017-04-25 DIAGNOSIS — Z886 Allergy status to analgesic agent status: Secondary | ICD-10-CM | POA: Diagnosis not present

## 2017-04-25 DIAGNOSIS — K769 Liver disease, unspecified: Secondary | ICD-10-CM | POA: Diagnosis not present

## 2017-04-25 DIAGNOSIS — Z79899 Other long term (current) drug therapy: Secondary | ICD-10-CM | POA: Diagnosis not present

## 2017-04-25 DIAGNOSIS — K219 Gastro-esophageal reflux disease without esophagitis: Secondary | ICD-10-CM | POA: Diagnosis not present

## 2017-04-25 DIAGNOSIS — Z9049 Acquired absence of other specified parts of digestive tract: Secondary | ICD-10-CM | POA: Diagnosis not present

## 2017-04-25 DIAGNOSIS — R197 Diarrhea, unspecified: Secondary | ICD-10-CM | POA: Diagnosis not present

## 2017-04-25 DIAGNOSIS — Z881 Allergy status to other antibiotic agents status: Secondary | ICD-10-CM | POA: Diagnosis not present

## 2017-04-25 DIAGNOSIS — Z882 Allergy status to sulfonamides status: Secondary | ICD-10-CM | POA: Diagnosis not present

## 2017-05-17 DIAGNOSIS — M545 Low back pain: Secondary | ICD-10-CM | POA: Diagnosis not present

## 2017-05-17 DIAGNOSIS — M961 Postlaminectomy syndrome, not elsewhere classified: Secondary | ICD-10-CM | POA: Diagnosis not present

## 2017-05-17 DIAGNOSIS — M542 Cervicalgia: Secondary | ICD-10-CM | POA: Diagnosis not present

## 2017-05-17 DIAGNOSIS — M5412 Radiculopathy, cervical region: Secondary | ICD-10-CM | POA: Diagnosis not present

## 2017-05-22 DIAGNOSIS — D126 Benign neoplasm of colon, unspecified: Secondary | ICD-10-CM | POA: Diagnosis not present

## 2017-05-31 DIAGNOSIS — M5136 Other intervertebral disc degeneration, lumbar region: Secondary | ICD-10-CM | POA: Diagnosis not present

## 2017-05-31 DIAGNOSIS — M5030 Other cervical disc degeneration, unspecified cervical region: Secondary | ICD-10-CM | POA: Diagnosis not present

## 2017-05-31 DIAGNOSIS — M961 Postlaminectomy syndrome, not elsewhere classified: Secondary | ICD-10-CM | POA: Diagnosis not present

## 2017-06-13 ENCOUNTER — Ambulatory Visit: Payer: Self-pay | Admitting: Neurology

## 2017-07-19 DIAGNOSIS — M792 Neuralgia and neuritis, unspecified: Secondary | ICD-10-CM | POA: Diagnosis not present

## 2017-07-19 DIAGNOSIS — G5790 Unspecified mononeuropathy of unspecified lower limb: Secondary | ICD-10-CM | POA: Diagnosis not present

## 2017-07-19 DIAGNOSIS — M5416 Radiculopathy, lumbar region: Secondary | ICD-10-CM | POA: Diagnosis not present

## 2017-07-19 DIAGNOSIS — G894 Chronic pain syndrome: Secondary | ICD-10-CM | POA: Diagnosis not present

## 2017-07-22 DIAGNOSIS — M545 Low back pain: Secondary | ICD-10-CM | POA: Diagnosis not present

## 2017-07-22 DIAGNOSIS — R05 Cough: Secondary | ICD-10-CM | POA: Diagnosis not present

## 2017-07-22 DIAGNOSIS — I10 Essential (primary) hypertension: Secondary | ICD-10-CM | POA: Diagnosis not present

## 2017-07-22 DIAGNOSIS — G259 Extrapyramidal and movement disorder, unspecified: Secondary | ICD-10-CM | POA: Diagnosis not present

## 2017-07-22 DIAGNOSIS — Z1389 Encounter for screening for other disorder: Secondary | ICD-10-CM | POA: Diagnosis not present

## 2017-07-22 DIAGNOSIS — R739 Hyperglycemia, unspecified: Secondary | ICD-10-CM | POA: Diagnosis not present

## 2017-07-22 DIAGNOSIS — Z1331 Encounter for screening for depression: Secondary | ICD-10-CM | POA: Diagnosis not present

## 2017-07-22 DIAGNOSIS — E039 Hypothyroidism, unspecified: Secondary | ICD-10-CM | POA: Diagnosis not present

## 2017-07-22 DIAGNOSIS — E782 Mixed hyperlipidemia: Secondary | ICD-10-CM | POA: Diagnosis not present

## 2017-09-04 DIAGNOSIS — H8111 Benign paroxysmal vertigo, right ear: Secondary | ICD-10-CM | POA: Diagnosis not present

## 2017-09-04 DIAGNOSIS — W57XXXA Bitten or stung by nonvenomous insect and other nonvenomous arthropods, initial encounter: Secondary | ICD-10-CM | POA: Diagnosis not present

## 2017-10-03 DIAGNOSIS — R197 Diarrhea, unspecified: Secondary | ICD-10-CM | POA: Diagnosis not present

## 2017-10-03 DIAGNOSIS — M5030 Other cervical disc degeneration, unspecified cervical region: Secondary | ICD-10-CM | POA: Diagnosis not present

## 2017-10-03 DIAGNOSIS — G259 Extrapyramidal and movement disorder, unspecified: Secondary | ICD-10-CM | POA: Diagnosis not present

## 2017-10-03 DIAGNOSIS — Z139 Encounter for screening, unspecified: Secondary | ICD-10-CM | POA: Diagnosis not present

## 2017-10-03 DIAGNOSIS — R1084 Generalized abdominal pain: Secondary | ICD-10-CM | POA: Diagnosis not present

## 2017-10-03 DIAGNOSIS — M961 Postlaminectomy syndrome, not elsewhere classified: Secondary | ICD-10-CM | POA: Diagnosis not present

## 2017-10-03 DIAGNOSIS — E782 Mixed hyperlipidemia: Secondary | ICD-10-CM | POA: Diagnosis not present

## 2017-10-03 DIAGNOSIS — H8111 Benign paroxysmal vertigo, right ear: Secondary | ICD-10-CM | POA: Diagnosis not present

## 2017-10-03 DIAGNOSIS — I10 Essential (primary) hypertension: Secondary | ICD-10-CM | POA: Diagnosis not present

## 2017-10-03 DIAGNOSIS — M5136 Other intervertebral disc degeneration, lumbar region: Secondary | ICD-10-CM | POA: Diagnosis not present

## 2017-10-03 DIAGNOSIS — K219 Gastro-esophageal reflux disease without esophagitis: Secondary | ICD-10-CM | POA: Diagnosis not present

## 2017-10-03 DIAGNOSIS — M5417 Radiculopathy, lumbosacral region: Secondary | ICD-10-CM | POA: Diagnosis not present

## 2017-10-08 DIAGNOSIS — M5136 Other intervertebral disc degeneration, lumbar region: Secondary | ICD-10-CM | POA: Diagnosis not present

## 2017-10-15 DIAGNOSIS — M5136 Other intervertebral disc degeneration, lumbar region: Secondary | ICD-10-CM | POA: Diagnosis not present

## 2017-10-15 DIAGNOSIS — M5030 Other cervical disc degeneration, unspecified cervical region: Secondary | ICD-10-CM | POA: Diagnosis not present

## 2017-10-15 DIAGNOSIS — M5417 Radiculopathy, lumbosacral region: Secondary | ICD-10-CM | POA: Diagnosis not present

## 2017-10-25 DIAGNOSIS — M961 Postlaminectomy syndrome, not elsewhere classified: Secondary | ICD-10-CM | POA: Diagnosis not present

## 2017-10-25 DIAGNOSIS — M5137 Other intervertebral disc degeneration, lumbosacral region: Secondary | ICD-10-CM | POA: Diagnosis not present

## 2017-11-12 DIAGNOSIS — M5136 Other intervertebral disc degeneration, lumbar region: Secondary | ICD-10-CM | POA: Diagnosis not present

## 2017-11-12 DIAGNOSIS — M5030 Other cervical disc degeneration, unspecified cervical region: Secondary | ICD-10-CM | POA: Diagnosis not present

## 2017-12-17 DIAGNOSIS — R1084 Generalized abdominal pain: Secondary | ICD-10-CM | POA: Diagnosis not present

## 2017-12-17 DIAGNOSIS — R197 Diarrhea, unspecified: Secondary | ICD-10-CM | POA: Diagnosis not present

## 2017-12-17 DIAGNOSIS — K219 Gastro-esophageal reflux disease without esophagitis: Secondary | ICD-10-CM | POA: Diagnosis not present

## 2017-12-17 DIAGNOSIS — I10 Essential (primary) hypertension: Secondary | ICD-10-CM | POA: Diagnosis not present

## 2017-12-17 DIAGNOSIS — H8111 Benign paroxysmal vertigo, right ear: Secondary | ICD-10-CM | POA: Diagnosis not present

## 2017-12-17 DIAGNOSIS — Z7689 Persons encountering health services in other specified circumstances: Secondary | ICD-10-CM | POA: Diagnosis not present

## 2017-12-17 DIAGNOSIS — Z23 Encounter for immunization: Secondary | ICD-10-CM | POA: Diagnosis not present

## 2017-12-17 DIAGNOSIS — G259 Extrapyramidal and movement disorder, unspecified: Secondary | ICD-10-CM | POA: Diagnosis not present

## 2018-01-17 DIAGNOSIS — M5412 Radiculopathy, cervical region: Secondary | ICD-10-CM | POA: Diagnosis not present

## 2018-01-17 DIAGNOSIS — M5033 Other cervical disc degeneration, cervicothoracic region: Secondary | ICD-10-CM | POA: Diagnosis not present

## 2018-01-17 DIAGNOSIS — M542 Cervicalgia: Secondary | ICD-10-CM | POA: Diagnosis not present

## 2018-01-17 DIAGNOSIS — M5010 Cervical disc disorder with radiculopathy, unspecified cervical region: Secondary | ICD-10-CM | POA: Diagnosis not present

## 2018-01-30 DIAGNOSIS — J449 Chronic obstructive pulmonary disease, unspecified: Secondary | ICD-10-CM | POA: Diagnosis not present

## 2018-01-30 DIAGNOSIS — K589 Irritable bowel syndrome without diarrhea: Secondary | ICD-10-CM | POA: Diagnosis not present

## 2018-01-30 DIAGNOSIS — I10 Essential (primary) hypertension: Secondary | ICD-10-CM | POA: Diagnosis not present

## 2018-01-30 DIAGNOSIS — E039 Hypothyroidism, unspecified: Secondary | ICD-10-CM | POA: Diagnosis not present

## 2018-01-30 DIAGNOSIS — R1032 Left lower quadrant pain: Secondary | ICD-10-CM | POA: Diagnosis not present

## 2018-01-30 DIAGNOSIS — Z79899 Other long term (current) drug therapy: Secondary | ICD-10-CM | POA: Diagnosis not present

## 2018-01-30 DIAGNOSIS — K760 Fatty (change of) liver, not elsewhere classified: Secondary | ICD-10-CM | POA: Diagnosis not present

## 2018-01-31 DIAGNOSIS — K760 Fatty (change of) liver, not elsewhere classified: Secondary | ICD-10-CM | POA: Diagnosis not present

## 2018-04-16 DIAGNOSIS — I10 Essential (primary) hypertension: Secondary | ICD-10-CM | POA: Diagnosis not present

## 2018-04-16 DIAGNOSIS — M545 Low back pain: Secondary | ICD-10-CM | POA: Diagnosis not present

## 2018-04-16 DIAGNOSIS — J209 Acute bronchitis, unspecified: Secondary | ICD-10-CM | POA: Diagnosis not present

## 2018-04-16 DIAGNOSIS — R197 Diarrhea, unspecified: Secondary | ICD-10-CM | POA: Diagnosis not present

## 2018-04-16 DIAGNOSIS — R1084 Generalized abdominal pain: Secondary | ICD-10-CM | POA: Diagnosis not present

## 2018-04-16 DIAGNOSIS — H8111 Benign paroxysmal vertigo, right ear: Secondary | ICD-10-CM | POA: Diagnosis not present

## 2018-04-16 DIAGNOSIS — R739 Hyperglycemia, unspecified: Secondary | ICD-10-CM | POA: Diagnosis not present

## 2018-04-16 DIAGNOSIS — E782 Mixed hyperlipidemia: Secondary | ICD-10-CM | POA: Diagnosis not present

## 2018-04-16 DIAGNOSIS — K219 Gastro-esophageal reflux disease without esophagitis: Secondary | ICD-10-CM | POA: Diagnosis not present

## 2018-04-16 DIAGNOSIS — G259 Extrapyramidal and movement disorder, unspecified: Secondary | ICD-10-CM | POA: Diagnosis not present

## 2018-05-07 ENCOUNTER — Ambulatory Visit (INDEPENDENT_AMBULATORY_CARE_PROVIDER_SITE_OTHER): Payer: Medicare Other | Admitting: Internal Medicine

## 2018-05-07 ENCOUNTER — Encounter (INDEPENDENT_AMBULATORY_CARE_PROVIDER_SITE_OTHER): Payer: Self-pay | Admitting: Internal Medicine

## 2018-05-07 VITALS — BP 120/83 | HR 85 | Temp 98.5°F | Ht 66.0 in | Wt 214.0 lb

## 2018-05-07 DIAGNOSIS — R197 Diarrhea, unspecified: Secondary | ICD-10-CM | POA: Diagnosis not present

## 2018-05-07 NOTE — Progress Notes (Signed)
Subjective:    [name], female    DOB: 05-Sep-1965, 53 y.o.   MRN: [id number]  HPI Referred by Dr. Nadara Mustard for abdominal pain/chronic diarrhea. Has had diarrhea for the past. The diarrhea is off and on. She has diarrhea daily or every other day. She does have a solid stool at times.  Sometimes she becomes constipated. Her appetite is okay. She doesn't want to eat because of the diarrhea. When she has diarrhea, it is water. Usually has 4-5 BMs a day at times. ? Antibiotic for bronchitis about 2 weeks. Diarrhea did not get worse with it.  Has been on Dicyclomine TID for 3-4 months.     04/25/2017  (Diarrhea)Biopsy from colonoscopy sent : polyp x 2. Fragments of tubular adenoma. No high grade dysplasia or malignancy identified. Fragments of benign colonic epithelium.   Disabled due to back.  Review of Systems Past Medical History:  Diagnosis Date  . Anxiety   . Asthma   . Cervicogenic headache 08/20/2016  . Chronic headaches   . Chronic pain   . Depression   . Emphysema   . Emphysema of lung ([place])   . GERD (gastroesophageal reflux disease)   . Hypertension   . Hypothyroidism   . Thyroid disease     Past Surgical History:  Procedure Laterality Date  . APPENDECTOMY    . BACK SURGERY     x4  . COLONOSCOPY W/ POLYPECTOMY  07/2013   1 polyp removal  . CYST REMOVAL HAND  2015  . EXCISION MASS UPPER EXTREMETIES Left 02/25/2017   Procedure: EXCISION MASS UPPER EXTREMETIES, LEFT PALM;  Surgeon: Leanora Cover, MD;  Location: [place];  Service: Orthopedics;  Laterality: Left;    Allergies  Allergen Reactions  . Pneumococcal Vaccines Itching and Other (See Comments)    Patient received Influenza and Pneumococcal vaccines at the same time-caused severe itching and needs medication to reverse reaction, this caused her to go into cardiac arrest at that time.    Marland Kitchen Amoxicillin Hives  . Codeine Nausea And Vomiting  . Hydromorphone Hcl Nausea And  Vomiting  . Sulfonamide Derivatives Hives  . Morphine And Related Hives    Current Outpatient Medications on File Prior to Visit  Medication Sig Dispense Refill  . ALPRAZolam (XANAX) 1 MG tablet Take 1 mg by mouth 4 (four) times daily.     . baclofen (LIORESAL) 10 MG tablet TAKE 1 TABLET BY MOUTH TWICE DAILY AS NEEDED FOR MUSCLE SPASMS  6  . dicyclomine (BENTYL) 20 MG tablet Take 20 mg by mouth 3 (three) times daily as needed.  2  . escitalopram (LEXAPRO) 20 MG tablet Take 20 mg by mouth daily.    Marland Kitchen esomeprazole (NEXIUM) 40 MG capsule Take 40 mg by mouth 2 (two) times daily as needed (takes 1 tablet daily, then 1 addt'l tablet if needed).     Marland Kitchen HYDROcodone-acetaminophen (NORCO) 5-325 MG per tablet Take 1-2 tablets by mouth every 6 (six) hours as needed for severe pain. 15 tablet 0  . levothyroxine (SYNTHROID, LEVOTHROID) 175 MCG tablet Take 175 mcg by mouth daily.  2  . PROAIR HFA 108 (90 BASE) MCG/ACT inhaler Inhale 2 puffs into the lungs every 4 (four) hours as needed for wheezing or shortness of breath.     . QUEtiapine (SEROQUEL) 300 MG tablet Take 300 mg by mouth at bedtime.    Marland Kitchen rOPINIRole (REQUIP) 4 MG tablet Take 4 mg by mouth 2 (two) times  daily.     . valACYclovir (VALTREX) 1000 MG tablet TAKE 1 TABLET BY MOUTH DAILY as needed     No current facility-administered medications on file prior to visit.         Objective:   Physical Exam Blood pressure 120/83, pulse 85, temperature 98.5 F (36.9 C), height 5\' 6"  (1.676 m), weight 214 lb (97.1 kg). Alert and oriented. Skin warm and dry. Oral mucosa is moist.   . Sclera anicteric, conjunctivae is pink. Thyroid not enlarged. No cervical lymphadenopathy. Lungs clear. Heart regular rate and rhythm.  Abdomen is soft. Bowel sounds are positive. No hepatomegaly. No abdominal masses felt. No tenderness.  No edema to lower extremities.           Assessment & Plan:  Diarrhea. Will get GI pathogen.  Continue the Dicyclomine. Further  recommendations to follow.

## 2018-05-07 NOTE — Patient Instructions (Signed)
Get fiber pills OTC. Will get a GI pathogen.

## 2018-07-11 DIAGNOSIS — R197 Diarrhea, unspecified: Secondary | ICD-10-CM | POA: Diagnosis not present

## 2018-07-11 DIAGNOSIS — K219 Gastro-esophageal reflux disease without esophagitis: Secondary | ICD-10-CM | POA: Diagnosis not present

## 2018-07-11 DIAGNOSIS — I10 Essential (primary) hypertension: Secondary | ICD-10-CM | POA: Diagnosis not present

## 2018-07-11 DIAGNOSIS — G259 Extrapyramidal and movement disorder, unspecified: Secondary | ICD-10-CM | POA: Diagnosis not present

## 2018-07-11 DIAGNOSIS — H8111 Benign paroxysmal vertigo, right ear: Secondary | ICD-10-CM | POA: Diagnosis not present

## 2018-08-29 DIAGNOSIS — I10 Essential (primary) hypertension: Secondary | ICD-10-CM | POA: Diagnosis not present

## 2018-08-29 DIAGNOSIS — R739 Hyperglycemia, unspecified: Secondary | ICD-10-CM | POA: Diagnosis not present

## 2018-08-29 DIAGNOSIS — E039 Hypothyroidism, unspecified: Secondary | ICD-10-CM | POA: Diagnosis not present

## 2018-08-29 DIAGNOSIS — G259 Extrapyramidal and movement disorder, unspecified: Secondary | ICD-10-CM | POA: Diagnosis not present

## 2018-08-29 DIAGNOSIS — L02211 Cutaneous abscess of abdominal wall: Secondary | ICD-10-CM | POA: Diagnosis not present

## 2018-08-29 DIAGNOSIS — K210 Gastro-esophageal reflux disease with esophagitis: Secondary | ICD-10-CM | POA: Diagnosis not present

## 2018-08-29 DIAGNOSIS — R05 Cough: Secondary | ICD-10-CM | POA: Diagnosis not present

## 2018-09-19 ENCOUNTER — Other Ambulatory Visit: Payer: Self-pay

## 2018-09-19 NOTE — Patient Outreach (Signed)
[facility]) Care Management  09/19/2018  Katherine Hayden 12-Oct-1965 [id number]   Medication Adherence call to Katherine Hayden HIPPA Compliant Voice message left with a call back number. Katherine Hayden is showing past due on Lisinopril/Hctz 20/12.5 mg under [place].  [place] Management Direct Dial [phone]  Fax [phone] [email or web address]

## 2018-10-03 DIAGNOSIS — K219 Gastro-esophageal reflux disease without esophagitis: Secondary | ICD-10-CM | POA: Diagnosis not present

## 2018-10-03 DIAGNOSIS — M542 Cervicalgia: Secondary | ICD-10-CM | POA: Diagnosis not present

## 2018-10-03 DIAGNOSIS — Z79899 Other long term (current) drug therapy: Secondary | ICD-10-CM | POA: Diagnosis not present

## 2018-10-03 DIAGNOSIS — M4692 Unspecified inflammatory spondylopathy, cervical region: Secondary | ICD-10-CM | POA: Diagnosis not present

## 2018-10-03 DIAGNOSIS — M47812 Spondylosis without myelopathy or radiculopathy, cervical region: Secondary | ICD-10-CM | POA: Diagnosis not present

## 2018-10-03 DIAGNOSIS — I10 Essential (primary) hypertension: Secondary | ICD-10-CM | POA: Diagnosis not present

## 2018-10-03 DIAGNOSIS — E039 Hypothyroidism, unspecified: Secondary | ICD-10-CM | POA: Diagnosis not present

## 2018-10-03 DIAGNOSIS — J449 Chronic obstructive pulmonary disease, unspecified: Secondary | ICD-10-CM | POA: Diagnosis not present

## 2018-10-03 DIAGNOSIS — M50322 Other cervical disc degeneration at C5-C6 level: Secondary | ICD-10-CM | POA: Diagnosis not present

## 2018-10-03 DIAGNOSIS — M19011 Primary osteoarthritis, right shoulder: Secondary | ICD-10-CM | POA: Diagnosis not present

## 2018-10-03 DIAGNOSIS — R51 Headache: Secondary | ICD-10-CM | POA: Diagnosis not present

## 2018-10-03 DIAGNOSIS — M25511 Pain in right shoulder: Secondary | ICD-10-CM | POA: Diagnosis not present

## 2018-10-15 DIAGNOSIS — L02211 Cutaneous abscess of abdominal wall: Secondary | ICD-10-CM | POA: Diagnosis not present

## 2018-10-28 ENCOUNTER — Other Ambulatory Visit: Payer: Self-pay

## 2018-10-28 NOTE — Patient Outreach (Signed)
[facility]) Care Management  10/28/2018  Shakyia Bosso 20-Aug-1965 [id number]  Medication Adherence call to Mrs. Zanaria Morell [place] Identifiers Verify spoke with patient she is past due on Lisinopril/Hctz 20/12.5 mg patient explain she is no longer taking this medication doctor took her off.Mrs. Mangione is showing past due under [place].   [place] Management Direct Dial [phone]  Fax [phone] [email or web address]

## 2018-11-03 DIAGNOSIS — M542 Cervicalgia: Secondary | ICD-10-CM | POA: Diagnosis not present

## 2018-11-03 DIAGNOSIS — M5030 Other cervical disc degeneration, unspecified cervical region: Secondary | ICD-10-CM | POA: Diagnosis not present

## 2018-11-03 DIAGNOSIS — M5136 Other intervertebral disc degeneration, lumbar region: Secondary | ICD-10-CM | POA: Diagnosis not present

## 2018-11-12 DIAGNOSIS — M5030 Other cervical disc degeneration, unspecified cervical region: Secondary | ICD-10-CM | POA: Diagnosis not present

## 2018-11-19 DIAGNOSIS — M5030 Other cervical disc degeneration, unspecified cervical region: Secondary | ICD-10-CM | POA: Diagnosis not present

## 2018-11-19 DIAGNOSIS — M5136 Other intervertebral disc degeneration, lumbar region: Secondary | ICD-10-CM | POA: Diagnosis not present

## 2018-11-19 DIAGNOSIS — M5020 Other cervical disc displacement, unspecified cervical region: Secondary | ICD-10-CM | POA: Diagnosis not present

## 2018-11-26 DIAGNOSIS — Z1389 Encounter for screening for other disorder: Secondary | ICD-10-CM | POA: Diagnosis not present

## 2018-11-26 DIAGNOSIS — J209 Acute bronchitis, unspecified: Secondary | ICD-10-CM | POA: Diagnosis not present

## 2018-11-26 DIAGNOSIS — G259 Extrapyramidal and movement disorder, unspecified: Secondary | ICD-10-CM | POA: Diagnosis not present

## 2018-11-26 DIAGNOSIS — I10 Essential (primary) hypertension: Secondary | ICD-10-CM | POA: Diagnosis not present

## 2018-11-26 DIAGNOSIS — R739 Hyperglycemia, unspecified: Secondary | ICD-10-CM | POA: Diagnosis not present

## 2018-11-26 DIAGNOSIS — E039 Hypothyroidism, unspecified: Secondary | ICD-10-CM | POA: Diagnosis not present

## 2018-11-26 DIAGNOSIS — R05 Cough: Secondary | ICD-10-CM | POA: Diagnosis not present

## 2018-11-26 DIAGNOSIS — K210 Gastro-esophageal reflux disease with esophagitis: Secondary | ICD-10-CM | POA: Diagnosis not present

## 2018-11-26 DIAGNOSIS — L02211 Cutaneous abscess of abdominal wall: Secondary | ICD-10-CM | POA: Diagnosis not present

## 2018-11-26 DIAGNOSIS — H8111 Benign paroxysmal vertigo, right ear: Secondary | ICD-10-CM | POA: Diagnosis not present

## 2018-12-02 DIAGNOSIS — M5020 Other cervical disc displacement, unspecified cervical region: Secondary | ICD-10-CM | POA: Diagnosis not present

## 2018-12-02 DIAGNOSIS — G542 Cervical root disorders, not elsewhere classified: Secondary | ICD-10-CM | POA: Diagnosis not present

## 2018-12-02 DIAGNOSIS — M542 Cervicalgia: Secondary | ICD-10-CM | POA: Diagnosis not present

## 2018-12-05 ENCOUNTER — Ambulatory Visit: Payer: Self-pay | Admitting: Orthopedic Surgery

## 2018-12-11 ENCOUNTER — Ambulatory Visit: Payer: Self-pay | Admitting: Orthopedic Surgery

## 2018-12-12 ENCOUNTER — Ambulatory Visit: Payer: Self-pay | Admitting: Orthopedic Surgery

## 2018-12-12 DIAGNOSIS — G542 Cervical root disorders, not elsewhere classified: Secondary | ICD-10-CM | POA: Diagnosis not present

## 2018-12-12 NOTE — H&P (Signed)
Subjective:   Katherine Hayden is a pleasant 53 year old female with past medical history significant for morbid obesity (BMI 40.2) and tobacco use disorder (current every day half a pack per day smoker 40 years) whose chief complaint today is neck pain and radicular right arm pain that is severe and has failed to improve with conservative treatment measures including Over-the-counter medications, narcotic medications, Physical therapy, injection therapy, and time. Because the patient failed to have significant improvement with conservative treatment measures and her pain continues to be severe and debilitating altering her quality-of-life, she has decided to move forward with surgical intervention and she is scheduled for an ACDF C5-6 on 12/17/18 At [facility] with Dr. Rolena Infante. We have obtained preoperative clearance from the patient's primary care provider without any recommendations. The patient is scheduled for her preop exam at [facility] this upcoming Tuesday. The patient is scheduled to have her Aspen collar fitted following this appointment downstairs with physical therapy.  Patient Active Problem List   Diagnosis Date Noted  . Cervicogenic headache 08/20/2016  . Dizzy 08/20/2016  . SMOKER 03/10/2010  . DEPRESSION 03/10/2010  . ASTHMA 03/10/2010  . CONJUNCTIVITIS, ACUTE 12/05/2007  . SARCOIDOSIS 11/01/2006  . Hypothyroidism 11/01/2006  . GERD 11/01/2006  . 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 Anthony DISEASE, LUMBAR 11/01/2006  . BURSITIS, SHOULDER 11/01/2006   Past Medical History:  Diagnosis Date  . Anxiety   . Asthma   . Cervicogenic headache 08/20/2016  . Chronic headaches   . Chronic pain   . Depression   . Emphysema   . Emphysema of lung ([place])   . GERD (gastroesophageal reflux disease)   . Hypertension   . Hypothyroidism   . Thyroid disease     Past Surgical History:  Procedure Laterality Date  . APPENDECTOMY    . BACK SURGERY     x4  .  COLONOSCOPY W/ POLYPECTOMY  07/2013   1 polyp removal  . CYST REMOVAL HAND  2015  . EXCISION MASS UPPER EXTREMETIES Left 02/25/2017   Procedure: EXCISION MASS UPPER EXTREMETIES, LEFT PALM;  Surgeon: Leanora Cover, MD;  Location: [place];  Service: Orthopedics;  Laterality: Left;    Current Outpatient Medications  Medication Sig Dispense Refill Last Dose  . ALPRAZolam (XANAX) 1 MG tablet Take 1 mg by mouth 4 (four) times daily as needed for anxiety.    Taking  . Apple Cider Vinegar 500 MG TABS Take 1,000 mg by mouth daily.     Marland Kitchen atorvastatin (LIPITOR) 20 MG tablet Take 20 mg by mouth at bedtime.     . baclofen (LIORESAL) 10 MG tablet Take 10 mg by mouth 2 (two) times daily as needed for muscle spasms.   6 Taking  . dicyclomine (BENTYL) 20 MG tablet Take 20 mg by mouth 3 (three) times daily before meals.   2 Taking  . escitalopram (LEXAPRO) 10 MG tablet Take 10 mg by mouth daily.    Taking  . esomeprazole (NEXIUM) 40 MG capsule Take 40 mg by mouth 2 (two) times daily as needed (takes 1 tablet daily, then 1 addt'l tablet if needed).    Taking  . HYDROcodone-acetaminophen (NORCO) 10-325 MG tablet Take 1 tablet by mouth every 6 (six) hours as needed for moderate pain.     Marland Kitchen ibuprofen (ADVIL) 200 MG tablet Take 400-600 mg by mouth every 8 (eight) hours as needed for headache.     . levothyroxine (SYNTHROID) 200 MCG tablet Take 200 mcg  by mouth daily before breakfast. 225 mcg total  2 Taking  . levothyroxine (SYNTHROID) 25 MCG tablet Take 25 mcg by mouth daily before breakfast. 225 mcg total every morning     . montelukast (SINGULAIR) 10 MG tablet Take 10 mg by mouth at bedtime.     . ondansetron (ZOFRAN) 8 MG tablet Take 8 mg by mouth every 8 (eight) hours as needed for nausea or vomiting.     Marland Kitchen PROAIR HFA 108 (90 BASE) MCG/ACT inhaler Inhale 2 puffs into the lungs every 4 (four) hours as needed for wheezing or shortness of breath.    Taking  . QUEtiapine (SEROQUEL) 300 MG tablet Take  300 mg by mouth at bedtime.   Taking  . rOPINIRole (REQUIP) 4 MG tablet Take 4 mg by mouth at bedtime.    Taking  . valACYclovir (VALTREX) 1000 MG tablet Take 1,000 mg by mouth daily as needed (cold sores).    Taking   No current facility-administered medications for this visit.    Allergies  Allergen Reactions  . Pneumococcal Vaccines Itching and Other (See Comments)    Patient received Influenza and Pneumococcal vaccines at the same time-caused severe itching and needs medication to reverse reaction, this caused her to go into cardiac arrest at that time.    Marland Kitchen Amoxicillin Hives  . Codeine Nausea And Vomiting  . Hydromorphone Hcl Nausea And Vomiting  . Sulfonamide Derivatives Hives  . Morphine And Related Hives    Social History   Tobacco Use  . Smoking status: Current Every Day Smoker    Packs/day: 0.50    Types: Cigarettes  . Smokeless tobacco: Never Used  Substance Use Topics  . Alcohol use: Yes    Frequency: Never    Comment: rare    Family History  Problem Relation Age of Onset  . Hypertension Mother   . Hypertension Father   . Heart disease Maternal Grandfather   . Heart disease Paternal Grandmother     Review of Systems As stated in HPI  Objective:   Clinical exam: Katherine Hayden is a pleasant individual, who appears younger than their stated age. She Is alert and orientated 3.  Heart: Regular rate and rhythm, no rubs, murmurs, or gallops.  Lungs: No shortness of breath, chest pain. Clear auscultation bilaterally  Abdomen is soft and non-tender, negative loss of bowel and bladder control, no rebound tenderness. Bowel sounds 4  Negative skin lesions abrasions contusions  Peripheral pulses: 2+ radial artery pulses bilaterally. Compartment soft and nontender.  Gait pattern: Normal  Assistive devices: None  Neuro: Positive numbness and dysesthesias in the right C6 dermatome. Positive Spurling sign with reproduction of right C6 radicular pain. 5/5 motor strength  bilaterally in the upper extremity. Negative Hoffman test, negative clonus, 1+ deep tendon reflexes in the upper extremities symmetrically.  Musculoskeletal: Significant neck pain radiating into the right upper extremity. Pain and loss in range of motion of the cervical spine. Positive occipital headaches. No significant shoulder, elbow, wrist pain with isolated joint range of motion.  X-rays of the cervical spine demonstrate degenerative disc disease with no loss in lordosis. No acute fracture seen  MRI of the cervical spine dated 11/12/18: Moderate right lateral recess stenosis at C5-6 with displacement and compression of the right C6 nerve root as well as possibly even the right C7. No cord signal changes. There is a hard disc osteophyte in the posterior lateral corner. Also the left vertebral artery loop seen contacting and possibly displacing the exiting  left C6 nerve root. No cord signal changes  Assessment:   Katherine Hayden is a pleasant 53 year old female with past medical history significant for morbid obesity (BMI 40.2) and tobacco use disorder (current every day half a pack per day smoker 40 years) whose chief complaint today is neck pain and radicular right arm pain that is severe. Physical exam is significant for dysesthesias in the right C6 dermatome pattern. MRI confirms right C6 nerve compression due to the hard disc osteophyte in the posterior lateral corner. Because the patient failed to have significant improvement with conservative treatment measures and her pain continues to be severe and debilitating altering her quality-of-life, she has decided to move forward with surgical intervention and she is scheduled for an ACDF C5-6 on 12/17/18 At [facility] with Dr. Rolena Infante.  Plan:   C5-6 ACDF  Risks and benefits of surgery were discussed with the patient. These include: Infection, bleeding, death, stroke, paralysis, ongoing or worse pain, need for additional surgery, nonunion, leak of  spinal fluid, adjacent segment degeneration requiring additional fusion surgery. Pseudoarthrosis (nonunion)requiring supplemental posterior fixation. Throat pain, swallowing difficulties, hoarseness or change in voice.  With respect to disc replacement: Additional risks include heterotopic ossification, inability to place the disc due to technical issues requiring bailout to a fusion procedure.  We have also discussed the goals of surgery to include: Goals of surgery: Reduction in pain, and improvement in quality of life.  We have also discussed the post-operative recovery period to include: bathing/showering restrictions, wound healing, activity (and driving) restrictions, medications/pain mangement.  We have also discussed post-operative redflags to include: signs and symptoms of postoperative infection, DVT/PE.  All patients questions were invited and answered.  Follow-up: 2 weeks status post surgery

## 2018-12-12 NOTE — Progress Notes (Signed)
[place], [place] [id number] [place] [id number] Phone: [phone] Fax: [phone]      Your procedure is scheduled on Wednesday, December 17, 2018 .  Report to [facility] Main Entrance "A" at 10:30 A.M., and check in at the Admitting office.  Call this number if you have problems the morning of surgery:  [phone]  Call [phone] if you have any questions prior to your surgery date Monday-Friday 8am-4pm    Remember:  Do not eat or drink after midnight the night before your surgery   Take these medicines the morning of surgery with A SIP OF WATER: escitalopram (LEXAPRO) esomeprazole (NEXIUM) levothyroxine (SYNTHROID)                Take these medicines as needed: ALPRAZolam (XANAX) baclofen (LIORESAL)  HYDROcodone-acetaminophen (NORCO) ondansetron (ZOFRAN) PROAIR HFA 108 (90 BASE)-inhaler valACYclovir (VALTREX)  7 days prior to surgery STOP taking any Aspirin (unless otherwise instructed by your surgeon), Aleve, Naproxen, Ibuprofen, Motrin, Advil, Goody's, BC's, all herbal medications, fish oil, and all vitamins.    The Morning of Surgery  Do not wear jewelry, make-up or nail polish.  Do not wear lotions, powders, or perfumes/colognes, or deodorant  Do not shave 48 hours prior to surgery.  Men may shave face and neck.  Do not bring valuables to the hospital.  [facility] is not responsible for any belongings or valuables.  If you are a smoker, DO NOT Smoke 24 hours prior to surgery IF you wear a CPAP at night please bring your mask, tubing, and machine the morning of surgery   Remember that you must have someone to transport you home after your surgery, and remain with you for 24 hours if you are discharged the same day.   Contacts, glasses, hearing aids, dentures or bridgework may not be worn into surgery.    Leave your suitcase in the car.  After surgery it may be brought to your room.  For patients admitted to  the hospital, discharge time will be determined by your treatment team.  Patients discharged the day of surgery will not be allowed to drive home.    Special instructions:   [facility]- Preparing For Surgery  Before surgery, you can play an important role. Because skin is not sterile, your skin needs to be as free of germs as possible. You can reduce the number of germs on your skin by washing with CHG (chlorahexidine gluconate) Soap before surgery.  CHG is an antiseptic cleaner which kills germs and bonds with the skin to continue killing germs even after washing.    Oral Hygiene is also important to reduce your risk of infection.  Remember - BRUSH YOUR TEETH THE MORNING OF SURGERY WITH YOUR REGULAR TOOTHPASTE  Please do not use if you have an allergy to CHG or antibacterial soaps. If your skin becomes reddened/irritated stop using the CHG.  Do not shave (including legs and underarms) for at least 48 hours prior to first CHG shower. It is OK to shave your face.  Please follow these instructions carefully.   1. Shower the NIGHT BEFORE SURGERY and the MORNING OF SURGERY with CHG Soap.   2. If you chose to wash your hair, wash your hair first as usual with your normal shampoo.  3. After you shampoo, rinse your hair and body thoroughly to remove the shampoo.  4. Use CHG as you would any other liquid soap. You can  apply CHG directly to the skin and wash gently with a scrungie or a clean washcloth.   5. Apply the CHG Soap to your body ONLY FROM THE NECK DOWN.  Do not use on open wounds or open sores. Avoid contact with your eyes, ears, mouth and genitals (private parts). Wash Face and genitals (private parts)  with your normal soap.   6. Wash thoroughly, paying special attention to the area where your surgery will be performed.  7. Thoroughly rinse your body with warm water from the neck down.  8. DO NOT shower/wash with your normal soap after using and rinsing off the CHG Soap.  9. Pat  yourself dry with a CLEAN TOWEL.  10. Wear CLEAN PAJAMAS to bed the night before surgery, wear comfortable clothes the morning of surgery  11. Place CLEAN SHEETS on your bed the night of your first shower and DO NOT SLEEP WITH PETS.    Day of Surgery:  Do not apply any deodorants/lotions. Please shower the morning of surgery with the CHG soap  Please wear clean clothes to the hospital/surgery center.   Remember to brush your teeth WITH YOUR REGULAR TOOTHPASTE.   Please read over the following fact sheets that you were given.

## 2018-12-16 ENCOUNTER — Encounter (HOSPITAL_COMMUNITY): Payer: Self-pay

## 2018-12-16 ENCOUNTER — Other Ambulatory Visit: Payer: Self-pay

## 2018-12-16 ENCOUNTER — Other Ambulatory Visit (HOSPITAL_COMMUNITY)
Admission: RE | Admit: 2018-12-16 | Discharge: 2018-12-16 | Disposition: A | Discharge status: Home / Self Care | Payer: Medicare Other | Source: Ambulatory Visit | Attending: Orthopedic Surgery | Admitting: Orthopedic Surgery

## 2018-12-16 ENCOUNTER — Ambulatory Visit (HOSPITAL_COMMUNITY)
Admission: RE | Admit: 2018-12-16 | Discharge: 2018-12-16 | Disposition: A | Discharge status: Home / Self Care | Payer: Medicare Other | Source: Ambulatory Visit | Attending: Orthopedic Surgery | Admitting: Orthopedic Surgery

## 2018-12-16 ENCOUNTER — Encounter (HOSPITAL_COMMUNITY)
Admission: RE | Admit: 2018-12-16 | Discharge: 2018-12-16 | Disposition: A | Discharge status: Home / Self Care | Payer: Medicare Other | Source: Ambulatory Visit | Attending: Orthopedic Surgery | Admitting: Orthopedic Surgery

## 2018-12-16 DIAGNOSIS — F419 Anxiety disorder, unspecified: Secondary | ICD-10-CM | POA: Diagnosis not present

## 2018-12-16 DIAGNOSIS — I10 Essential (primary) hypertension: Secondary | ICD-10-CM | POA: Insufficient documentation

## 2018-12-16 DIAGNOSIS — Z01818 Encounter for other preprocedural examination: Secondary | ICD-10-CM | POA: Insufficient documentation

## 2018-12-16 DIAGNOSIS — Z79899 Other long term (current) drug therapy: Secondary | ICD-10-CM | POA: Diagnosis not present

## 2018-12-16 DIAGNOSIS — J45909 Unspecified asthma, uncomplicated: Secondary | ICD-10-CM | POA: Insufficient documentation

## 2018-12-16 DIAGNOSIS — K219 Gastro-esophageal reflux disease without esophagitis: Secondary | ICD-10-CM | POA: Diagnosis not present

## 2018-12-16 DIAGNOSIS — Z79890 Hormone replacement therapy (postmenopausal): Secondary | ICD-10-CM | POA: Diagnosis not present

## 2018-12-16 DIAGNOSIS — M50122 Cervical disc disorder at C5-C6 level with radiculopathy: Secondary | ICD-10-CM | POA: Diagnosis not present

## 2018-12-16 DIAGNOSIS — Z20828 Contact with and (suspected) exposure to other viral communicable diseases: Secondary | ICD-10-CM | POA: Diagnosis not present

## 2018-12-16 DIAGNOSIS — J439 Emphysema, unspecified: Secondary | ICD-10-CM | POA: Insufficient documentation

## 2018-12-16 DIAGNOSIS — E039 Hypothyroidism, unspecified: Secondary | ICD-10-CM | POA: Insufficient documentation

## 2018-12-16 DIAGNOSIS — F17210 Nicotine dependence, cigarettes, uncomplicated: Secondary | ICD-10-CM | POA: Insufficient documentation

## 2018-12-16 DIAGNOSIS — Z01811 Encounter for preprocedural respiratory examination: Secondary | ICD-10-CM | POA: Diagnosis not present

## 2018-12-16 HISTORY — DX: Unspecified osteoarthritis, unspecified site: M19.90

## 2018-12-16 LAB — CBC
HCT: 41.5 % (ref 36.0–46.0)
Hemoglobin: 13.1 g/dL (ref 12.0–15.0)
MCH: 30.6 pg (ref 26.0–34.0)
MCHC: 31.6 g/dL (ref 30.0–36.0)
MCV: 97 fL (ref 80.0–100.0)
Platelets: 235 10*3/uL (ref 150–400)
RBC: 4.28 MIL/uL (ref 3.87–5.11)
RDW: 13.2 % (ref 11.5–15.5)
WBC: 9.9 10*3/uL (ref 4.0–10.5)
nRBC: 0 % (ref 0.0–0.2)

## 2018-12-16 LAB — BASIC METABOLIC PANEL
Anion gap: 7 (ref 5–15)
BUN: 8 mg/dL (ref 6–20)
CO2: 28 mmol/L (ref 22–32)
Calcium: 8.7 mg/dL — ABNORMAL LOW (ref 8.9–10.3)
Chloride: 106 mmol/L (ref 98–111)
Creatinine, Ser: 0.69 mg/dL (ref 0.44–1.00)
GFR calc Af Amer: >60 mL/min (ref >60)
GFR calc non Af Amer: >60 mL/min (ref >60)
Glucose, Bld: 89 mg/dL (ref 70–99)
Potassium: 3.5 mmol/L (ref 3.5–5.1)
Sodium: 141 mmol/L (ref 135–145)

## 2018-12-16 LAB — URINALYSIS, ROUTINE W REFLEX MICROSCOPIC
Bilirubin Urine: NEGATIVE
Glucose, UA: NEGATIVE mg/dL
Hgb urine dipstick: NEGATIVE
Ketones, ur: NEGATIVE mg/dL
Leukocytes,Ua: NEGATIVE
Nitrite: NEGATIVE
Protein, ur: NEGATIVE mg/dL
Specific Gravity, Urine: 1.017 (ref 1.005–1.030)
pH: 6 (ref 5.0–8.0)

## 2018-12-16 LAB — SURGICAL PCR SCREEN
MRSA, PCR: NEGATIVE
Staphylococcus aureus: NEGATIVE

## 2018-12-16 LAB — PROTIME-INR
INR: 1 (ref 0.8–1.2)
Prothrombin Time: 12.6 seconds (ref 11.4–15.2)

## 2018-12-16 LAB — SARS CORONAVIRUS 2 (TAT 6-24 HRS): SARS Coronavirus 2: NEGATIVE

## 2018-12-16 LAB — APTT: aPTT: 31 seconds (ref 24–36)

## 2018-12-16 MED ORDER — VANCOMYCIN HCL IN DEXTROSE 1-5 GM/200ML-% IV SOLN
1000.0000 mg | INTRAVENOUS | Status: AC
  Administered 2018-12-17: 1000 mg via INTRAVENOUS
  Filled 2018-12-16 (×2): qty 200

## 2018-12-16 NOTE — Progress Notes (Signed)
PCP - Dr. Rory Percy, DO Cardiologist - N/A  Chest x-ray - 12/16/18 EKG - 12/16/18 Stress Test - N/A ECHO - 2009 Cardiac Cath - N/A  Sleep Study - N/A CPAP - N/A STOP BANG - 5; Results being sent to PCP 12/16/2018  Blood Thinner Instructions: N/A Aspirin Instructions: N/A  Anesthesia review: No  Patient denies shortness of breath, fever, cough and chest pain at PAT appointment   Coronavirus Screening  Have you experienced the following symptoms:  Cough yes/no: No Fever (>100.35F)  yes/no: No Runny nose yes/no: No Sore throat yes/no: No Difficulty breathing/shortness of breath  yes/no: No  Have you or a family member traveled in the last 14 days and where? yes/no: No   If the patient indicates "YES" to the above questions, their PAT will be rescheduled to limit the exposure to others and, the surgeon will be notified. THE PATIENT WILL NEED TO BE ASYMPTOMATIC FOR 14 DAYS.   If the patient is not experiencing any of these symptoms, the PAT nurse will instruct them to NOT bring anyone with them to their appointment since they may have these symptoms or traveled as well.   Please remind your patients and families that hospital visitation restrictions are in effect and the importance of the restrictions.  Patient verbalized understanding of instructions that were given to them at the PAT appointment. Patient was also instructed that they will need to review over the PAT instructions again at home before surgery.

## 2018-12-16 NOTE — Progress Notes (Signed)
   12/16/18 0958  OBSTRUCTIVE SLEEP APNEA  Have you ever been diagnosed with sleep apnea through a sleep study? No  Do you snore loudly (loud enough to be heard through closed doors)?  1  Do you often feel tired, fatigued, or sleepy during the daytime (such as falling asleep during driving or talking to someone)? 1  Has anyone observed you stop breathing during your sleep? 0  Do you have, or are you being treated for high blood pressure? 1  BMI more than 35 kg/m2? 1  Age > 50 (1-yes) 1  Neck circumference greater than:Female 16 inches or larger, Female 17inches or larger? 0  Female Gender (Yes=1) 0  Obstructive Sleep Apnea Score 5

## 2018-12-17 ENCOUNTER — Encounter (HOSPITAL_COMMUNITY): Payer: Self-pay

## 2018-12-17 ENCOUNTER — Other Ambulatory Visit: Payer: Self-pay

## 2018-12-17 ENCOUNTER — Ambulatory Visit (HOSPITAL_COMMUNITY): Payer: Medicare Other | Admitting: Anesthesiology

## 2018-12-17 ENCOUNTER — Observation Stay (HOSPITAL_COMMUNITY)
Admission: RE | Admit: 2018-12-17 | Discharge: 2018-12-18 | Disposition: A | Discharge status: Home / Self Care | Payer: Medicare Other | Attending: Orthopedic Surgery | Admitting: Orthopedic Surgery

## 2018-12-17 ENCOUNTER — Ambulatory Visit (HOSPITAL_COMMUNITY): Payer: Medicare Other

## 2018-12-17 ENCOUNTER — Encounter (HOSPITAL_COMMUNITY)
Admission: RE | Disposition: A | Discharge status: Home / Self Care | Payer: Self-pay | Source: Home / Self Care | Attending: Orthopedic Surgery

## 2018-12-17 DIAGNOSIS — Z981 Arthrodesis status: Secondary | ICD-10-CM | POA: Diagnosis not present

## 2018-12-17 DIAGNOSIS — J439 Emphysema, unspecified: Secondary | ICD-10-CM | POA: Insufficient documentation

## 2018-12-17 DIAGNOSIS — M4722 Other spondylosis with radiculopathy, cervical region: Secondary | ICD-10-CM | POA: Diagnosis not present

## 2018-12-17 DIAGNOSIS — F419 Anxiety disorder, unspecified: Secondary | ICD-10-CM | POA: Diagnosis not present

## 2018-12-17 DIAGNOSIS — M50122 Cervical disc disorder at C5-C6 level with radiculopathy: Secondary | ICD-10-CM | POA: Insufficient documentation

## 2018-12-17 DIAGNOSIS — J45909 Unspecified asthma, uncomplicated: Secondary | ICD-10-CM | POA: Diagnosis not present

## 2018-12-17 DIAGNOSIS — Z419 Encounter for procedure for purposes other than remedying health state, unspecified: Secondary | ICD-10-CM

## 2018-12-17 DIAGNOSIS — Z6837 Body mass index (BMI) 37.0-37.9, adult: Secondary | ICD-10-CM | POA: Diagnosis not present

## 2018-12-17 DIAGNOSIS — Z79899 Other long term (current) drug therapy: Secondary | ICD-10-CM | POA: Diagnosis not present

## 2018-12-17 DIAGNOSIS — Z79890 Hormone replacement therapy (postmenopausal): Secondary | ICD-10-CM | POA: Diagnosis not present

## 2018-12-17 DIAGNOSIS — E039 Hypothyroidism, unspecified: Secondary | ICD-10-CM | POA: Insufficient documentation

## 2018-12-17 DIAGNOSIS — M5020 Other cervical disc displacement, unspecified cervical region: Secondary | ICD-10-CM | POA: Diagnosis present

## 2018-12-17 DIAGNOSIS — F17210 Nicotine dependence, cigarettes, uncomplicated: Secondary | ICD-10-CM | POA: Diagnosis not present

## 2018-12-17 DIAGNOSIS — F329 Major depressive disorder, single episode, unspecified: Secondary | ICD-10-CM | POA: Insufficient documentation

## 2018-12-17 DIAGNOSIS — I10 Essential (primary) hypertension: Secondary | ICD-10-CM | POA: Diagnosis not present

## 2018-12-17 DIAGNOSIS — K219 Gastro-esophageal reflux disease without esophagitis: Secondary | ICD-10-CM | POA: Diagnosis not present

## 2018-12-17 DIAGNOSIS — D869 Sarcoidosis, unspecified: Secondary | ICD-10-CM | POA: Insufficient documentation

## 2018-12-17 HISTORY — PX: ANTERIOR CERVICAL DECOMP/DISCECTOMY FUSION: SHX1161

## 2018-12-17 SURGERY — ANTERIOR CERVICAL DECOMPRESSION/DISCECTOMY FUSION 1 LEVEL
Anesthesia: General | Site: Spine Cervical

## 2018-12-17 MED ORDER — POLYETHYLENE GLYCOL 3350 17 G PO PACK: 17.0000 g | PACK | Freq: Every day | ORAL | Status: DC | PRN

## 2018-12-17 MED ORDER — LEVOTHYROXINE SODIUM 25 MCG PO TABS: 25.0000 ug | ORAL_TABLET | Freq: Every day | ORAL | Status: DC

## 2018-12-17 MED ORDER — FLEET ENEMA 7-19 GM/118ML RE ENEM: 1.0000 | ENEMA | Freq: Once | RECTAL | Status: DC | PRN

## 2018-12-17 MED ORDER — ONDANSETRON HCL 4 MG PO TABS: 4.0000 mg | ORAL_TABLET | Freq: Four times a day (QID) | ORAL | Status: DC | PRN

## 2018-12-17 MED ORDER — ONDANSETRON HCL 4 MG PO TABS: 4.0000 mg | ORAL_TABLET | Freq: Three times a day (TID) | ORAL | 0 refills | Status: DC | PRN

## 2018-12-17 MED ORDER — ALBUTEROL SULFATE HFA 108 (90 BASE) MCG/ACT IN AERS
INHALATION_SPRAY | RESPIRATORY_TRACT | Status: DC | PRN
  Administered 2018-12-17 (×2): 2 via RESPIRATORY_TRACT

## 2018-12-17 MED ORDER — DICYCLOMINE HCL 20 MG PO TABS
20.0000 mg | ORAL_TABLET | Freq: Three times a day (TID) | ORAL | Status: DC
  Administered 2018-12-17 – 2018-12-18 (×2): 20 mg via ORAL
  Filled 2018-12-17 (×4): qty 1

## 2018-12-17 MED ORDER — HEMOSTATIC AGENTS (NO CHARGE) OPTIME
TOPICAL | Status: DC | PRN
  Administered 2018-12-17: 1

## 2018-12-17 MED ORDER — FENTANYL CITRATE (PF) 250 MCG/5ML IJ SOLN
INTRAMUSCULAR | Status: DC | PRN
  Administered 2018-12-17 (×2): 25 ug via INTRAVENOUS
  Administered 2018-12-17: 100 ug via INTRAVENOUS

## 2018-12-17 MED ORDER — LIDOCAINE 2% (20 MG/ML) 5 ML SYRINGE
INTRAMUSCULAR | Status: AC
  Filled 2018-12-17: qty 5

## 2018-12-17 MED ORDER — SODIUM CHLORIDE 0.9% FLUSH: 3.0000 mL | INTRAVENOUS | Status: DC | PRN

## 2018-12-17 MED ORDER — ATORVASTATIN CALCIUM 10 MG PO TABS
20.0000 mg | ORAL_TABLET | Freq: Every day | ORAL | Status: DC
  Administered 2018-12-17: 20 mg via ORAL
  Filled 2018-12-17: qty 2

## 2018-12-17 MED ORDER — 0.9 % SODIUM CHLORIDE (POUR BTL) OPTIME
TOPICAL | Status: DC | PRN
  Administered 2018-12-17 (×2): 1000 mL

## 2018-12-17 MED ORDER — ONDANSETRON HCL 4 MG/2ML IJ SOLN
INTRAMUSCULAR | Status: AC
  Filled 2018-12-17: qty 2

## 2018-12-17 MED ORDER — FENTANYL CITRATE (PF) 250 MCG/5ML IJ SOLN
INTRAMUSCULAR | Status: AC
  Filled 2018-12-17: qty 5

## 2018-12-17 MED ORDER — PROPOFOL 10 MG/ML IV BOLUS
INTRAVENOUS | Status: DC | PRN
  Administered 2018-12-17: 160 mg via INTRAVENOUS

## 2018-12-17 MED ORDER — HYDROMORPHONE HCL 1 MG/ML IJ SOLN: 1.0000 mg | INTRAMUSCULAR | Status: DC | PRN

## 2018-12-17 MED ORDER — MIDAZOLAM HCL 2 MG/2ML IJ SOLN
INTRAMUSCULAR | Status: AC
  Filled 2018-12-17: qty 2

## 2018-12-17 MED ORDER — HYDROCODONE-ACETAMINOPHEN 10-325 MG PO TABS
2.0000 | ORAL_TABLET | ORAL | Status: DC | PRN
  Administered 2018-12-17 – 2018-12-18 (×4): 2 via ORAL
  Filled 2018-12-17 (×4): qty 2

## 2018-12-17 MED ORDER — SODIUM CHLORIDE 0.9% FLUSH: 3.0000 mL | Freq: Two times a day (BID) | INTRAVENOUS | Status: DC

## 2018-12-17 MED ORDER — ROCURONIUM BROMIDE 10 MG/ML (PF) SYRINGE
PREFILLED_SYRINGE | INTRAVENOUS | Status: AC
  Filled 2018-12-17: qty 10

## 2018-12-17 MED ORDER — THROMBIN 20000 UNITS EX SOLR
CUTANEOUS | Status: DC | PRN
  Administered 2018-12-17: 20 mL

## 2018-12-17 MED ORDER — PROMETHAZINE HCL 25 MG/ML IJ SOLN: 6.2500 mg | INTRAMUSCULAR | Status: DC | PRN

## 2018-12-17 MED ORDER — ONDANSETRON HCL 4 MG/2ML IJ SOLN: 4.0000 mg | Freq: Four times a day (QID) | INTRAMUSCULAR | Status: DC | PRN

## 2018-12-17 MED ORDER — DEXAMETHASONE SODIUM PHOSPHATE 10 MG/ML IJ SOLN
INTRAMUSCULAR | Status: DC | PRN
  Administered 2018-12-17: 10 mg via INTRAVENOUS

## 2018-12-17 MED ORDER — DEXAMETHASONE SODIUM PHOSPHATE 10 MG/ML IJ SOLN
INTRAMUSCULAR | Status: AC
  Filled 2018-12-17: qty 1

## 2018-12-17 MED ORDER — LEVOTHYROXINE SODIUM 200 MCG PO TABS: 200.0000 ug | ORAL_TABLET | Freq: Every day | ORAL | Status: DC

## 2018-12-17 MED ORDER — MIDAZOLAM HCL 2 MG/2ML IJ SOLN
INTRAMUSCULAR | Status: DC | PRN
  Administered 2018-12-17: 2 mg via INTRAVENOUS

## 2018-12-17 MED ORDER — BUPIVACAINE-EPINEPHRINE 0.25% -1:200000 IJ SOLN
INTRAMUSCULAR | Status: DC | PRN
  Administered 2018-12-17: 5 mL

## 2018-12-17 MED ORDER — GLYCOPYRROLATE PF 0.2 MG/ML IJ SOSY
PREFILLED_SYRINGE | INTRAMUSCULAR | Status: DC | PRN
  Administered 2018-12-17: .1 mg via INTRAVENOUS

## 2018-12-17 MED ORDER — PHENYLEPHRINE HCL (PRESSORS) 10 MG/ML IV SOLN
INTRAVENOUS | Status: DC | PRN
  Administered 2018-12-17: 40 ug via INTRAVENOUS
  Administered 2018-12-17: 80 ug via INTRAVENOUS
  Administered 2018-12-17: 120 ug via INTRAVENOUS
  Administered 2018-12-17 (×2): 80 ug via INTRAVENOUS

## 2018-12-17 MED ORDER — EPHEDRINE 5 MG/ML INJ
INTRAVENOUS | Status: AC
  Filled 2018-12-17: qty 10

## 2018-12-17 MED ORDER — GABAPENTIN 300 MG PO CAPS
300.0000 mg | ORAL_CAPSULE | Freq: Three times a day (TID) | ORAL | Status: DC
  Administered 2018-12-17 – 2018-12-18 (×3): 300 mg via ORAL
  Filled 2018-12-17 (×3): qty 1

## 2018-12-17 MED ORDER — LEVOTHYROXINE SODIUM 75 MCG PO TABS
225.0000 ug | ORAL_TABLET | Freq: Every day | ORAL | Status: DC
  Administered 2018-12-18: 225 ug via ORAL
  Filled 2018-12-17: qty 3

## 2018-12-17 MED ORDER — FENTANYL CITRATE (PF) 100 MCG/2ML IJ SOLN
25.0000 ug | INTRAMUSCULAR | Status: DC | PRN
  Administered 2018-12-17: 25 ug via INTRAVENOUS

## 2018-12-17 MED ORDER — LIDOCAINE 2% (20 MG/ML) 5 ML SYRINGE
INTRAMUSCULAR | Status: DC | PRN
  Administered 2018-12-17: 80 mg via INTRAVENOUS

## 2018-12-17 MED ORDER — ESCITALOPRAM OXALATE 10 MG PO TABS
10.0000 mg | ORAL_TABLET | Freq: Every day | ORAL | Status: DC
  Administered 2018-12-18: 09:00:00 10 mg via ORAL
  Filled 2018-12-17: qty 1

## 2018-12-17 MED ORDER — METHOCARBAMOL 500 MG PO TABS
ORAL_TABLET | ORAL | Status: AC
  Filled 2018-12-17: qty 1

## 2018-12-17 MED ORDER — HYDROCODONE-ACETAMINOPHEN 5-325 MG PO TABS: 1.0000 | ORAL_TABLET | ORAL | Status: DC | PRN

## 2018-12-17 MED ORDER — ROCURONIUM BROMIDE 50 MG/5ML IV SOSY
PREFILLED_SYRINGE | INTRAVENOUS | Status: DC | PRN
  Administered 2018-12-17: 20 mg via INTRAVENOUS
  Administered 2018-12-17: 60 mg via INTRAVENOUS

## 2018-12-17 MED ORDER — LACTATED RINGERS IV SOLN: INTRAVENOUS | Status: DC

## 2018-12-17 MED ORDER — SUGAMMADEX SODIUM 200 MG/2ML IV SOLN
INTRAVENOUS | Status: DC | PRN
  Administered 2018-12-17: 206 mg via INTRAVENOUS

## 2018-12-17 MED ORDER — ACETAMINOPHEN 650 MG RE SUPP: 650.0000 mg | RECTAL | Status: DC | PRN

## 2018-12-17 MED ORDER — SODIUM CHLORIDE 0.9 % IV SOLN: 250.0000 mL | INTRAVENOUS | Status: DC

## 2018-12-17 MED ORDER — ALPRAZOLAM 0.5 MG PO TABS: 1.0000 mg | ORAL_TABLET | Freq: Four times a day (QID) | ORAL | Status: DC | PRN

## 2018-12-17 MED ORDER — TRANEXAMIC ACID-NACL 1000-0.7 MG/100ML-% IV SOLN
INTRAVENOUS | Status: AC
  Filled 2018-12-17: qty 100

## 2018-12-17 MED ORDER — LACTATED RINGERS IV SOLN
INTRAVENOUS | Status: DC | PRN
  Administered 2018-12-17 (×2): via INTRAVENOUS

## 2018-12-17 MED ORDER — METHOCARBAMOL 500 MG PO TABS
500.0000 mg | ORAL_TABLET | Freq: Four times a day (QID) | ORAL | Status: DC | PRN
  Administered 2018-12-17 – 2018-12-18 (×2): 500 mg via ORAL
  Filled 2018-12-17: qty 1

## 2018-12-17 MED ORDER — MENTHOL 3 MG MT LOZG
1.0000 | LOZENGE | OROMUCOSAL | Status: DC | PRN
  Filled 2018-12-17 (×2): qty 9

## 2018-12-17 MED ORDER — VANCOMYCIN HCL 10 G IV SOLR
1500.0000 mg | Freq: Once | INTRAVENOUS | Status: AC
  Administered 2018-12-17: 1500 mg via INTRAVENOUS
  Filled 2018-12-17: qty 1500

## 2018-12-17 MED ORDER — ALBUTEROL SULFATE (2.5 MG/3ML) 0.083% IN NEBU: 2.5000 mg | INHALATION_SOLUTION | RESPIRATORY_TRACT | Status: DC | PRN

## 2018-12-17 MED ORDER — PHENYLEPHRINE 40 MCG/ML (10ML) SYRINGE FOR IV PUSH (FOR BLOOD PRESSURE SUPPORT)
PREFILLED_SYRINGE | INTRAVENOUS | Status: AC
  Filled 2018-12-17: qty 10

## 2018-12-17 MED ORDER — THROMBIN (RECOMBINANT) 20000 UNITS EX SOLR
CUTANEOUS | Status: AC
  Filled 2018-12-17: qty 20000

## 2018-12-17 MED ORDER — HYDROCODONE-ACETAMINOPHEN 10-325 MG PO TABS: 1.0000 | ORAL_TABLET | Freq: Four times a day (QID) | ORAL | 0 refills | Status: AC | PRN

## 2018-12-17 MED ORDER — BUPIVACAINE-EPINEPHRINE (PF) 0.25% -1:200000 IJ SOLN
INTRAMUSCULAR | Status: AC
  Filled 2018-12-17: qty 30

## 2018-12-17 MED ORDER — METHOCARBAMOL 500 MG PO TABS: 500.0000 mg | ORAL_TABLET | Freq: Three times a day (TID) | ORAL | 0 refills | Status: AC | PRN

## 2018-12-17 MED ORDER — ACETAMINOPHEN 10 MG/ML IV SOLN
INTRAVENOUS | Status: AC
  Filled 2018-12-17: qty 100

## 2018-12-17 MED ORDER — ACETAMINOPHEN 325 MG PO TABS: 650.0000 mg | ORAL_TABLET | ORAL | Status: DC | PRN

## 2018-12-17 MED ORDER — ALBUTEROL SULFATE HFA 108 (90 BASE) MCG/ACT IN AERS
INHALATION_SPRAY | RESPIRATORY_TRACT | Status: AC
  Filled 2018-12-17: qty 6.7

## 2018-12-17 MED ORDER — SODIUM CHLORIDE 0.9 % IV SOLN: INTRAVENOUS | Status: DC | PRN

## 2018-12-17 MED ORDER — PHENOL 1.4 % MT LIQD
1.0000 | OROMUCOSAL | Status: DC | PRN
  Administered 2018-12-17: 1 via OROMUCOSAL
  Filled 2018-12-17: qty 177

## 2018-12-17 MED ORDER — ALBUTEROL SULFATE HFA 108 (90 BASE) MCG/ACT IN AERS: 2.0000 | INHALATION_SPRAY | RESPIRATORY_TRACT | Status: DC | PRN

## 2018-12-17 MED ORDER — LACTATED RINGERS IV SOLN
INTRAVENOUS | Status: DC
  Administered 2018-12-17: 12:00:00 via INTRAVENOUS

## 2018-12-17 MED ORDER — METHOCARBAMOL 1000 MG/10ML IJ SOLN
500.0000 mg | Freq: Four times a day (QID) | INTRAVENOUS | Status: DC | PRN
  Filled 2018-12-17: qty 5

## 2018-12-17 MED ORDER — QUETIAPINE FUMARATE 300 MG PO TABS
300.0000 mg | ORAL_TABLET | Freq: Every day | ORAL | Status: DC
  Filled 2018-12-17: qty 1

## 2018-12-17 MED ORDER — TRANEXAMIC ACID-NACL 1000-0.7 MG/100ML-% IV SOLN
INTRAVENOUS | Status: DC | PRN
  Administered 2018-12-17: 1000 mg via INTRAVENOUS

## 2018-12-17 MED ORDER — EPHEDRINE SULFATE 50 MG/ML IJ SOLN
INTRAMUSCULAR | Status: DC | PRN
  Administered 2018-12-17: 5 mg via INTRAVENOUS
  Administered 2018-12-17: 10 mg via INTRAVENOUS
  Administered 2018-12-17: 5 mg via INTRAVENOUS

## 2018-12-17 MED ORDER — ONDANSETRON HCL 4 MG/2ML IJ SOLN
INTRAMUSCULAR | Status: DC | PRN
  Administered 2018-12-17: 4 mg via INTRAVENOUS

## 2018-12-17 MED ORDER — ROPINIROLE HCL 1 MG PO TABS
4.0000 mg | ORAL_TABLET | Freq: Every day | ORAL | Status: DC
  Administered 2018-12-17: 4 mg via ORAL
  Filled 2018-12-17: qty 4

## 2018-12-17 MED ORDER — FENTANYL CITRATE (PF) 100 MCG/2ML IJ SOLN
INTRAMUSCULAR | Status: AC
  Filled 2018-12-17: qty 2

## 2018-12-17 SURGICAL SUPPLY — 61 items
AGENT HMST KT MTR STRL THRMB (HEMOSTASIS) ×1
BONE VIVIGEN FORMABLE 1.3CC (Bone Implant) ×2 IMPLANT
CABLE BIPOLOR RESECTION CORD (MISCELLANEOUS) ×2 IMPLANT
CANISTER SUCT 3000ML PPV (MISCELLANEOUS) ×2 IMPLANT
CLSR STERI-STRIP ANTIMIC 1/2X4 (GAUZE/BANDAGES/DRESSINGS) ×2 IMPLANT
COVER MAYO STAND STRL (DRAPES) ×6 IMPLANT
COVER SURGICAL LIGHT HANDLE (MISCELLANEOUS) ×3 IMPLANT
COVER WAND RF STERILE (DRAPES) ×2 IMPLANT
DEVICE EDNSKLTN TC NNLCK MED 8 (Cage) IMPLANT
DRAPE C-ARM 42X72 X-RAY (DRAPES) ×2 IMPLANT
DRAPE POUCH INSTRU U-SHP 10X18 (DRAPES) ×2 IMPLANT
DRAPE SURG 17X23 STRL (DRAPES) ×2 IMPLANT
DRAPE U-SHAPE 47X51 STRL (DRAPES) ×2 IMPLANT
DRSG OPSITE POSTOP 3X4 (GAUZE/BANDAGES/DRESSINGS) ×2 IMPLANT
DRSG OPSITE POSTOP 4X6 (GAUZE/BANDAGES/DRESSINGS) ×1 IMPLANT
DURAPREP 26ML APPLICATOR (WOUND CARE) ×2 IMPLANT
ELECT COATED BLADE 2.86 ST (ELECTRODE) ×2 IMPLANT
ELECT PENCIL ROCKER SW 15FT (MISCELLANEOUS) ×2 IMPLANT
ELECT REM PT RETURN 9FT ADLT (ELECTROSURGICAL) ×2
ELECTRODE REM PT RTRN 9FT ADLT (ELECTROSURGICAL) ×1 IMPLANT
ENDOSKELTON IMPLANT TC MED 8MM (Cage) ×2 IMPLANT
GLOVE BIO SURGEON STRL SZ 6.5 (GLOVE) ×2 IMPLANT
GLOVE BIOGEL PI IND STRL 6.5 (GLOVE) ×1 IMPLANT
GLOVE BIOGEL PI IND STRL 8.5 (GLOVE) ×1 IMPLANT
GLOVE BIOGEL PI INDICATOR 6.5 (GLOVE) ×1
GLOVE BIOGEL PI INDICATOR 8.5 (GLOVE) ×1
GLOVE SS BIOGEL STRL SZ 8.5 (GLOVE) ×1 IMPLANT
GLOVE SUPERSENSE BIOGEL SZ 8.5 (GLOVE) ×2
GOWN STRL REUS W/ TWL LRG LVL3 (GOWN DISPOSABLE) ×1 IMPLANT
GOWN STRL REUS W/TWL 2XL LVL3 (GOWN DISPOSABLE) ×2 IMPLANT
GOWN STRL REUS W/TWL LRG LVL3 (GOWN DISPOSABLE) ×2
GRAFT BNE MATRIX VG FRMBL SM 1 (Bone Implant) IMPLANT
KIT BASIN OR (CUSTOM PROCEDURE TRAY) ×2 IMPLANT
KIT TURNOVER KIT B (KITS) ×2 IMPLANT
NDL SPNL 18GX3.5 QUINCKE PK (NEEDLE) ×1 IMPLANT
NEEDLE HYPO 22GX1.5 SAFETY (NEEDLE) ×2 IMPLANT
NEEDLE SPNL 18GX3.5 QUINCKE PK (NEEDLE) ×2 IMPLANT
NS IRRIG 1000ML POUR BTL (IV SOLUTION) ×2 IMPLANT
PACK ORTHO CERVICAL (CUSTOM PROCEDURE TRAY) ×2 IMPLANT
PACK UNIVERSAL I (CUSTOM PROCEDURE TRAY) ×2 IMPLANT
PAD ARMBOARD 7.5X6 YLW CONV (MISCELLANEOUS) ×4 IMPLANT
PIN DISTRACTION 14 (PIN) ×2 IMPLANT
PLATE SKYLINE 12MM (Plate) ×1 IMPLANT
POSITIONER HEAD DONUT 9IN (MISCELLANEOUS) ×2 IMPLANT
RESTRAINT LIMB HOLDER UNIV (RESTRAINTS) ×2 IMPLANT
SCREW SKYLINE 14MM SD-VA (Screw) ×4 IMPLANT
SPONGE INTESTINAL PEANUT (DISPOSABLE) ×4 IMPLANT
SPONGE LAP 4X18 RFD (DISPOSABLE) ×1 IMPLANT
SPONGE SURGIFOAM ABS GEL SZ50 (HEMOSTASIS) ×2 IMPLANT
SURGIFLO W/THROMBIN 8M KIT (HEMOSTASIS) ×1 IMPLANT
SUT BONE WAX W31G (SUTURE) ×2 IMPLANT
SUT MON AB 3-0 SH 27 (SUTURE) ×2
SUT MON AB 3-0 SH27 (SUTURE) ×1 IMPLANT
SUT VIC AB 2-0 CT1 18 (SUTURE) ×2 IMPLANT
SYR BULB IRRIGATION 50ML (SYRINGE) ×2 IMPLANT
SYR CONTROL 10ML LL (SYRINGE) ×2 IMPLANT
TAPE CLOTH 4X10 WHT NS (GAUZE/BANDAGES/DRESSINGS) ×2 IMPLANT
TAPE UMBILICAL COTTON 1/8X30 (MISCELLANEOUS) ×2 IMPLANT
TOWEL GREEN STERILE (TOWEL DISPOSABLE) ×2 IMPLANT
TOWEL GREEN STERILE FF (TOWEL DISPOSABLE) ×2 IMPLANT
WATER STERILE IRR 1000ML POUR (IV SOLUTION) ×2 IMPLANT

## 2018-12-17 NOTE — Transfer of Care (Signed)
Immediate Anesthesia Transfer of Care Note  Patient: Matina Hohnstein  Procedure(s) Performed: ANTERIOR CERVICAL DECOMPRESSION/DISCECTOMY FUSION CERVIAL FIVE THROUGH SIX (N/A Spine Cervical)  Patient Location: PACU  Anesthesia Type:General  Level of Consciousness: drowsy and patient cooperative  Airway & Oxygen Therapy: Patient Spontanous Breathing and Patient connected to face mask oxygen  Post-op Assessment: Report given to RN and Post -op Vital signs reviewed and stable  Post vital signs: Reviewed and stable  Last Vitals:  Vitals Value Taken Time  BP 162/82 12/17/18 1529  Temp    Pulse 88 12/17/18 1531  Resp 21 12/17/18 1531  SpO2 95 % 12/17/18 1531  Vitals shown include unvalidated device data.  Last Pain:  Vitals:   12/17/18 1130  TempSrc:   PainSc: 8       Patients Stated Pain Goal: 2 ([id number])  Complications: No apparent anesthesia complications

## 2018-12-17 NOTE — Anesthesia Postprocedure Evaluation (Signed)
Anesthesia Post Note  Patient: Katherine Hayden  Procedure(s) Performed: ANTERIOR CERVICAL DECOMPRESSION/DISCECTOMY FUSION CERVIAL FIVE THROUGH SIX (N/A Spine Cervical)     Patient location during evaluation: PACU Anesthesia Type: General Level of consciousness: awake and alert Pain management: pain level controlled Vital Signs Assessment: post-procedure vital signs reviewed and stable Respiratory status: spontaneous breathing, nonlabored ventilation, respiratory function stable and patient connected to nasal cannula oxygen Cardiovascular status: blood pressure returned to baseline and stable Postop Assessment: no apparent nausea or vomiting Anesthetic complications: no    Last Vitals:  Vitals:   12/17/18 1600 12/17/18 1615  BP: 123/70 134/84  Pulse: 79 86  Resp: 17 20  Temp:  36.6 C  SpO2: 93% 94%    Last Pain:  Vitals:   12/17/18 1600  TempSrc:   PainSc: [place] Harvy Riera

## 2018-12-17 NOTE — Anesthesia Procedure Notes (Signed)
Procedure Name: Intubation Date/Time: 12/17/2018 1:01 PM Performed by: Kathryne Hitch, CRNA Pre-anesthesia Checklist: Patient identified, Emergency Drugs available, Suction available and Patient being monitored Patient Re-evaluated:Patient Re-evaluated prior to induction Oxygen Delivery Method: Circle system utilized Preoxygenation: Pre-oxygenation with 100% oxygen Induction Type: IV induction Ventilation: Mask ventilation without difficulty Laryngoscope Size: Glidescope and 4 Grade View: Grade I Tube type: Oral Number of attempts: 1 Airway Equipment and Method: Stylet and Oral airway Placement Confirmation: ETT inserted through vocal cords under direct vision,  positive ETCO2 and breath sounds checked- equal and bilateral Secured at: 22 cm Tube secured with: Tape Dental Injury: Teeth and Oropharynx as per pre-operative assessment  Difficulty Due To: Difficult Airway- due to large tongue and Difficult Airway- due to anterior larynx

## 2018-12-17 NOTE — Op Note (Signed)
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  Postoperative diagnosis: Same  Operative procedure: Anterior cervical discectomy and fusion 0000000  Complications: None  First Assistant: Cleta Alberts, PA  Implants: Medtronic/Titan Nano lock intervertebral spacer.  8 mm medium.  Depey anterior skyline cervical plate 12 mm length.  14 mm locking screws.  Allograft: vivogen  Indications: Katherine Hayden is a very pleasant 53 year old female with significant neck and neuropathic arm pain.  Clinical exam confirmed a C6 radiculopathy with cervical spondylitic disease.  As result of persistent neuropathic pain and the failure to improve with conservative measures we elected to move forward with surgery.  All appropriate risks benefits and alternatives were discussed with the patient and consent was obtained.  Operative report: Patient is brought the operating room placed upon the operating room table.  After successful induction of general anesthesia and endotracheal intubation teds SCDs were applied and the anterior cervical spine was prepped and draped in a standard fashion.  Timeout was taken to confirm patient procedure and all other important data.  Using lateral fluoroscopy traction was applied to the arms and we visualized the C5-6 disc space.  The incision site was marked on the skin and then infiltrated with quarter percent Marcaine with epinephrine.  Transverse incision was made and sharp dissection was carried down to the platysma.  The platysma was isolated and separated and I performed a standard Smith-Robinson approach to the cervical spine.  I began my deep dissection along the medial border the sternocleidomastoid sweeping the omohyoid muscle and the esophagus and trachea to the right.  I then bluntly dissected through the main deep cervical and prevertebral fascia until I could palpate the anterior cervical spine.  Retractors were placed and I  used Kitner dissectors to continue to expose the C5-6 disc space.  A needle was placed into the disc space and a second x-ray was taken confirming that I was at the appropriate level.  Using bipolar cautery I mobilized the longus coli muscle from the superior aspect of C5 to the inferior aspect of C6.  I then used a double-action Leksell rongeurs to remove the anterior exostosis at the C5-6 disc space level.  Caspar self-retaining tractors were placed underneath the longus coli muscle and the endotracheal cuff was deflated.  The retractors were expanded to the appropriate width and the endotracheal cuff was reinflated.  Annulotomy was performed with a 15 blade scalpel I used pituitary rongeurs to remove the bulk of the disc material.  2 and 3 mm Kerrison Roger was used to remove the overhanging osteophyte from the inferior aspect of the C5 vertebral body.  At this point distraction pins were placed into the body of C5 and C6 and I distracted the intervertebral space and maintained it with the distraction pins.  Using curettes I remove the remainder of the disc material until I could see the posterior annulus.  Using a fine nerve hook I developed a plane underneath the posterior annulus and then used my 1 mm Kerrison Roger to resect the posterior annulus and trimmed down the osteophyte from the posterior aspect of the vertebral bodies of C5 and C6.  I then dissected under the posterior longitudinal ligament and resected this with a 1 mm Kerrison Roger I then undercut the uncovertebral joint on the right-hand side to adequately decompress the nerve.  There were hard disc osteophyte consistent with preoperative imaging studies.  At this point with the decompression complete I used my trial spacers and  elected to use a size 8 medium lordotic spacer.  I made sure I had bleeding subchondral bone and then I packed the graft and malleted to the appropriate depth.  I then applied the anterior cervical plate and secured  it with 14 mm self drilling screws.  All 4 screws had excellent purchase.  Once they were secured down I used the final locking device to lock the screws to the plate.  At this point I then irrigated wound copiously normal saline and using FloSeal and bipolar cautery I obtained and maintain hemostasis.  The Caspar retractors were removed as were the distraction pins and all bleeding was controlled.  I returned the esophagus and trachea to midline and took my final x-rays.  In both the AP and lateral planes I noted satisfactory decompression of the uncovertebral joint as well and restoration of intervertebral height.  At this point with hemostasis achieved achieved and the fixation in place I then reapproximated the platysma with interrupted 2-0 Vicryl suture.  The skin was reapproximated with 3-0 Monocryl Steri-Strips dry dressings were applied and the patient was ultimately extubated transferred to PACU without incident.  The end of the case all needle sponge counts were correct.

## 2018-12-17 NOTE — Anesthesia Preprocedure Evaluation (Addendum)
Anesthesia Evaluation  Patient identified by MRN, date of birth, ID band Patient awake    Reviewed: Allergy & Precautions, NPO status , Patient's Chart, lab work & pertinent test results  History of Anesthesia Complications Negative for: history of anesthetic complications  Airway Mallampati: II  TM Distance: >3 FB Neck ROM: Limited    Dental  (+) Dental Advisory Given, Missing   Pulmonary asthma , COPD,  COPD inhaler, Current SmokerPatient did not abstain from smoking.,    Pulmonary exam normal        Cardiovascular hypertension, Normal cardiovascular exam     Neuro/Psych  Headaches, PSYCHIATRIC DISORDERS Anxiety Depression    GI/Hepatic Neg liver ROS, GERD  Medicated and Controlled,  Endo/Other  Hypothyroidism  Obesity   Renal/GU negative Renal ROS     Musculoskeletal  (+) Arthritis ,   Abdominal   Peds  Hematology negative hematology ROS (+)   Anesthesia Other Findings   Reproductive/Obstetrics                            Anesthesia Physical Anesthesia Plan  ASA: III  Anesthesia Plan: General   Post-op Pain Management:    Induction: Intravenous  PONV Risk Score and Plan: 4 or greater and Ondansetron, Midazolam, Dexamethasone and Treatment may vary due to age or medical condition  Airway Management Planned: Oral ETT and Video Laryngoscope Planned  Additional Equipment: None  Intra-op Plan:   Post-operative Plan: Extubation in OR  Informed Consent: I have reviewed the patients History and Physical, chart, labs and discussed the procedure including the risks, benefits and alternatives for the proposed anesthesia with the patient or authorized representative who has indicated his/her understanding and acceptance.     Dental advisory given  Plan Discussed with: CRNA and Anesthesiologist  Anesthesia Plan Comments:        Anesthesia Quick Evaluation

## 2018-12-17 NOTE — Discharge Instructions (Signed)

## 2018-12-17 NOTE — H&P (Signed)
Addendum H&P  No change in patient's clinical exam from last office visit of 12/12/2018.  She continues to have significant cervical radiculopathy on the right side secondary to C6 nerve compression.  Despite appropriate conservative care she is failed to improve and has expressed a desire to move forward with surgery.  I have again gone over the all appropriate risks benefits and alternatives to surgery and consent has been signed.  Plan on moving forward with single level ACDF today.

## 2018-12-17 NOTE — Brief Op Note (Signed)
12/17/2018  3:14 PM  PATIENT:  Katherine Hayden  53 y.o. female  PRE-OPERATIVE DIAGNOSIS:  C5-6 herniated disc with right C6 radiculopathy  POST-OPERATIVE DIAGNOSIS:  C5-6 herniated disc with right C6 radiculopathy  PROCEDURE:  Procedure(s) with comments: ANTERIOR CERVICAL DECOMPRESSION/DISCECTOMY FUSION CERVIAL FIVE THROUGH SIX (N/A) - 2.5 hrs  SURGEON:  Surgeon(s) and Role:    Melina Schools, MD - Primary  PHYSICIAN ASSISTANT:   ASSISTANTS: amanda ward, PA   ANESTHESIA:   general  EBL:  250 mL   BLOOD ADMINISTERED:none  DRAINS: none   LOCAL MEDICATIONS USED:  MARCAINE     SPECIMEN:  No Specimen  DISPOSITION OF SPECIMEN:  N/A  COUNTS:  YES  TOURNIQUET:  * No tourniquets in log *  DICTATION: .Dragon Dictation  PLAN OF CARE: Admit for overnight observation  PATIENT DISPOSITION:  PACU - hemodynamically stable.

## 2018-12-18 DIAGNOSIS — M4722 Other spondylosis with radiculopathy, cervical region: Secondary | ICD-10-CM | POA: Diagnosis not present

## 2018-12-18 DIAGNOSIS — D869 Sarcoidosis, unspecified: Secondary | ICD-10-CM | POA: Diagnosis not present

## 2018-12-18 DIAGNOSIS — E039 Hypothyroidism, unspecified: Secondary | ICD-10-CM | POA: Diagnosis not present

## 2018-12-18 DIAGNOSIS — J439 Emphysema, unspecified: Secondary | ICD-10-CM | POA: Diagnosis not present

## 2018-12-18 DIAGNOSIS — Z79899 Other long term (current) drug therapy: Secondary | ICD-10-CM | POA: Diagnosis not present

## 2018-12-18 DIAGNOSIS — K219 Gastro-esophageal reflux disease without esophagitis: Secondary | ICD-10-CM | POA: Diagnosis not present

## 2018-12-18 DIAGNOSIS — I10 Essential (primary) hypertension: Secondary | ICD-10-CM | POA: Diagnosis not present

## 2018-12-18 DIAGNOSIS — M5020 Other cervical disc displacement, unspecified cervical region: Secondary | ICD-10-CM | POA: Diagnosis not present

## 2018-12-18 DIAGNOSIS — M50122 Cervical disc disorder at C5-C6 level with radiculopathy: Secondary | ICD-10-CM | POA: Diagnosis not present

## 2018-12-18 MED FILL — Thrombin (Recombinant) For Soln 20000 Unit: CUTANEOUS | Qty: 1 | Status: AC

## 2018-12-18 NOTE — Progress Notes (Signed)
    Subjective: Procedure(s) (LRB): ANTERIOR CERVICAL DECOMPRESSION/DISCECTOMY FUSION CERVIAL FIVE THROUGH SIX (N/A) 1 Day Post-Op  Patient reports pain as 2 on 0-10 scale.  Reports decreased arm pain reports incisional neck pain   Positive void Negative bowel movement Positive flatus Negative chest pain or shortness of breath  Objective: Vital signs in last 24 hours: Temp:  [97.7 F (36.5 C)-98.6 F (37 C)] 98.1 F (36.7 C) (09/10 0740) Pulse Rate:  [75-91] 84 (09/10 0740) Resp:  [16-20] 16 (09/10 0740) BP: (123-162)/(70-88) 131/80 (09/10 0740) SpO2:  [93 %-99 %] 94 % (09/10 0740) Weight:  [103 kg] 103 kg (09/09 1030)  Intake/Output from previous day: 09/09 0701 - 09/10 0700 In: 1400 [I.V.:1400] Out: 250 [Blood:250]  Labs: Recent Labs    12/16/18 1025  WBC 9.9  RBC 4.28  HCT 41.5  PLT 235   Recent Labs    12/16/18 1025  NA 141  K 3.5  CL 106  CO2 28  BUN 8  CREATININE 0.69  GLUCOSE 89  CALCIUM 8.7*   Recent Labs    12/16/18 1025  INR 1.0    Physical Exam: Neurologically intact ABD soft Intact pulses distally Incision: dressing C/D/I Compartment soft Body mass index is 37.2 kg/m.  Assessment/Plan: Patient stable  xrays n/a Mobilization with physical therapy Encourage incentive spirometry Continue care  Advance diet Up with therapy Plan for discharge tomorrow  Patient complains of headaches but it is improving   Melina Schools, MD [facility] [phone]

## 2018-12-18 NOTE — Evaluation (Addendum)
Occupational Therapy Evaluation Patient Details Name: Katherine Hayden MRN: [id number] DOB: 05-03-1965 Today's Date: 12/18/2018    History of Present Illness 53 yo admitted for C5-6 ACDF. PMHx: obesity, smoker, HTN, GERD, sarcoidosis, asthma   Clinical Impression   Pt PTA: living alone with supportive family available. Pt currently limited by pain in neck and chronic headache. Pt drowsy this AM and unsure of carryover skills, but no AE required. BSC for bathroom due to low commode recommended. Pt shown AE for LB dressing/bathing; but pt able to perform LB dressing with figure 4 technique. Pt performing ADL tasks in standing and at sink with modified independence. Pt drowsy throughout session- unsure of carryover skills. Back handout provided and reviewed adls in detail. Pt educated on: clothing between brace, never sleep in brace, set an alarm at night for medication, avoid sitting for long periods of time, correct bed positioning for sleeping, correct sequence for bed mobility, avoiding lifting more than 5 pounds and never wash directly over incision. All education is complete and patient indicates understanding. Pt would greatly benefit from continued OT skilled services for ADL,mobiltiy and safety. OT following acutely.      Follow Up Recommendations  Home health OT;Supervision - Intermittent    Equipment Recommendations  3 in 1 bedside commode((if she decided that she needs it))    Recommendations for Other Services       Precautions / Restrictions Precautions Precautions: Cervical;Fall Precaution Booklet Issued: Yes (comment) Precaution Comments: verbal discussion of handout Required Braces or Orthoses: Cervical Brace Cervical Brace: At all times Restrictions Weight Bearing Restrictions: No      Mobility Bed Mobility Overal bed mobility: Modified Independent                Transfers Overall transfer level: Needs assistance   Transfers: Sit to/from Stand Sit  to Stand: Supervision         General transfer comment: supervision for safety with unsteady gait    Balance Overall balance assessment: Mild deficits observed, not formally tested                                         ADL either performed or assessed with clinical judgement   ADL Overall ADL's : At baseline                                       General ADL Comments: Pt shown AE for LB dressing/bathing; but pt able to perform LB dressing with figure 4 technique. Pt performing ADL tasks in standing and at sink with modified independence. Pt drowsy throughout session- unsure of carryover skills.     Vision Baseline Vision/History: Wears glasses Wears Glasses: At all times Patient Visual Report: No change from baseline Vision Assessment?: No apparent visual deficits     Perception     Praxis      Pertinent Vitals/Pain Pain Assessment: 0-10 Pain Score: 3  Pain Location: headache Pain Descriptors / Indicators: Aching Pain Intervention(s): Limited activity within patient's tolerance     Hand Dominance Right   Extremity/Trunk Assessment Upper Extremity Assessment Upper Extremity Assessment: Generalized weakness;RUE deficits/detail RUE Deficits / Details: appears to be functional, no deficits noted.   Lower Extremity Assessment Lower Extremity Assessment: Defer to PT evaluation;Overall [facility] for tasks assessed   Cervical / Trunk  Assessment Cervical / Trunk Assessment: Other exceptions Cervical / Trunk Exceptions: s/p neck sx   Communication Communication Communication: No difficulties   Cognition Arousal/Alertness: Awake/alert Behavior During Therapy: WFL for tasks assessed/performed Overall Cognitive Status: Impaired/Different from baseline Area of Impairment: Problem solving                             Problem Solving: Slow processing General Comments: Pt drowsy throughout session, saying very little and requiring  cues to attend to tasks.   General Comments       Exercises     Shoulder Instructions      Home Living Family/patient expects to be discharged to:: Private residence Living Arrangements: Alone Available Help at Discharge: Family;Available PRN/intermittently Type of Home: Mobile home Home Access: Stairs to enter Entrance Stairs-Number of Steps: 4 Entrance Stairs-Rails: Right Home Layout: One level     Bathroom Shower/Tub: Teacher, early years/pre: Standard     Home Equipment: None          Prior Functioning/Environment Level of Independence: Independent                 OT Problem List: Decreased strength;Decreased activity tolerance;Impaired balance (sitting and/or standing);Decreased safety awareness;Pain      OT Treatment/Interventions: Self-care/ADL training;Therapeutic exercise;Energy conservation;Therapeutic activities;Balance training;Patient/family education    OT Goals(Current goals can be found in the care plan section) Acute Rehab OT Goals Patient Stated Goal: return home OT Goal Formulation: With patient Time For Goal Achievement: 01/01/19 Potential to Achieve Goals: Good ADL Goals Pt Will Perform Lower Body Dressing: with modified independence;sit to/from stand;sitting/lateral leans Additional ADL Goal #1: Pt will increase to modifed independence for ADL OOB adhering to cervical precautions  OT Frequency: Min 2X/week   Barriers to D/C:            Co-evaluation              AM-PAC OT "6 Clicks" Daily Activity     Outcome Measure Help from another person eating meals?: None Help from another person taking care of personal grooming?: None Help from another person toileting, which includes using toliet, bedpan, or urinal?: None Help from another person bathing (including washing, rinsing, drying)?: A Little Help from another person to put on and taking off regular upper body clothing?: None Help from another person to put on  and taking off regular lower body clothing?: A Little 6 Click Score: 22   End of Session Equipment Utilized During Treatment: Cervical collar Nurse Communication: Patient requests pain meds;Mobility status  Activity Tolerance: Patient limited by lethargy Patient left: in chair;with call bell/phone within reach  OT Visit Diagnosis: Unsteadiness on feet (R26.81);Muscle weakness (generalized) (M62.81)                Time: [id number] OT Time Calculation (min): 24 min Charges:  OT General Charges $OT Visit: 1 Visit OT Evaluation $OT Eval Moderate Complexity: 1 Mod OT Treatments $Self Care/Home Management : 8-22 mins  Ebony Hail Harold Hedge) Marsa Aris OTR/L Acute Rehabilitation Services Pager: [phone] Office: [phone] 12/18/2018, 11:00 AM

## 2018-12-18 NOTE — Plan of Care (Signed)
Patient alert and oriented, mae's well, voiding adequate amount of urine, swallowing without difficulty, no c/o pain at time of discharge. Patient discharged home with family. Script and discharged instructions given to patient. Patient and family stated understanding of instructions given. Patient has an appointment with Dr. Brooks  

## 2018-12-18 NOTE — Evaluation (Signed)
Physical Therapy Evaluation Patient Details Name: Katherine Hayden MRN: [id number] DOB: 13-Feb-1966 Today's Date: 12/18/2018   History of Present Illness  (P) 53 yo admitted for C5-6 ACDF. PMHx: obesity, smoker, HTN, GERD, sarcoidosis, asthma  Clinical Impression  Pt sitting in chair without collar on arrival. Pt required min assist to don collar correctly and limited by fatigue and drowsiness throughout session. Pt with unsteady gait and decreased activity tolerance who will benefit from acute therapy to maximize mobility, function and balance to return to independence prior to D/C. Encouraged initial supervision/assist for mobility until pt with decreased drowsiness/stability. PT educated for precautions, collar wear, transfers and safety.     Follow Up Recommendations Supervision for mobility/OOB    Equipment Recommendations  None recommended by PT    Recommendations for Other Services       Precautions / Restrictions Precautions Precautions: (P) Cervical;Fall Precaution Booklet Issued: (P) Yes (comment) Precaution Comments: (P) verbal discussion of handout Required Braces or Orthoses: (P) Cervical Brace Cervical Brace: (P) At all times Restrictions Weight Bearing Restrictions: (P) No      Mobility  Bed Mobility Overal bed mobility: Modified Independent                Transfers Overall transfer level: Needs assistance   Transfers: Sit to/from Stand Sit to Stand: Supervision         General transfer comment: supervision for safety with unsteady gait  Ambulation/Gait Ambulation/Gait assistance: Min guard Gait Distance (Feet): 200 Feet Assistive device: 1 person hand held assist Gait Pattern/deviations: Step-through pattern;Decreased stride length   Gait velocity interpretation: 1.31 - 2.62 ft/sec, indicative of limited community ambulator General Gait Details: pt drowsy with assist for balance and stability, pt reaching out for environmental support to  steady  Stairs Stairs: Yes Stairs assistance: Supervision Stair Management: One rail Right;Step to pattern;Sideways Number of Stairs: 4 General stair comments: cues for sequence with use of rail on right  Wheelchair Mobility    Modified Rankin (Stroke Patients Only)       Balance Overall balance assessment: Mild deficits observed, not formally tested                                           Pertinent Vitals/Pain Pain Assessment: 0-10 Pain Score: 2  Pain Location: neck Pain Descriptors / Indicators: Aching Pain Intervention(s): Limited activity within patient's tolerance;Premedicated before session;Monitored during session;Repositioned    Home Living Family/patient expects to be discharged to:: (P) Private residence Living Arrangements: (P) Alone Available Help at Discharge: (P) Family;Available PRN/intermittently Type of Home: (P) Mobile home Home Access: (P) Stairs to enter Entrance Stairs-Rails: Right   Home Layout: One level Home Equipment: None      Prior Function Level of Independence: Independent               Hand Dominance        Extremity/Trunk Assessment   Upper Extremity Assessment Upper Extremity Assessment: Defer to OT evaluation    Lower Extremity Assessment Lower Extremity Assessment: Overall WFL for tasks assessed    Cervical / Trunk Assessment Cervical / Trunk Assessment: (post surgical guarding)  Communication   Communication: No difficulties  Cognition Arousal/Alertness: Awake/alert Behavior During Therapy: WFL for tasks assessed/performed Overall Cognitive Status: Impaired/Different from baseline Area of Impairment: Problem solving  Problem Solving: Slow processing General Comments: pt with decreased ability to maintain eyes open and drowsy, suspect medication related      General Comments      Exercises     Assessment/Plan    PT Assessment Patient needs  continued PT services  PT Problem List Decreased balance;Decreased activity tolerance;Decreased mobility       PT Treatment Interventions Gait training;Stair training;Therapeutic activities;Functional mobility training;DME instruction;Balance training;Patient/family education    PT Goals (Current goals can be found in the Care Plan section)  Acute Rehab PT Goals Patient Stated Goal: return home PT Goal Formulation: With patient Time For Goal Achievement: 12/25/18 Potential to Achieve Goals: Good    Frequency Min 4X/week   Barriers to discharge Decreased caregiver support      Co-evaluation               AM-PAC PT "6 Clicks" Mobility  Outcome Measure Help needed turning from your back to your side while in a flat bed without using bedrails?: None Help needed moving from lying on your back to sitting on the side of a flat bed without using bedrails?: None Help needed moving to and from a bed to a chair (including a wheelchair)?: None Help needed standing up from a chair using your arms (e.g., wheelchair or bedside chair)?: None Help needed to walk in hospital room?: A Little Help needed climbing 3-5 steps with a railing? : A Little 6 Click Score: 22    End of Session   Activity Tolerance: Patient tolerated treatment well Patient left: in bed;with call bell/phone within reach Nurse Communication: Precautions PT Visit Diagnosis: Other abnormalities of gait and mobility (R26.89);Unsteadiness on feet (R26.81)    Time: [id number] PT Time Calculation (min) (ACUTE ONLY): 15 min   Charges:   PT Evaluation $PT Eval Moderate Complexity: [place], PT Acute Rehabilitation Services Pager: [phone] Office: [phone]   Sandy Salaam Ceazia Harb 12/18/2018, 10:42 AM

## 2018-12-19 ENCOUNTER — Encounter (HOSPITAL_COMMUNITY): Payer: Self-pay | Admitting: Orthopedic Surgery

## 2018-12-19 NOTE — Discharge Summary (Signed)
[name] MRN: [id number] DOB/AGE: [birth year] 53 y.o.  Admit date: 12/17/2018 Discharge date: 12/19/2018  Admission Diagnoses:  Active Problems:   Cervical disc herniation   Discharge Diagnoses:  Active Problems:   Cervical disc herniation  status post Procedure(s): ANTERIOR CERVICAL DECOMPRESSION/DISCECTOMY FUSION CERVIAL FIVE THROUGH SIX  Past Medical History:  Diagnosis Date  . Anxiety   . Arthritis   . Asthma   . Cervicogenic headache 08/20/2016  . Chronic headaches   . Chronic pain   . Depression   . Emphysema   . Emphysema of lung ([place])   . GERD (gastroesophageal reflux disease)   . Hypertension   . Hypothyroidism   . Thyroid disease     Surgeries: Procedure(s): ANTERIOR CERVICAL DECOMPRESSION/DISCECTOMY FUSION CERVIAL FIVE THROUGH SIX on 12/17/2018   Consultants:   Discharged Condition: Improved  Hospital Course: Katherine Hayden is an 53 y.o. female who was admitted 12/17/2018 for operative treatment of C5-6 herniated disc with right C6 radiculopathy. Patient failed conservative treatments (please see the history and physical for the specifics) and had severe unremitting pain that affects sleep, daily activities and work/hobbies. After pre-op clearance, the patient was taken to the operating room on 12/17/2018 and underwent  Procedure(s): ANTERIOR CERVICAL DECOMPRESSION/DISCECTOMY FUSION CERVIAL FIVE THROUGH SIX.    Patient was given perioperative antibiotics:  Anti-infectives (From admission, onward)   Start     Dose/Rate Route Frequency Ordered Stop   12/17/18 2330  vancomycin (VANCOCIN) 1,500 mg in sodium chloride 0.9 % 500 mL IVPB     1,500 mg 250 mL/hr over 120 Minutes Intravenous  Once 12/17/18 1651 12/18/18 0108   12/17/18 1100  vancomycin (VANCOCIN) IVPB 1000 mg/200 mL premix     1,000 mg 200 mL/hr over 60 Minutes Intravenous To Short Stay 12/16/18 0851 12/17/18 1257       Patient was given sequential compression devices  and early ambulation to prevent DVT.   Patient benefited maximally from hospital stay and there were no complications. At the time of discharge, the patient was urinating/moving their bowels without difficulty, tolerating a regular diet, pain is controlled with oral pain medications and they have been cleared by PT/OT.   Recent vital signs: No data found.   Recent laboratory studies:  Recent Labs    12/16/18 1025  WBC 9.9  HGB 13.1  HCT 41.5  PLT 235  NA 141  K 3.5  CL 106  CO2 28  BUN 8  CREATININE 0.69  GLUCOSE 89  INR 1.0  CALCIUM 8.7*     Discharge Medications:   Allergies as of 12/18/2018      Reactions   Pneumococcal Vaccines Itching, Other (See Comments)   Patient received Influenza and Pneumococcal vaccines at the same time-caused severe itching and needs medication to reverse reaction, this caused her to go into cardiac arrest at that time.     Amoxicillin Hives   Codeine Nausea And Vomiting   Hydromorphone Hcl Nausea And Vomiting   Sulfonamide Derivatives Hives   Morphine And Related Hives      Medication List    STOP taking these medications   Apple Cider Vinegar 500 MG Tabs   baclofen 10 MG tablet Commonly known as: LIORESAL   ibuprofen 200 MG tablet Commonly known as: ADVIL     TAKE these medications   ALPRAZolam 1 MG tablet Commonly known as: XANAX Take 1 mg by mouth 4 (four) times daily as needed for anxiety.   atorvastatin 20 MG  tablet Commonly known as: LIPITOR Take 20 mg by mouth at bedtime.   dicyclomine 20 MG tablet Commonly known as: BENTYL Take 20 mg by mouth 3 (three) times daily before meals.   esomeprazole 40 MG capsule Commonly known as: NEXIUM Take 40 mg by mouth 2 (two) times daily as needed (takes 1 tablet daily, then 1 addt'l tablet if needed).   HYDROcodone-acetaminophen 10-325 MG tablet Commonly known as: Norco Take 1 tablet by mouth every 6 (six) hours as needed for up to 5 days. What changed: reasons to take this    levothyroxine 25 MCG tablet Commonly known as: SYNTHROID Take 25 mcg by mouth daily before breakfast. 225 mcg total every morning   levothyroxine 200 MCG tablet Commonly known as: SYNTHROID Take 200 mcg by mouth daily before breakfast. 225 mcg total   Lexapro 10 MG tablet Generic drug: escitalopram Take 10 mg by mouth daily.   methocarbamol 500 MG tablet Commonly known as: Robaxin Take 1 tablet (500 mg total) by mouth every 8 (eight) hours as needed for up to 5 days for muscle spasms.   montelukast 10 MG tablet Commonly known as: SINGULAIR Take 10 mg by mouth at bedtime.   ondansetron 4 MG tablet Commonly known as: Zofran Take 1 tablet (4 mg total) by mouth every 8 (eight) hours as needed for nausea or vomiting. What changed:   medication strength  how much to take   ProAir HFA 108 (90 Base) MCG/ACT inhaler Generic drug: albuterol Inhale 2 puffs into the lungs every 4 (four) hours as needed for wheezing or shortness of breath.   QUEtiapine 300 MG tablet Commonly known as: SEROQUEL Take 300 mg by mouth at bedtime.   rOPINIRole 4 MG tablet Commonly known as: REQUIP Take 4 mg by mouth at bedtime.   valACYclovir 1000 MG tablet Commonly known as: VALTREX Take 1,000 mg by mouth daily as needed (cold sores).       Diagnostic Studies: Dg Chest 2 View  Result Date: 12/16/2018 CLINICAL DATA:  Preop for cervical fusion. EXAM: CHEST - 2 VIEW COMPARISON:  Radiographs of Aug 29, 2018. FINDINGS: The heart size and mediastinal contours are within normal limits. Both lungs are clear. The visualized skeletal structures are unremarkable. IMPRESSION: No active cardiopulmonary disease. Electronically Signed   By: Marijo Conception M.D.   On: 12/16/2018 15:07   Dg Cervical Spine 2-3 Views  Result Date: 12/17/2018 CLINICAL DATA:  Surgery EXAM: CERVICAL SPINE - 2-3 VIEW; DG C-ARM 1-60 MIN COMPARISON:  10/03/2018, MRI 09/20/2016 FINDINGS: Two low resolution intraoperative spot views of the  cervical spine. Total fluoroscopy time was 9 seconds. Anterior plate and fixating screws at C5-C6 with interbody device. IMPRESSION: Intraoperative fluoroscopic assistance provided during cervical spine surgery Electronically Signed   By: Donavan Foil M.D.   On: 12/17/2018 18:42   Dg C-arm 1-60 Min  Result Date: 12/17/2018 CLINICAL DATA:  Surgery EXAM: CERVICAL SPINE - 2-3 VIEW; DG C-ARM 1-60 MIN COMPARISON:  10/03/2018, MRI 09/20/2016 FINDINGS: Two low resolution intraoperative spot views of the cervical spine. Total fluoroscopy time was 9 seconds. Anterior plate and fixating screws at C5-C6 with interbody device. IMPRESSION: Intraoperative fluoroscopic assistance provided during cervical spine surgery Electronically Signed   By: Donavan Foil M.D.   On: 12/17/2018 18:42    Discharge Instructions    Incentive spirometry RT   Complete by: As directed       Follow-up Information    Melina Schools, MD. Schedule an appointment as soon  as possible for a visit in 2 weeks.   Specialty: Orthopedic Surgery Why: For suture removal, If symptoms worsen, For wound re-check Contact information: [address] [place] [address] [phone]           Discharge Plan:  discharge to home  Disposition: stable    Signed: Yvonne Kendall Ward for [facility] PA-C [facility] [phone] 12/19/2018, 8:18 AM

## 2019-02-03 DIAGNOSIS — Z4889 Encounter for other specified surgical aftercare: Secondary | ICD-10-CM | POA: Diagnosis not present

## 2019-02-06 DIAGNOSIS — K2100 Gastro-esophageal reflux disease with esophagitis, without bleeding: Secondary | ICD-10-CM | POA: Diagnosis not present

## 2019-02-06 DIAGNOSIS — I10 Essential (primary) hypertension: Secondary | ICD-10-CM | POA: Diagnosis not present

## 2019-02-19 DIAGNOSIS — R300 Dysuria: Secondary | ICD-10-CM | POA: Diagnosis not present

## 2019-02-19 DIAGNOSIS — G259 Extrapyramidal and movement disorder, unspecified: Secondary | ICD-10-CM | POA: Diagnosis not present

## 2019-02-19 DIAGNOSIS — I10 Essential (primary) hypertension: Secondary | ICD-10-CM | POA: Diagnosis not present

## 2019-02-19 DIAGNOSIS — H8111 Benign paroxysmal vertigo, right ear: Secondary | ICD-10-CM | POA: Diagnosis not present

## 2019-02-19 DIAGNOSIS — R739 Hyperglycemia, unspecified: Secondary | ICD-10-CM | POA: Diagnosis not present

## 2019-02-19 DIAGNOSIS — R05 Cough: Secondary | ICD-10-CM | POA: Diagnosis not present

## 2019-02-19 DIAGNOSIS — R1084 Generalized abdominal pain: Secondary | ICD-10-CM | POA: Diagnosis not present

## 2019-02-19 DIAGNOSIS — E039 Hypothyroidism, unspecified: Secondary | ICD-10-CM | POA: Diagnosis not present

## 2019-02-19 DIAGNOSIS — K2100 Gastro-esophageal reflux disease with esophagitis, without bleeding: Secondary | ICD-10-CM | POA: Diagnosis not present

## 2019-02-19 DIAGNOSIS — Z23 Encounter for immunization: Secondary | ICD-10-CM | POA: Diagnosis not present

## 2019-02-19 DIAGNOSIS — Z1389 Encounter for screening for other disorder: Secondary | ICD-10-CM | POA: Diagnosis not present

## 2019-02-19 LAB — HEPATIC FUNCTION PANEL
ALT: 48 — AB (ref 7–35)
AST: 42 — AB (ref 13–35)
Bilirubin, Total: 0.3

## 2019-02-19 LAB — BASIC METABOLIC PANEL
BUN: 10 (ref 4–21)
CO2: 29 — AB (ref 13–22)
Chloride: 104 (ref 99–108)
Creatinine: 0.8 (ref 0.5–1.1)
Glucose: 117
Potassium: 4.6 (ref 3.4–5.3)
Sodium: 146 (ref 137–147)

## 2019-02-19 LAB — CBC: RBC: 4.49 (ref 3.87–5.11)

## 2019-02-19 LAB — HEMOGLOBIN A1C: Hemoglobin A1C: 5.9

## 2019-02-20 LAB — CBC AND DIFFERENTIAL
HCT: 42 (ref 36–46)
Hemoglobin: 13.8 (ref 12.0–16.0)
Platelets: 266 (ref 150–399)
WBC: 11.7

## 2019-02-20 LAB — COMPREHENSIVE METABOLIC PANEL
Albumin: 4.3 (ref 3.5–5.0)
Calcium: 9.7 (ref 8.7–10.7)

## 2019-02-20 LAB — TSH: TSH: 30.9 — AB (ref 0.41–5.90)

## 2019-03-09 DIAGNOSIS — I10 Essential (primary) hypertension: Secondary | ICD-10-CM | POA: Diagnosis not present

## 2019-03-09 DIAGNOSIS — E782 Mixed hyperlipidemia: Secondary | ICD-10-CM | POA: Diagnosis not present

## 2019-03-17 DIAGNOSIS — Z4889 Encounter for other specified surgical aftercare: Secondary | ICD-10-CM | POA: Diagnosis not present

## 2019-03-19 ENCOUNTER — Ambulatory Visit (INDEPENDENT_AMBULATORY_CARE_PROVIDER_SITE_OTHER): Payer: Medicare Other | Admitting: "Endocrinology

## 2019-03-19 ENCOUNTER — Encounter: Payer: Self-pay | Admitting: "Endocrinology

## 2019-03-19 ENCOUNTER — Other Ambulatory Visit: Payer: Self-pay

## 2019-03-19 VITALS — BP 131/85 | HR 92 | Ht 66.0 in | Wt 237.0 lb

## 2019-03-19 DIAGNOSIS — E039 Hypothyroidism, unspecified: Secondary | ICD-10-CM | POA: Diagnosis not present

## 2019-03-19 DIAGNOSIS — E782 Mixed hyperlipidemia: Secondary | ICD-10-CM

## 2019-03-19 DIAGNOSIS — I10 Essential (primary) hypertension: Secondary | ICD-10-CM | POA: Diagnosis not present

## 2019-03-19 DIAGNOSIS — F17200 Nicotine dependence, unspecified, uncomplicated: Secondary | ICD-10-CM

## 2019-03-19 DIAGNOSIS — R7303 Prediabetes: Secondary | ICD-10-CM | POA: Diagnosis not present

## 2019-03-19 MED ORDER — SYNTHROID 200 MCG PO TABS: 200.0000 ug | ORAL_TABLET | Freq: Every day | ORAL | 2 refills | Status: DC

## 2019-03-19 NOTE — Progress Notes (Signed)
Endocrinology Consult Note                                            03/19/2019, 4:50 PM   Subjective:    [name], female    DOB: 1965/04/17, PCP Karsten Ro, DO   Past Medical History:  Diagnosis Date  . Anxiety   . Arthritis   . Asthma   . Cervicogenic headache 08/20/2016  . Chronic headaches   . Chronic pain   . Depression   . Emphysema   . Emphysema of lung ([place])   . GERD (gastroesophageal reflux disease)   . Hypertension   . Hypothyroidism   . Thyroid disease    Past Surgical History:  Procedure Laterality Date  . ANTERIOR CERVICAL DECOMP/DISCECTOMY FUSION N/A 12/17/2018   Procedure: ANTERIOR CERVICAL DECOMPRESSION/DISCECTOMY FUSION CERVIAL FIVE THROUGH SIX;  Surgeon: Melina Schools, MD;  Location: [place];  Service: Orthopedics;  Laterality: N/A;  2.5 hrs  . APPENDECTOMY    . BACK SURGERY     x4  . CHOLECYSTECTOMY  2010  . COLONOSCOPY W/ POLYPECTOMY  07/2013   1 polyp removal  . CYST REMOVAL HAND  2015  . EXCISION MASS UPPER EXTREMETIES Left 02/25/2017   Procedure: EXCISION MASS UPPER EXTREMETIES, LEFT PALM;  Surgeon: Leanora Cover, MD;  Location: [facility];  Service: Orthopedics;  Laterality: Left;   Social History   Socioeconomic History  . Marital status: Married    Spouse name: Not on file  . Number of children: Not on file  . Years of education: Not on file  . Highest education level: Not on file  Occupational History  . Not on file  Tobacco Use  . Smoking status: Current Every Day Smoker    Packs/day: 0.50    Years: 8.00    Pack years: 4.00    Types: Cigarettes  . Smokeless tobacco: Never Used  Substance and Sexual Activity  . Alcohol use: Yes    Comment: rare  . Drug use: No  . Sexual activity: Not on file  Other Topics Concern  . Not on file  Social History Narrative  . Not on file   Social Determinants of Health   Financial Resource Strain:   . Difficulty of Paying Living Expenses:  Not on file  Food Insecurity:   . Worried About Charity fundraiser in the Last Year: Not on file  . Ran Out of Food in the Last Year: Not on file  Transportation Needs:   . Lack of Transportation (Medical): Not on file  . Lack of Transportation (Non-Medical): Not on file  Physical Activity:   . Days of Exercise per Week: Not on file  . Minutes of Exercise per Session: Not on file  Stress:   . Feeling of Stress : Not on file  Social Connections:   . Frequency of Communication with Friends and Family: Not on file  . Frequency of Social Gatherings with Friends and Family: Not on file  . Attends Religious Services: Not on file  . Active Member of Clubs or Organizations: Not on file  . Attends Archivist Meetings: Not on file  . Marital Status: Not on file   Family History  Problem Relation Age of Onset  . Hypertension Mother   . Hypertension Father   .  Heart disease Maternal Grandfather   . Heart disease Paternal Grandmother    Outpatient Encounter Medications as of 03/19/2019  Medication Sig  . ALPRAZolam (XANAX) 1 MG tablet Take 1 mg by mouth 4 (four) times daily as needed for anxiety.   Marland Kitchen atorvastatin (LIPITOR) 20 MG tablet Take 20 mg by mouth at bedtime.  . baclofen (LIORESAL) 20 MG tablet Take 20 mg by mouth 3 (three) times daily.  Marland Kitchen dicyclomine (BENTYL) 20 MG tablet Take 20 mg by mouth 3 (three) times daily before meals.   Marland Kitchen escitalopram (LEXAPRO) 10 MG tablet Take 10 mg by mouth daily.   Marland Kitchen esomeprazole (NEXIUM) 40 MG capsule Take 40 mg by mouth 2 (two) times daily as needed (takes 1 tablet daily, then 1 addt'l tablet if needed).   Marland Kitchen HYDROcodone-acetaminophen (NORCO) 10-325 MG tablet Take 1 tablet by mouth every 6 (six) hours as needed.  . methylPREDNISolone (MEDROL DOSEPAK) 4 MG TBPK tablet Take by mouth daily.  . montelukast (SINGULAIR) 10 MG tablet Take 10 mg by mouth at bedtime.  . ondansetron (ZOFRAN) 4 MG tablet Take 1 tablet (4 mg total) by mouth every 8  (eight) hours as needed for nausea or vomiting.  Marland Kitchen PROAIR HFA 108 (90 BASE) MCG/ACT inhaler Inhale 2 puffs into the lungs every 4 (four) hours as needed for wheezing or shortness of breath.   . [DISCONTINUED] levothyroxine (SYNTHROID) 200 MCG tablet Take 200 mcg by mouth daily before breakfast. 225 mcg total  . [DISCONTINUED] levothyroxine (SYNTHROID) 50 MCG tablet Take 50 mcg by mouth daily before breakfast. 225 mcg total every morning  . SYNTHROID 200 MCG tablet Take 1 tablet (200 mcg total) by mouth daily before breakfast.  . [DISCONTINUED] QUEtiapine (SEROQUEL) 300 MG tablet Take 300 mg by mouth at bedtime.  . [DISCONTINUED] rOPINIRole (REQUIP) 4 MG tablet Take 4 mg by mouth at bedtime.   . [DISCONTINUED] valACYclovir (VALTREX) 1000 MG tablet Take 1,000 mg by mouth daily as needed (cold sores).    No facility-administered encounter medications on file as of 03/19/2019.   ALLERGIES: Allergies  Allergen Reactions  . Pneumococcal Vaccines Itching and Other (See Comments)    Patient received Influenza and Pneumococcal vaccines at the same time-caused severe itching and needs medication to reverse reaction, this caused her to go into cardiac arrest at that time.    Marland Kitchen Amoxicillin Hives  . Codeine Nausea And Vomiting  . Hydromorphone Hcl Nausea And Vomiting  . Sulfonamide Derivatives Hives  . Morphine And Related Hives    VACCINATION STATUS: Immunization History  Administered Date(s) Administered  . Influenza Whole 01/26/2008    HPI Katherine Hayden is 53 y.o. female who presents today with a medical history as above. she is being seen in consultation for hypothyroidism requested by Karsten Ro, DO.  History is obtained directly from the patient.  She was diagnosed with hypothyroidism approximately at age of 40 years.  She has taken various dose of levothyroxine over the years, currently on 250 mcg p.o. daily. -Reportedly, she had difficulty regulating her thyroid function  tests.  Her most recent labs show TSH of 30.9 on February 19, 2019, no associated T4 or T3. She denies any prior history of thyroid surgery.  No family history of thyroid malignancy. She reports progressive weight gain, gained 20 pounds over the last year, cold intolerance, fatigue. -She takes her levothyroxine properly from her description on empty stomach separated from her other medications at least half an hour. She has family  history of hypothyroidism in  her brother. -Her medical history also includes hyperlipidemia and hypertension on treatment.   Review of Systems  Constitutional: + Progressive weight gain, + fatigue, no subjective + subjective hypothermia Eyes: no blurry vision, no xerophthalmia ENT: no sore throat, no nodules palpated in throat, no dysphagia/odynophagia, no hoarseness Cardiovascular: no Chest Pain, no Shortness of Breath, no palpitations, no leg swelling Respiratory: no cough, no shortness of breath Gastrointestinal: no Nausea/Vomiting/Diarhhea Musculoskeletal: no muscle/joint aches Skin: no rashes Neurological: no tremors, no numbness, no tingling, no dizziness Psychiatric: no depression, no anxiety  Objective:    BP 131/85   Pulse 92   Ht 5\' 6"  (1.676 m)   Wt 237 lb (107.5 kg)   LMP 12/15/2013 (Approximate)   BMI 38.25 kg/m   Wt Readings from Last 3 Encounters:  03/19/19 237 lb (107.5 kg)  12/17/18 227 lb (103 kg)  12/16/18 237 lb 3.2 oz (107.6 kg)    Physical Exam  Constitutional:  Body mass index is 38.25 kg/m.,  not in acute distress, normal state of mind Eyes: PERRLA, EOMI, no exophthalmos ENT: moist mucous membranes, + gross thyromegaly, no gross cervical lymphadenopathy Cardiovascular: normal precordial activity, Regular Rate and Rhythm, no Murmur/Rubs/Gallops Respiratory:  adequate breathing efforts, no gross chest deformity, Clear to auscultation bilaterally Gastrointestinal: abdomen soft, Non -tender, No distension, Bowel Sounds  present, no gross organomegaly Musculoskeletal: no gross deformities, strength intact in all four extremities Skin: moist, warm, no rashes Neurological: no tremor with outstretched hands, Deep tendon reflexes normal in bilateral lower extremities.  CMP ( most recent) CMP     Component Value Date/Time   NA 146 02/19/2019 0000   K 4.6 02/19/2019 0000   CL 104 02/19/2019 0000   CO2 29 (A) 02/19/2019 0000   GLUCOSE 89 12/16/2018 1025   BUN 10 02/19/2019 0000   CREATININE 0.8 02/19/2019 0000   CREATININE 0.69 12/16/2018 1025   CALCIUM 9.7 02/19/2019 0000   PROT 7.5 04/14/2017 1623   ALBUMIN 4.3 02/19/2019 0000   AST 42 (A) 02/19/2019 0000   ALT 48 (A) 02/19/2019 0000   ALKPHOS 128 (H) 04/14/2017 1623   BILITOT 0.5 04/14/2017 1623   GFRNONAA >60 12/16/2018 1025   GFRAA >60 12/16/2018 1025     Diabetic Labs (most recent): Lab Results  Component Value Date   HGBA1C 5.9 02/19/2019   HGBA1C  12/18/2007    5.5 (NOTE)   The ADA recommends the following therapeutic goal for glycemic   control related to Hgb A1C measurement:   Goal of Therapy:   < 7.0% Hgb A1C   Reference: American Diabetes Association: Clinical Practice   Recommendations 2008, Diabetes Care,  2008, 31:(Suppl 1).     Lipid Panel ( most recent) Lipid Panel     Component Value Date/Time   CHOL  12/18/2007 0605    166        ATP III CLASSIFICATION:  <200     mg/dL   Desirable  200-239  mg/dL   Borderline High  >=240    mg/dL   High   TRIG 97 12/18/2007 0605   HDL 39 (L) 12/18/2007 0605   CHOLHDL 4.3 12/18/2007 0605   VLDL 19 12/18/2007 0605   LDLCALC (H) 12/18/2007 0605    108        Total Cholesterol/HDL:CHD Risk Coronary Heart Disease Risk Table                     Men  Women  1/2 Average Risk   3.4   3.3     Results for GURTRUDE, ESSICK (MRN [id number]) as of 03/19/2019 17:12  Ref. Range 09/14/2013 13:48 02/19/2019 00:00  TSH Latest Ref Range: 0.41 - 5.90  1.23 30.90 (A)  T4,Free(Direct) Latest  Ref Range: 0.60 - 1.60 ng/dL 1.21       Assessment & Plan:   1. Acquired hypothyroidism - Adair Badman  is being seen at a kind request of Karsten Ro, DO. - I have reviewed her available thyroid records and clinically evaluated the patient. - Based on these reviews, she has longstanding hypothyroidism on relatively large dose of levothyroxine, with still significantly above target TSH of 30.9.  -She will benefit from brand Synthroid.  I discussed and switched her prescription to Synthroid 200 mcg p.o. daily before breakfast.   - We discussed about the correct intake of her thyroid hormone, on empty stomach at fasting, with water, separated by at least 30 minutes from breakfast and other medications,  and separated by more than 4 hours from calcium, iron, multivitamins, acid reflux medications (PPIs). -Patient is made aware of the fact that thyroid hormone replacement is needed for life, dose to be adjusted by periodic monitoring of thyroid function tests.  -Given her palpable thyromegaly, she was offered baseline thyroid ultrasound. -She has A1c of 5.9% consistent with prediabetes.   - she is advised to maintain close follow up with Karsten Ro, DO for primary care needs.   - Time spent with the patient: 45 minutes, of which >50% was spent in obtaining information about her symptoms, reviewing her previous labs/studies,  evaluations, and treatments, counseling her about her longstanding, unregulated hypothyroidism, and developing a plan to confirm the diagnosis and long term treatment based on the latest standards of care/guidelines.    Katherine Hayden participated in the discussions, expressed understanding, and voiced agreement with the above plans.  All questions were answered to her satisfaction. she is encouraged to contact clinic should she have any questions or concerns prior to her return visit.  Follow up plan: Return in about 6 weeks (around 04/30/2019) for  Follow up with Pre-visit Labs, Thyroid / Neck Ultrasound.   Glade Lloyd, MD [facility] [address] [place], [place] [address] Phone: [phone]  Fax: [phone]     03/19/2019, 4:50 PM  This note was partially dictated with voice recognition software. Similar sounding words can be transcribed inadequately or may not  be corrected upon review.

## 2019-03-26 ENCOUNTER — Telehealth: Payer: Self-pay | Admitting: "Endocrinology

## 2019-03-26 ENCOUNTER — Ambulatory Visit (HOSPITAL_COMMUNITY)
Admission: RE | Admit: 2019-03-26 | Discharge: 2019-03-26 | Disposition: A | Discharge status: Home / Self Care | Payer: Medicare Other | Source: Ambulatory Visit | Attending: "Endocrinology | Admitting: "Endocrinology

## 2019-03-26 ENCOUNTER — Other Ambulatory Visit: Payer: Self-pay

## 2019-03-26 DIAGNOSIS — E039 Hypothyroidism, unspecified: Secondary | ICD-10-CM | POA: Insufficient documentation

## 2019-03-26 DIAGNOSIS — E049 Nontoxic goiter, unspecified: Secondary | ICD-10-CM | POA: Diagnosis not present

## 2019-03-26 NOTE — Telephone Encounter (Signed)
Pt would like you to review her ultrasound results and call her

## 2019-03-26 NOTE — Telephone Encounter (Signed)
I tried to reach out to her, did not answer. I left a message.

## 2019-03-27 NOTE — Telephone Encounter (Signed)
I spoke with her

## 2019-03-27 NOTE — Telephone Encounter (Signed)
Pt would like you to call her back when you get finished today

## 2019-04-09 DIAGNOSIS — E1010 Type 1 diabetes mellitus with ketoacidosis without coma: Secondary | ICD-10-CM | POA: Diagnosis not present

## 2019-04-09 DIAGNOSIS — E782 Mixed hyperlipidemia: Secondary | ICD-10-CM | POA: Diagnosis not present

## 2019-04-12 DIAGNOSIS — H5213 Myopia, bilateral: Secondary | ICD-10-CM | POA: Diagnosis not present

## 2019-04-16 DIAGNOSIS — M542 Cervicalgia: Secondary | ICD-10-CM | POA: Diagnosis not present

## 2019-04-20 DIAGNOSIS — L308 Other specified dermatitis: Secondary | ICD-10-CM | POA: Diagnosis not present

## 2019-04-20 DIAGNOSIS — E782 Mixed hyperlipidemia: Secondary | ICD-10-CM | POA: Diagnosis not present

## 2019-04-20 DIAGNOSIS — H00019 Hordeolum externum unspecified eye, unspecified eyelid: Secondary | ICD-10-CM | POA: Diagnosis not present

## 2019-04-21 ENCOUNTER — Encounter (INDEPENDENT_AMBULATORY_CARE_PROVIDER_SITE_OTHER): Payer: Self-pay

## 2019-04-22 ENCOUNTER — Encounter: Payer: Self-pay | Admitting: "Endocrinology

## 2019-04-22 DIAGNOSIS — E039 Hypothyroidism, unspecified: Secondary | ICD-10-CM | POA: Diagnosis not present

## 2019-04-22 LAB — TSH: TSH: 0.02 — AB (ref 0.41–5.90)

## 2019-04-23 DIAGNOSIS — M5030 Other cervical disc degeneration, unspecified cervical region: Secondary | ICD-10-CM | POA: Diagnosis not present

## 2019-04-23 DIAGNOSIS — M5136 Other intervertebral disc degeneration, lumbar region: Secondary | ICD-10-CM | POA: Diagnosis not present

## 2019-04-27 ENCOUNTER — Ambulatory Visit (INDEPENDENT_AMBULATORY_CARE_PROVIDER_SITE_OTHER): Payer: Medicare Other | Admitting: Gastroenterology

## 2019-04-27 ENCOUNTER — Other Ambulatory Visit: Payer: Self-pay

## 2019-04-27 ENCOUNTER — Encounter (INDEPENDENT_AMBULATORY_CARE_PROVIDER_SITE_OTHER): Payer: Self-pay | Admitting: Gastroenterology

## 2019-04-27 VITALS — BP 115/82 | HR 91 | Temp 97.9°F | Ht 66.0 in | Wt 228.1 lb

## 2019-04-27 DIAGNOSIS — R748 Abnormal levels of other serum enzymes: Secondary | ICD-10-CM | POA: Diagnosis not present

## 2019-04-27 DIAGNOSIS — R197 Diarrhea, unspecified: Secondary | ICD-10-CM | POA: Diagnosis not present

## 2019-04-27 DIAGNOSIS — R109 Unspecified abdominal pain: Secondary | ICD-10-CM | POA: Diagnosis not present

## 2019-04-27 NOTE — Patient Instructions (Signed)
We are scheduling you for a colonoscopy for evaluation when Covid restrictions are lifted.  Giving intermittent diarrhea I am checking a celiac panel-this is an allergy to gluten that can cause GI symptoms.  I am concerned that symptoms could be food induced, please keep a log every time you have the diarrhea episodes of what you have had to eat that day.  Look to see if same foods keep reoccurring.  You can use dicyclomine if needed.

## 2019-04-27 NOTE — Progress Notes (Addendum)
Patient profile: Katherine Hayden is a 54 y.o. female seen for follow up of diarrhea. Last seen 2019.   History of Present Illness: Katherine Hayden is seen today for diarrhea - ongoing intermittently since 2019, she reports having "episodes" 1-2x/month, lead to 2-3 days of diarrhea with several loose stools each day, abd cramping, and nausea. Notes that most frequently begin after foods such as [place], has not been able to identify any other food triggers. Denies BIS w/ episodes. Does have nausea-takes zofran which helps some. Also takes dicyclomine during episodes which can help but not relieve symptoms. Tries to limit dairy. When has episodes has upper abd pain radiating to lower abd.   Between episodes she feels fairly well- GERD chronically controlled w/ Nexium 59m once a day (occasionally BID if needed). She denies vomiting, dysphagia. Appetite good. Trying to loose weight - pt is considering bariatric surgery.   Previously used nsaids frequently prior to neck surgery in August 2020 but not using since.   TSH recently elevated, she is switched from generic to brand name thyroid, biggest symptoms with hypothyroidism she reports her fatigue and depression.  She is on Xanax 3 times daily and Norco 3 times daily to 4 times daily.  Wt Readings from Last 3 Encounters:  04/27/19 228 lb 1.6 oz (103.5 kg)  03/19/19 237 lb (107.5 kg)  12/17/18 227 lb (103 kg)     Last Colonoscopy: 2009-normal, family hx colon polyps  Last Endoscopy: none prior    Past Medical History:  Past Medical History:  Diagnosis Date  . Anxiety   . Arthritis   . Asthma   . Cervicogenic headache 08/20/2016  . Chronic headaches   . Chronic pain   . Depression   . Emphysema   . [place]   . GERD (gastroesophageal reflux disease)   . Hypertension   . Hypothyroidism   . Thyroid disease     Problem List: Patient Active Problem List   Diagnosis Date Noted  . Prediabetes 03/19/2019  .  Essential hypertension, benign 03/19/2019  . Mixed hyperlipidemia 03/19/2019  . Cervical disc herniation 12/17/2018  . Cervicogenic headache 08/20/2016  . Dizzy 08/20/2016  . Current smoker 03/10/2010  . DEPRESSION 03/10/2010  . ASTHMA 03/10/2010  . CONJUNCTIVITIS, ACUTE 12/05/2007  . SARCOIDOSIS 11/01/2006  . Hypothyroidism 11/01/2006  . GERD 11/01/2006  . 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 [place]EASE, LUMBAR 11/01/2006  . BURSITIS, SHOULDER 11/01/2006    Past Surgical History: Past Surgical History:  Procedure Laterality Date  . ANTERIOR CERVICAL DECOMP/DISCECTOMY FUSION N/A 12/17/2018   Procedure: ANTERIOR CERVICAL DECOMPRESSION/DISCECTOMY FUSION CERVIAL FIVE THROUGH SIX;  Surgeon: BMelina Schools MD;  Location[place]  Service: Orthopedics;  Laterality: N/A;  2.5 hrs  . APPENDECTOMY    . BACK SURGERY     x4  . CHOLECYSTECTOMY  2010  . COLONOSCOPY W/ POLYPECTOMY  07/2013   1 polyp removal  . CYST REMOVAL HAND  2015  . EXCISION MASS UPPER EXTREMETIES Left 02/25/2017   Procedure: EXCISION MASS UPPER EXTREMETIES, LEFT PALM;  Surgeon: KLeanora Cover MD;  [place]  Service: Orthopedics;  Laterality: Left;    Allergies: Allergies  Allergen Reactions  . Pneumococcal Vaccines Itching and Other (See Comments)    Patient received Influenza and Pneumococcal vaccines at the same time-caused severe itching and needs medication to reverse reaction, this caused her to go into cardiac arrest at that time.    .Marland Kitchen  Amoxicillin Hives  . Codeine Nausea And Vomiting  . Hydromorphone Hcl Nausea And Vomiting  . Sulfonamide Derivatives Hives  . Morphine And Related Hives      Home Medications:  Current Outpatient Medications:  .  ALPRAZolam (XANAX) 1 MG tablet, Take 1 mg by mouth 4 (four) times daily as needed for anxiety. , Disp: , Rfl:  .  atorvastatin (LIPITOR) 20 MG tablet, Take 20 mg by mouth at bedtime., Disp: , Rfl:  .  baclofen (LIORESAL)  20 MG tablet, Take 20 mg by mouth 3 (three) times daily., Disp: , Rfl:  .  dicyclomine (BENTYL) 20 MG tablet, Take 20 mg by mouth 3 (three) times daily before meals. , Disp: , Rfl: 2 .  escitalopram (LEXAPRO) 10 MG tablet, Take 10 mg by mouth daily. , Disp: , Rfl:  .  esomeprazole (NEXIUM) 40 MG capsule, Take 40 mg by mouth 2 (two) times daily as needed (takes 1 tablet daily, then 1 addt'l tablet if needed). , Disp: , Rfl:  .  HYDROcodone-acetaminophen (NORCO) 10-325 MG tablet, Take 1 tablet by mouth every 6 (six) hours as needed., Disp: , Rfl:  .  montelukast (SINGULAIR) 10 MG tablet, Take 10 mg by mouth at bedtime., Disp: , Rfl:  .  nicotine (NICODERM CQ - DOSED IN MG/24 HOURS) 14 mg/24hr patch, Place 14 mg onto the skin daily., Disp: , Rfl:  .  ondansetron (ZOFRAN) 4 MG tablet, Take 1 tablet (4 mg total) by mouth every 8 (eight) hours as needed for nausea or vomiting., Disp: 20 tablet, Rfl: 0 .  PROAIR HFA 108 (90 BASE) MCG/ACT inhaler, Inhale 2 puffs into the lungs every 4 (four) hours as needed for wheezing or shortness of breath. , Disp: , Rfl:  .  SYNTHROID 200 MCG tablet, Take 1 tablet (200 mcg total) by mouth daily before breakfast., Disp: 30 tablet, Rfl: 2 .  varenicline (CHANTIX) 0.5 MG tablet, Take 0.5 mg by mouth 2 (two) times daily., Disp: , Rfl:    Family History: family history includes Heart disease in her maternal grandfather and paternal grandmother; Hypertension in her father and mother.    Mom - likely colon polyps - passed from COPD  Dad - alive, unknown health.   Social History:   Smokes - 4-5 cigarettes daily Alcohol 1x/month  No drugs   Previously taking ibuprofen heavy prior to neck surgery 5-6 months ago   Review of Systems: Constitutional: + weight loss   Eyes: No changes in vision. ENT: No oral lesions, sore throat.  GI: see HPI.  Heme/Lymph: No easy bruising.  CV: No chest pain.  GU: No hematuria.  Integumentary: No rashes.  Neuro: No headaches.    Psych: No depression/anxiety.  Endocrine: No heat/cold intolerance.  Allergic/Immunologic: No urticaria.  Resp: No cough, SOB.  Musculoskeletal: No joint swelling.    Physical Examination: BP 115/82 (BP Location: Right Arm, Patient Position: Sitting, Cuff Size: Large)   Pulse 91   Temp 97.9 F (36.6 C) (Temporal)   Ht 5' 6"  (1.676 m)   Wt 228 lb 1.6 oz (103.5 kg)   LMP 12/15/2013 (Approximate)   BMI 36.82 kg/m  Gen: NAD, alert and oriented x 4 HEENT: PEERLA, EOMI, Neck: supple, no JVD Chest: CTA bilaterally, no wheezes, crackles, or other adventitious sounds CV: RRR, no m/g/c/r Abd: soft, NT, ND, +BS in all four quadrants; no HSM, guarding, ridigity, or rebound tenderness Ext: no edema, well perfused with 2+ pulses, Skin: no rash or lesions noted  on observed skin Lymph: no noted LAD  Data Reviewed:  02/2019-TSH 30.9, CBC normal, AST 42, ATL 48   Paper labs reviewed to be scanned to chart- alk phos 199 on 02/19/2019    Assessment/Plan: Ms. Goehring is a 54 y.o. female   Orders Placed This Encounter  Procedures  . Celiac Disease Panel    1. Diarrhea - given episodic and more so after [place] foods suspect food sensitive/ibs. Less likely infectious, microscopic colitis, etc. we discussed monitoring for food triggers and keeping a diet log.  We will give her dicyclomine to use as needed during episodes.  We will also check a celiac panel.  Avoid known trigger foods.  Stools fairly formed without abdominal pain between episodes.  2.  Colon cancer screening-last colonoscopy 2009, overdue for repeat.  Order placed in chart to be entered when Covid restrictions lifted.  Denies prior issues with sedation.  3.  GERD-well-controlled on PPI.  Continue diet modifications.  She is trying to lose weight which should help.  No longer on NSAIDs.   Patient denies CP, SOB, and use of blood thinners. I discussed the risks and benefits of procedure including bleeding, perforation,  infection, missed lesions, medication reactions and possible hospitalization or surgery if complications. All questions answered.    Addendum-received her paper lab work copies from PCP and alk phos elevated to 199.  We will repeat her LFTs and add on a GGT as well.  If continues to be elevated will need serologic work-up.  I personally performed the service, non-incident to. (WP)  Laurine Blazer, [facility]

## 2019-04-28 ENCOUNTER — Telehealth (INDEPENDENT_AMBULATORY_CARE_PROVIDER_SITE_OTHER): Payer: Self-pay | Admitting: Gastroenterology

## 2019-04-28 LAB — CELIAC DISEASE PANEL
(tTG) Ab, IgA: 1 U/mL
(tTG) Ab, IgG: 2 U/mL
Gliadin IgA: 10 Units
Gliadin IgG: 2 Units
Immunoglobulin A: 485 mg/dL — ABNORMAL HIGH (ref 47–310)

## 2019-04-28 NOTE — Addendum Note (Signed)
Addended by: Laurine Blazer A on: 04/28/2019 07:40 AM   Modules accepted: Orders

## 2019-04-28 NOTE — Telephone Encounter (Signed)
Please call patient and let her know when I received the paper copies of her lab work one of her liver enzymes was elevated, I would like to recheck this to see if still elevated.    I have placed an order in her chart and she can come to [facility] at her convenience for repeat labs.  Thanks.

## 2019-04-29 DIAGNOSIS — M5136 Other intervertebral disc degeneration, lumbar region: Secondary | ICD-10-CM | POA: Diagnosis not present

## 2019-04-29 DIAGNOSIS — M542 Cervicalgia: Secondary | ICD-10-CM | POA: Diagnosis not present

## 2019-04-30 ENCOUNTER — Ambulatory Visit: Payer: Medicare Other | Admitting: "Endocrinology

## 2019-04-30 ENCOUNTER — Telehealth (INDEPENDENT_AMBULATORY_CARE_PROVIDER_SITE_OTHER): Payer: Self-pay | Admitting: Gastroenterology

## 2019-04-30 NOTE — Telephone Encounter (Signed)
Patient returned your call - ph# [phone]

## 2019-04-30 NOTE — Telephone Encounter (Signed)
Katherine Hayden - please call and let her know the below information (I also left VM for her this AM)   Celiac panel normal. Did also remind her that I got a copy of her labs from her PCP after visit and liver enzymes are elevated, orders have been placed in chart to repeat. Remind her to come to [facility] for labs at her convenience. Thanks.

## 2019-05-04 ENCOUNTER — Ambulatory Visit (INDEPENDENT_AMBULATORY_CARE_PROVIDER_SITE_OTHER): Payer: Medicare Other | Admitting: "Endocrinology

## 2019-05-04 ENCOUNTER — Encounter: Payer: Self-pay | Admitting: "Endocrinology

## 2019-05-04 DIAGNOSIS — E063 Autoimmune thyroiditis: Secondary | ICD-10-CM

## 2019-05-04 DIAGNOSIS — E039 Hypothyroidism, unspecified: Secondary | ICD-10-CM | POA: Diagnosis not present

## 2019-05-04 MED ORDER — SYNTHROID 175 MCG PO TABS: 175.0000 ug | ORAL_TABLET | Freq: Every day | ORAL | 3 refills | Status: DC

## 2019-05-04 NOTE — Progress Notes (Signed)
05/04/2019, 4:59 PM                                Endocrinology Telehealth Visit Follow up Note -During COVID -19 Pandemic  I connected with Katherine Hayden on 05/04/2019   by telephone and verified that I am speaking with the correct person using two identifiers. Katherine Hayden, 1965/04/13. she has verbally consented to this visit. All issues noted in this document were discussed and addressed. The format was not optimal for physical exam.   Subjective:    [name], female    DOB: 05/25/1965, PCP Karsten Ro, DO   Past Medical History:  Diagnosis Date  . Anxiety   . Arthritis   . Asthma   . Cervicogenic headache 08/20/2016  . Chronic headaches   . Chronic pain   . Depression   . Emphysema   . Emphysema of lung ([place])   . GERD (gastroesophageal reflux disease)   . Hypertension   . Hypothyroidism   . Thyroid disease    Past Surgical History:  Procedure Laterality Date  . ANTERIOR CERVICAL DECOMP/DISCECTOMY FUSION N/A 12/17/2018   Procedure: ANTERIOR CERVICAL DECOMPRESSION/DISCECTOMY FUSION CERVIAL FIVE THROUGH SIX;  Surgeon: Melina Schools, MD;  Location: [facility];  Service: Orthopedics;  Laterality: N/A;  2.5 hrs  . APPENDECTOMY    . BACK SURGERY     x4  . CHOLECYSTECTOMY  2010  . COLONOSCOPY W/ POLYPECTOMY  07/2013   1 polyp removal  . CYST REMOVAL HAND  2015  . EXCISION MASS UPPER EXTREMETIES Left 02/25/2017   Procedure: EXCISION MASS UPPER EXTREMETIES, LEFT PALM;  Surgeon: Leanora Cover, MD;  Location: [facility];  Service: Orthopedics;  Laterality: Left;   Social History   Socioeconomic History  . Marital status: Married    Spouse name: Not on file  . Number of children: Not on file  . Years of education: Not on file  . Highest education level: Not on file  Occupational History  . Not on file  Tobacco Use  . Smoking status: Current Every Day Smoker   Packs/day: 0.50    Years: 8.00    Pack years: 4.00    Types: Cigarettes  . Smokeless tobacco: Never Used  Substance and Sexual Activity  . Alcohol use: Yes    Comment: rare  . Drug use: No  . Sexual activity: Not on file  Other Topics Concern  . Not on file  Social History Narrative  . Not on file   Social Determinants of Health   Financial Resource Strain:   . Difficulty of Paying Living Expenses: Not on file  Food Insecurity:   . Worried About Charity fundraiser in the Last Year: Not on file  . Ran Out of Food in the Last Year: Not on file  Transportation Needs:   . Lack of Transportation (Medical): Not on file  . Lack of Transportation (Non-Medical): Not on file  Physical Activity:   . Days of Exercise per Week: Not on file  . Minutes of Exercise per Session: Not on file  Stress:   . Feeling of Stress :  Not on file  Social Connections:   . Frequency of Communication with Friends and Family: Not on file  . Frequency of Social Gatherings with Friends and Family: Not on file  . Attends Religious Services: Not on file  . Active Member of Clubs or Organizations: Not on file  . Attends Archivist Meetings: Not on file  . Marital Status: Not on file   Family History  Problem Relation Age of Onset  . Hypertension Mother   . Hypertension Father   . Heart disease Maternal Grandfather   . Heart disease Paternal Grandmother    Outpatient Encounter Medications as of 05/04/2019  Medication Sig  . ALPRAZolam (XANAX) 1 MG tablet Take 1 mg by mouth 4 (four) times daily as needed for anxiety.   Marland Kitchen atorvastatin (LIPITOR) 20 MG tablet Take 20 mg by mouth at bedtime.  . baclofen (LIORESAL) 20 MG tablet Take 20 mg by mouth 3 (three) times daily.  Marland Kitchen dicyclomine (BENTYL) 20 MG tablet Take 20 mg by mouth 3 (three) times daily before meals.   Marland Kitchen escitalopram (LEXAPRO) 10 MG tablet Take 10 mg by mouth daily.   Marland Kitchen esomeprazole (NEXIUM) 40 MG capsule Take 40 mg by mouth 2 (two) times  daily as needed (takes 1 tablet daily, then 1 addt'l tablet if needed).   Marland Kitchen HYDROcodone-acetaminophen (NORCO) 10-325 MG tablet Take 1 tablet by mouth every 6 (six) hours as needed.  . montelukast (SINGULAIR) 10 MG tablet Take 10 mg by mouth at bedtime.  . nicotine (NICODERM CQ - DOSED IN MG/24 HOURS) 14 mg/24hr patch Place 14 mg onto the skin daily.  . ondansetron (ZOFRAN) 4 MG tablet Take 1 tablet (4 mg total) by mouth every 8 (eight) hours as needed for nausea or vomiting.  Marland Kitchen PROAIR HFA 108 (90 BASE) MCG/ACT inhaler Inhale 2 puffs into the lungs every 4 (four) hours as needed for wheezing or shortness of breath.   . SYNTHROID 175 MCG tablet Take 1 tablet (175 mcg total) by mouth daily before breakfast.  . varenicline (CHANTIX) 0.5 MG tablet Take 0.5 mg by mouth 2 (two) times daily.  . [DISCONTINUED] SYNTHROID 200 MCG tablet Take 1 tablet (200 mcg total) by mouth daily before breakfast.   No facility-administered encounter medications on file as of 05/04/2019.   ALLERGIES: Allergies  Allergen Reactions  . Pneumococcal Vaccines Itching and Other (See Comments)    Patient received Influenza and Pneumococcal vaccines at the same time-caused severe itching and needs medication to reverse reaction, this caused her to go into cardiac arrest at that time.    Marland Kitchen Amoxicillin Hives  . Codeine Nausea And Vomiting  . Hydromorphone Hcl Nausea And Vomiting  . Sulfonamide Derivatives Hives  . Morphine And Related Hives    VACCINATION STATUS: Immunization History  Administered Date(s) Administered  . Influenza Whole 01/26/2008    HPI Katherine Hayden is 54 y.o. female who is being engaged in telehealth via telephone in follow-up for hypothyroidism.    PMD: Karsten Ro, DO.    She was diagnosed with hypothyroidism approximately at age of 53 years.  She has taken various dose of levothyroxine over the years.. -During her last visit, she was initiated on Synthroid 200 mcg p.o. daily before  breakfast.  She is compliant.  Her previsit labs are consistent with over replacement.   -She has no new complaints today.  See note from last visit.  She has family history of hypothyroidism in  her brother. -Her medical  history also includes hyperlipidemia and hypertension on treatment.   Review of Systems  Limited as above.  Objective:    LMP 12/15/2013 (Approximate)   Wt Readings from Last 3 Encounters:  04/27/19 228 lb 1.6 oz (103.5 kg)  03/19/19 237 lb (107.5 kg)  12/17/18 227 lb (103 kg)    Physical Exam    CMP ( most recent) CMP     Component Value Date/Time   NA 146 02/19/2019 0000   K 4.6 02/19/2019 0000   CL 104 02/19/2019 0000   CO2 29 (A) 02/19/2019 0000   GLUCOSE 89 12/16/2018 1025   BUN 10 02/19/2019 0000   CREATININE 0.8 02/19/2019 0000   CREATININE 0.69 12/16/2018 1025   CALCIUM 9.7 02/19/2019 0000   PROT 7.5 04/14/2017 1623   ALBUMIN 4.3 02/19/2019 0000   AST 42 (A) 02/19/2019 0000   ALT 48 (A) 02/19/2019 0000   ALKPHOS 128 (H) 04/14/2017 1623   BILITOT 0.5 04/14/2017 1623   GFRNONAA >60 12/16/2018 1025   GFRAA >60 12/16/2018 1025     Diabetic Labs (most recent): Lab Results  Component Value Date   HGBA1C 5.9 02/19/2019   HGBA1C  12/18/2007    5.5 (NOTE)   The ADA recommends the following therapeutic goal for glycemic   control related to Hgb A1C measurement:   Goal of Therapy:   < 7.0% Hgb A1C   Reference: American Diabetes Association: Clinical Practice   Recommendations 2008, Diabetes Care,  2008, 31:(Suppl 1).     Lipid Panel ( most recent) Lipid Panel     Component Value Date/Time   CHOL  12/18/2007 0605    166        ATP III CLASSIFICATION:  <200     mg/dL   Desirable  200-239  mg/dL   Borderline High  >=240    mg/dL   High   TRIG 97 12/18/2007 0605   HDL 39 (L) 12/18/2007 0605   CHOLHDL 4.3 12/18/2007 0605   VLDL 19 12/18/2007 0605   LDLCALC (H) 12/18/2007 0605    108        Total Cholesterol/HDL:CHD Risk Coronary  Heart Disease Risk Table                     Men   Women  1/2 Average Risk   3.4   3.3     Results for TRISTANY, ZACHERY (MRN [id number]) as of 05/04/2019 17:02  Ref. Range 04/22/2019 00:00  TSH Latest Ref Range: 0.41 - 5.90  0.02 (A)   On March 26, 2019: Her baseline thyroid ultrasound is unremarkable with no discrete nodules, overall size of her thyroid is smaller than average-right lobe 4.2 cm, left lobe 3.5 cm.  Assessment & Plan:   1. Acquired hypothyroidism 2.  Hashimoto's thyroiditis -Her previsit work-up confirms etiology for hypothyroidism is Hashimoto's thyroiditis.  There is a good evidence that she will benefit from Synthroid treatment as opposed to levothyroxine.  Her previsit thyroid function tests are consistent with over replacement with 200 mcg of Synthroid.  I discussed and lowered her dose of Synthroid to 175 mcg p.o. daily before breakfast.     - We discussed about the correct intake of her thyroid hormone, on empty stomach at fasting, with water, separated by at least 30 minutes from breakfast and other medications,  and separated by more than 4 hours from calcium, iron, multivitamins, acid reflux medications (PPIs). -Patient is made aware of the fact that thyroid hormone  replacement is needed for life, dose to be adjusted by periodic monitoring of thyroid function tests.   -Given her palpable thyromegaly, she was offered baseline thyroid ultrasound. -She has A1c of 5.9% consistent with prediabetes.   - she is advised to maintain close follow up with Karsten Ro, DO for primary care needs.     - Time spent on this patient care encounter:  25 minutes of which 50% was spent in  counseling and the rest reviewing  her current and  previous labs / studies and medications  doses and developing a plan for long term care. Katherine Hayden  participated in the discussions, expressed understanding, and voiced agreement with the above plans.  All questions were  answered to her satisfaction. she is encouraged to contact clinic should she have any questions or concerns prior to her return visit.   Follow up plan: Return in about 3 months (around 08/02/2019) for Follow up with Pre-visit Labs.   Glade Lloyd, MD [facility] [address] [place], [place] [address] Phone: [phone]  Fax: [phone]     05/04/2019, 4:59 PM  This note was partially dictated with voice recognition software. Similar sounding words can be transcribed inadequately or may not  be corrected upon review.

## 2019-05-05 NOTE — Telephone Encounter (Signed)
Patient called and made aware. She plans to go this week for repeat labs and will go to [facility].

## 2019-05-07 DIAGNOSIS — M5136 Other intervertebral disc degeneration, lumbar region: Secondary | ICD-10-CM | POA: Diagnosis not present

## 2019-05-07 DIAGNOSIS — R748 Abnormal levels of other serum enzymes: Secondary | ICD-10-CM | POA: Diagnosis not present

## 2019-05-07 DIAGNOSIS — M542 Cervicalgia: Secondary | ICD-10-CM | POA: Diagnosis not present

## 2019-05-08 DIAGNOSIS — K219 Gastro-esophageal reflux disease without esophagitis: Secondary | ICD-10-CM | POA: Diagnosis not present

## 2019-05-08 DIAGNOSIS — I10 Essential (primary) hypertension: Secondary | ICD-10-CM | POA: Diagnosis not present

## 2019-05-08 LAB — HEPATIC FUNCTION PANEL
AG Ratio: 1.6 (calc) (ref 1.0–2.5)
ALT: 40 U/L — ABNORMAL HIGH (ref 6–29)
AST: 26 U/L (ref 10–35)
Albumin: 4 g/dL (ref 3.6–5.1)
Alkaline phosphatase (APISO): 133 U/L (ref 37–153)
Bilirubin, Direct: 0.1 mg/dL (ref 0.0–0.2)
Globulin: 2.5 g/dL (calc) (ref 1.9–3.7)
Indirect Bilirubin: 0.3 mg/dL (calc) (ref 0.2–1.2)
Total Bilirubin: 0.4 mg/dL (ref 0.2–1.2)
Total Protein: 6.5 g/dL (ref 6.1–8.1)

## 2019-05-08 LAB — GAMMA GT: GGT: 53 U/L (ref 3–70)

## 2019-05-18 ENCOUNTER — Encounter (INDEPENDENT_AMBULATORY_CARE_PROVIDER_SITE_OTHER): Payer: Self-pay | Admitting: *Deleted

## 2019-05-18 ENCOUNTER — Telehealth (INDEPENDENT_AMBULATORY_CARE_PROVIDER_SITE_OTHER): Payer: Self-pay | Admitting: *Deleted

## 2019-05-18 ENCOUNTER — Other Ambulatory Visit (INDEPENDENT_AMBULATORY_CARE_PROVIDER_SITE_OTHER): Payer: Self-pay | Admitting: *Deleted

## 2019-05-18 DIAGNOSIS — Z1211 Encounter for screening for malignant neoplasm of colon: Secondary | ICD-10-CM

## 2019-05-18 MED ORDER — SUPREP BOWEL PREP KIT 17.5-3.13-1.6 GM/177ML PO SOLN: 1.0000 | Freq: Once | ORAL | 0 refills | Status: AC

## 2019-05-18 NOTE — Telephone Encounter (Signed)
Patient needs suprep TCS sch'd 3/5

## 2019-05-20 DIAGNOSIS — I10 Essential (primary) hypertension: Secondary | ICD-10-CM | POA: Diagnosis not present

## 2019-05-20 DIAGNOSIS — R1084 Generalized abdominal pain: Secondary | ICD-10-CM | POA: Diagnosis not present

## 2019-05-20 DIAGNOSIS — R197 Diarrhea, unspecified: Secondary | ICD-10-CM | POA: Diagnosis not present

## 2019-05-20 DIAGNOSIS — G259 Extrapyramidal and movement disorder, unspecified: Secondary | ICD-10-CM | POA: Diagnosis not present

## 2019-05-20 DIAGNOSIS — H8111 Benign paroxysmal vertigo, right ear: Secondary | ICD-10-CM | POA: Diagnosis not present

## 2019-05-26 DIAGNOSIS — M542 Cervicalgia: Secondary | ICD-10-CM | POA: Diagnosis not present

## 2019-05-26 DIAGNOSIS — M5136 Other intervertebral disc degeneration, lumbar region: Secondary | ICD-10-CM | POA: Diagnosis not present

## 2019-06-01 ENCOUNTER — Telehealth: Payer: Self-pay | Admitting: Plastic Surgery

## 2019-06-01 NOTE — Telephone Encounter (Signed)

## 2019-06-02 ENCOUNTER — Encounter: Payer: Self-pay | Admitting: Plastic Surgery

## 2019-06-02 ENCOUNTER — Ambulatory Visit (INDEPENDENT_AMBULATORY_CARE_PROVIDER_SITE_OTHER): Payer: Medicare Other | Admitting: Plastic Surgery

## 2019-06-02 ENCOUNTER — Other Ambulatory Visit: Payer: Self-pay

## 2019-06-02 DIAGNOSIS — N62 Hypertrophy of breast: Secondary | ICD-10-CM

## 2019-06-02 DIAGNOSIS — M546 Pain in thoracic spine: Secondary | ICD-10-CM

## 2019-06-02 DIAGNOSIS — M549 Dorsalgia, unspecified: Secondary | ICD-10-CM | POA: Insufficient documentation

## 2019-06-02 DIAGNOSIS — M542 Cervicalgia: Secondary | ICD-10-CM | POA: Diagnosis not present

## 2019-06-02 DIAGNOSIS — G8929 Other chronic pain: Secondary | ICD-10-CM

## 2019-06-02 DIAGNOSIS — M5136 Other intervertebral disc degeneration, lumbar region: Secondary | ICD-10-CM | POA: Diagnosis not present

## 2019-06-02 NOTE — Progress Notes (Signed)
[name], female    DOB: September 19, 1965, 54 y.o.   MRN: [id number]   Chief Complaint  Patient presents with  . Breast Problem    Mammary Hyperplasia: The patient is a 54 y.o. female with a history of mammary hyperplasia for several years.  She has extremely large breasts causing symptoms that include the following: Back pain in the upper and lower back, including neck pain. She pulls or pins her bra straps to provide better lift and relief of the pressure and pain. She notices relief by holding her breast up manually.  Her shoulder straps cause grooves and pain and pressure that requires padding for relief. Pain medication is sometimes required with motrin and tylenol.  Activities that are hindered by enlarged breasts include: exercise and running.   Her breasts are extremely large and fairly symmetric.  She has hyperpigmentation of the inframammary area on both sides.  The sternal to nipple distance on the right is 33 cm and the left is 33 cm.  The IMF distance is 13 cm.  She is 5 feet 6 inches tall and weighs 227 pounds.  Preoperative bra size = 44D cup.  The estimated excess breast tissue to be removed at the time of surgery = 500 grams on the left and 500 grams on the right.  Mammogram history: 2020 and was negative.  Due for another for this year.  She has already been through PT and it did not improve the back and neck pain.  She has a history of tobacco use.   Review of Systems  Constitutional: Positive for activity change. Negative for appetite change.  HENT: Negative.   Eyes: Negative.   Respiratory: Negative.  Negative for chest tightness and shortness of breath.   Cardiovascular: Negative.   Gastrointestinal: Negative.   Endocrine: Negative.   Genitourinary: Negative.   Musculoskeletal: Positive for back pain and neck pain.  Skin: Negative for color change and wound.  Hematological: Negative.   Psychiatric/Behavioral: Negative.     Past Medical  History:  Diagnosis Date  . Anxiety   . Arthritis   . Asthma   . Cervicogenic headache 08/20/2016  . Chronic headaches   . Chronic pain   . Depression   . Emphysema   . Emphysema of lung ([place])   . GERD (gastroesophageal reflux disease)   . Hypertension   . Hypothyroidism   . Thyroid disease     Past Surgical History:  Procedure Laterality Date  . ANTERIOR CERVICAL DECOMP/DISCECTOMY FUSION N/A 12/17/2018   Procedure: ANTERIOR CERVICAL DECOMPRESSION/DISCECTOMY FUSION CERVIAL FIVE THROUGH SIX;  Surgeon: Melina Schools, MD;  Location: [facility];  Service: Orthopedics;  Laterality: N/A;  2.5 hrs  . APPENDECTOMY    . BACK SURGERY     x4  . CHOLECYSTECTOMY  2010  . COLONOSCOPY W/ POLYPECTOMY  07/2013   1 polyp removal  . CYST REMOVAL HAND  2015  . EXCISION MASS UPPER EXTREMETIES Left 02/25/2017   Procedure: EXCISION MASS UPPER EXTREMETIES, LEFT PALM;  Surgeon: Leanora Cover, MD;  Location: [facility];  Service: Orthopedics;  Laterality: Left;      Current Outpatient Medications:  .  ALPRAZolam (XANAX) 1 MG tablet, Take 1 mg by mouth 4 (four) times daily as needed for anxiety. , Disp: , Rfl:  .  atorvastatin (LIPITOR) 20 MG tablet, Take 20 mg by mouth at bedtime., Disp: , Rfl:  .  baclofen (LIORESAL) 20 MG tablet, Take 20 mg by  mouth 3 (three) times daily., Disp: , Rfl:  .  dicyclomine (BENTYL) 20 MG tablet, Take 20 mg by mouth 3 (three) times daily before meals. , Disp: , Rfl: 2 .  escitalopram (LEXAPRO) 10 MG tablet, Take 10 mg by mouth daily. , Disp: , Rfl:  .  esomeprazole (NEXIUM) 40 MG capsule, Take 40 mg by mouth 2 (two) times daily as needed (takes 1 tablet daily, then 1 addt'l tablet if needed). , Disp: , Rfl:  .  HYDROcodone-acetaminophen (NORCO) 10-325 MG tablet, Take 1 tablet by mouth every 6 (six) hours as needed., Disp: , Rfl:  .  montelukast (SINGULAIR) 10 MG tablet, Take 10 mg by mouth at bedtime., Disp: , Rfl:  .  nicotine (NICODERM CQ - DOSED IN MG/24 HOURS)  14 mg/24hr patch, Place 14 mg onto the skin daily., Disp: , Rfl:  .  ondansetron (ZOFRAN) 4 MG tablet, Take 1 tablet (4 mg total) by mouth every 8 (eight) hours as needed for nausea or vomiting., Disp: 20 tablet, Rfl: 0 .  PROAIR HFA 108 (90 BASE) MCG/ACT inhaler, Inhale 2 puffs into the lungs every 4 (four) hours as needed for wheezing or shortness of breath. , Disp: , Rfl:  .  SYNTHROID 175 MCG tablet, Take 1 tablet (175 mcg total) by mouth daily before breakfast., Disp: 30 tablet, Rfl: 3 .  varenicline (CHANTIX) 0.5 MG tablet, Take 0.5 mg by mouth 2 (two) times daily., Disp: , Rfl:    Objective:   Vitals:   06/02/19 1523  BP: 135/83  Pulse: 85  Resp: 16  Temp: 97.7 F (36.5 C)  SpO2: 98%    Physical Exam Vitals and nursing note reviewed.  Constitutional:      Appearance: Normal appearance.  HENT:     Head: Normocephalic and atraumatic.  Cardiovascular:     Rate and Rhythm: Normal rate.     Pulses: Normal pulses.  Pulmonary:     Effort: Pulmonary effort is normal.  Abdominal:     General: Abdomen is flat. There is no distension.     Tenderness: There is no abdominal tenderness.  Skin:    General: Skin is warm.     Capillary Refill: Capillary refill takes less than 2 seconds.  Neurological:     General: No focal deficit present.     Mental Status: She is alert and oriented to person, place, and time. Mental status is at baseline.  Psychiatric:        Mood and Affect: Mood normal.        Thought Content: Thought content normal.     Assessment & Plan:  Chronic bilateral thoracic back pain  Neck pain  Symptomatic mammary hypertrophy  Must be 6 weeks free from tobacco use before surgery.  She needs an updated mammogram.  We need to get her physical therapy reports.    She is a candidate for bilateral breast reduction.  Pictures were obtained of the patient and placed in the chart with the patient's or guardian's permission.  Wallace Going, DO   The 21st  Century Cures Act was signed into law in 2016 which includes the topic of electronic health records.  This provides immediate access to information in MyChart.  This includes consultation notes, operative notes, office notes, lab results and pathology reports.  If you have any questions about what you read please let us know at your next visit or call us at the office.  We are right here with you.

## 2019-06-04 DIAGNOSIS — M5136 Other intervertebral disc degeneration, lumbar region: Secondary | ICD-10-CM | POA: Diagnosis not present

## 2019-06-04 DIAGNOSIS — M542 Cervicalgia: Secondary | ICD-10-CM | POA: Diagnosis not present

## 2019-06-05 DIAGNOSIS — E7849 Other hyperlipidemia: Secondary | ICD-10-CM | POA: Diagnosis not present

## 2019-06-05 DIAGNOSIS — E039 Hypothyroidism, unspecified: Secondary | ICD-10-CM | POA: Diagnosis not present

## 2019-06-05 DIAGNOSIS — I10 Essential (primary) hypertension: Secondary | ICD-10-CM | POA: Diagnosis not present

## 2019-06-05 DIAGNOSIS — M5137 Other intervertebral disc degeneration, lumbosacral region: Secondary | ICD-10-CM | POA: Diagnosis not present

## 2019-06-08 ENCOUNTER — Encounter (INDEPENDENT_AMBULATORY_CARE_PROVIDER_SITE_OTHER): Payer: Self-pay | Admitting: Bariatrics

## 2019-06-08 ENCOUNTER — Other Ambulatory Visit: Payer: Self-pay

## 2019-06-08 ENCOUNTER — Ambulatory Visit (INDEPENDENT_AMBULATORY_CARE_PROVIDER_SITE_OTHER): Payer: Medicare Other | Admitting: Bariatrics

## 2019-06-08 VITALS — BP 121/77 | HR 85 | Temp 97.9°F | Ht 65.0 in | Wt 225.0 lb

## 2019-06-08 DIAGNOSIS — R748 Abnormal levels of other serum enzymes: Secondary | ICD-10-CM | POA: Diagnosis not present

## 2019-06-08 DIAGNOSIS — E66812 Obesity, class 2: Secondary | ICD-10-CM

## 2019-06-08 DIAGNOSIS — R0602 Shortness of breath: Secondary | ICD-10-CM

## 2019-06-08 DIAGNOSIS — Z6837 Body mass index (BMI) 37.0-37.9, adult: Secondary | ICD-10-CM

## 2019-06-08 DIAGNOSIS — Z1331 Encounter for screening for depression: Secondary | ICD-10-CM | POA: Diagnosis not present

## 2019-06-08 DIAGNOSIS — Z0289 Encounter for other administrative examinations: Secondary | ICD-10-CM

## 2019-06-08 DIAGNOSIS — Z9189 Other specified personal risk factors, not elsewhere classified: Secondary | ICD-10-CM

## 2019-06-08 DIAGNOSIS — K219 Gastro-esophageal reflux disease without esophagitis: Secondary | ICD-10-CM

## 2019-06-08 DIAGNOSIS — K580 Irritable bowel syndrome with diarrhea: Secondary | ICD-10-CM

## 2019-06-08 DIAGNOSIS — E038 Other specified hypothyroidism: Secondary | ICD-10-CM | POA: Diagnosis not present

## 2019-06-08 DIAGNOSIS — K760 Fatty (change of) liver, not elsewhere classified: Secondary | ICD-10-CM

## 2019-06-08 DIAGNOSIS — E782 Mixed hyperlipidemia: Secondary | ICD-10-CM | POA: Diagnosis not present

## 2019-06-08 DIAGNOSIS — R7303 Prediabetes: Secondary | ICD-10-CM | POA: Diagnosis not present

## 2019-06-08 DIAGNOSIS — R5383 Other fatigue: Secondary | ICD-10-CM | POA: Diagnosis not present

## 2019-06-08 DIAGNOSIS — R7309 Other abnormal glucose: Secondary | ICD-10-CM | POA: Diagnosis not present

## 2019-06-08 NOTE — Progress Notes (Signed)
Dear Dr. Melina Schools,   Thank you for referring Katherine Hayden to our clinic. The following note includes my evaluation and treatment recommendations.  Chief Complaint:   OBESITY Katherine Hayden (MR# [id number]) is a 54 y.o. female who presents for evaluation and treatment of obesity and related comorbidities. Current BMI is Body mass index is 37.44 kg/m.[name] has been struggling with her weight for many years and has been unsuccessful in either losing weight, maintaining weight loss, or reaching her healthy weight goal.  [name] is currently in the action stage of change and ready to dedicate time achieving and maintaining a healthier weight. Aamiyah is interested in becoming our patient and working on intensive lifestyle modifications including (but not limited to) diet and exercise for weight loss.  Breeze dislikes fish. She skips breakfast and dinner most days.  [name]'s habits were reviewed today and are as follows: her desired weight loss is 45 lbs, she has been heavy most of her life, she started gaining weight when her thyroid started acting up, her heaviest weight ever was 230 pounds, she skips breakfast and dinner most days, she is frequently drinking liquids with calories, she has binge eating behaviors and she struggles with emotional eating.  Depression Screen [name]'s Food and Mood (modified PHQ-9) score was 13.  Depression screen PHQ 2/9 06/08/2019  Decreased Interest 3  Down, Depressed, Hopeless 2  PHQ - 2 Score 5  Altered sleeping 0  Tired, decreased energy 3  Change in appetite 2  Feeling bad or failure about yourself  2  Trouble concentrating 1  Moving slowly or fidgety/restless 0  Suicidal thoughts 0  PHQ-9 Score 13  Difficult doing work/chores Somewhat difficult   Subjective:   Other fatigue. [name] denies daytime somnolence and admits to waking up still tired. [name] generally gets 4 or 5 hours of sleep per night, and states that she does  not sleep well most nights. Snoring is present. Apneic episodes are not present. Epworth Sleepiness Score is 5.  Shortness of breath on exertion. [name] notes increasing shortness of breath with certain activities and seems to be worsening over time with weight gain. She notes getting out of breath sooner with activity than she used to. This has gotten worse recently. [name] denies shortness of breath at rest or orthopnea.  Gastroesophageal reflux disease, unspecified whether esophagitis present. [name] is taking Nexium. GERD is controlled with medication.  Other specified hypothyroidism. [name] is taking Synthroid. She has a history of Hashimoto's thyroiditis. TSH was 30.90 on 02/19/2019 and 0.02 on 04/22/2019.  Lab Results  Component Value Date   TSH 0.02 (A) 04/22/2019   Prediabetes. [name] has a diagnosis of prediabetes based on her elevated HgA1c and was informed this puts her at greater risk of developing diabetes. She continues to work on diet and exercise to decrease her risk of diabetes. She denies nausea or hypoglycemia. She reports having a decreased appetite.  Lab Results  Component Value Date   HGBA1C 5.9 02/19/2019   No results found for: INSULIN  Mixed hyperlipidemia. [name] is taking Lipitor.   Lab Results  Component Value Date   CHOL  12/18/2007    166        ATP III CLASSIFICATION:  <200     mg/dL   Desirable  200-239  mg/dL   Borderline High  >=240    mg/dL   High   HDL 39 (L) 12/18/2007   LDLCALC (H) 12/18/2007    108  Total Cholesterol/HDL:CHD Risk Coronary Heart Disease Risk Table                     Men   Women  1/2 Average Risk   3.4   3.3   TRIG 97 12/18/2007   CHOLHDL 4.3 12/18/2007   Lab Results  Component Value Date   ALT 40 (H) 05/07/2019   AST 26 05/07/2019   ALKPHOS 128 (H) 04/14/2017   BILITOT 0.4 05/07/2019   The ASCVD Risk score Mikey Bussing DC Jr., et al., 2013) failed to calculate for the following reasons:   Cannot find a previous  HDL lab   Cannot find a previous total cholesterol lab  Irritable bowel syndrome with diarrhea. Bettelou is taking dicyclomine. She reports no specific triggers.  Fatty liver. [name] had an echo in 2009 showing fatty liver. Last ALT was slightly elevated at 40 on 05/07/2019.  Depression screening. [name] had a moderately positive depression screen with a PHQ-9 score of 13.  At risk for diabetes mellitus. [name] is at higher than average risk for developing diabetes due to prediabetes and obesity.   Assessment/Plan:   Other fatigue. [name] does feel that her weight is causing her energy to be lower than it should be. Fatigue may be related to obesity, depression or many other causes. Labs will be ordered, and in the meanwhile, [name] will focus on self care including making healthy food choices, increasing physical activity and focusing on stress reduction. EKG 12-Lead, VITAMIN D 25 Hydroxy (Vit-D Deficiency, Fractures)  Shortness of breath on exertion. [name] does feel that she gets out of breath more easily that she used to when she exercises. [name]'s shortness of breath appears to be obesity related and exercise induced. She has agreed to work on weight loss and gradually increase exercise to treat her exercise induced shortness of breath. Will continue to monitor closely.  Gastroesophageal reflux disease, unspecified whether esophagitis present. Intensive lifestyle modifications are the first line treatment for this issue. We discussed several lifestyle modifications today and she will continue to work on diet, exercise and weight loss efforts. Orders and follow up as documented in patient record. [name] will continue Nexium as directed.  Counseling . If a person has gastroesophageal reflux disease (GERD), food and stomach acid move back up into the esophagus and cause symptoms or problems such as damage to the esophagus. . Anti-reflux measures include: raising the head of the bed, avoiding  tight clothing or belts, avoiding eating late at night, not lying down shortly after mealtime, and achieving weight loss. . Avoid ASA, NSAID's, caffeine, alcohol, and tobacco.  . OTC Pepcid and/or Tums are often very helpful for as needed use.  Marland Kitchen However, for persisting chronic or daily symptoms, stronger medications like Omeprazole may be needed. . You may need to avoid foods and drinks such as: ? Coffee and tea (with or without caffeine). ? Drinks that contain alcohol. ? Energy drinks and sports drinks. ? Bubbly (carbonated) drinks or sodas. ? Chocolate and cocoa. ? Peppermint and mint flavorings. ? Garlic and onions. ? Horseradish. ? Spicy and acidic foods. These include peppers, chili powder, curry powder, vinegar, hot sauces, and BBQ sauce. ? Citrus fruit juices and citrus fruits, such as oranges, lemons, and limes. ? Tomato-based foods. These include red sauce, chili, salsa, and pizza with red sauce. ? Fried and fatty foods. These include donuts, french fries, potato chips, and high-fat dressings. ? High-fat meats. These include hot dogs, rib eye steak, sausage,  ham, and bacon.  Other specified hypothyroidism. Patient with long-standing hypothyroidism, on levothyroxine therapy. She appears euthyroid. Orders and follow up as documented in patient record. T4, free, TSH labs ordered.  Counseling . Good thyroid control is important for overall health. Supratherapeutic thyroid levels are dangerous and will not improve weight loss results. . The correct way to take levothyroxine is fasting, with water, separated by at least 30 minutes from breakfast, and separated by more than 4 hours from calcium, iron, multivitamins, acid reflux medications (PPIs).    Prediabetes. Arnold will continue to work on weight loss, exercise, and decreasing simple carbohydrates to help decrease the risk of diabetes. Hemoglobin A1c, Insulin, random, Comprehensive metabolic panel labs ordered.  Mixed  hyperlipidemia. Cardiovascular risk and specific lipid/LDL goals reviewed.  We discussed several lifestyle modifications today and Daphane will continue to work on diet, exercise and weight loss efforts. Orders and follow up as documented in patient record. Mairen will continue medications as directed. Lipid Panel With LDL/HDL Ratio labs ordered.  Counseling Intensive lifestyle modifications are the first line treatment for this issue. . Dietary changes: Increase soluble fiber. Decrease simple carbohydrates. . Exercise changes: Moderate to vigorous-intensity aerobic activity 150 minutes per week if tolerated. . Lipid-lowering medications: see documented in medical record.   Irritable bowel syndrome with diarrhea. Kyla will continue medications as directed.  Fatty liver. Haisley was informed that losing at least 5-10% of her body weight would be beneficial for fatty liver disease. She will increase cardio and resistance activities.   Depression screening. Brylyn had a positive depression screening. Depression is commonly associated with obesity and often results in emotional eating behaviors. We will monitor this closely and work on CBT to help improve the non-hunger eating patterns. Referral to Psychology may be required if no improvement is seen as she continues in our clinic.  At risk for diabetes mellitus. Alilyana was given approximately 15 minutes of diabetes education and counseling today. We discussed intensive lifestyle modifications today with an emphasis on weight loss as well as increasing exercise and decreasing simple carbohydrates in her diet. We also reviewed medication options with an emphasis on risk versus benefit of those discussed.   Repetitive spaced learning was employed today to elicit superior memory formation and behavioral change.  Class 2 severe obesity with serious comorbidity and body mass index (BMI) of 37.0 to 37.9 in adult, unspecified obesity type ([place]).  Arelene is  currently in the action stage of change and her goal is to continue with weight loss efforts. I recommend Esteban begin the structured treatment plan as follows:  She has agreed to the Category 3 Plan.  We independently reviewed labs from 05/07/2019 with the patient including AST, ALT, alkaline phosphatase, BMP, CBC, and TSH.  She will work on meal planning and will stop all sugary drinks.  Exercise goals: All adults should avoid inactivity. Some physical activity is better than none, and adults who participate in any amount of physical activity gain some health benefits.   Behavioral modification strategies: increasing lean protein intake, decreasing simple carbohydrates, increasing vegetables, increasing water intake, decreasing eating out, no skipping meals, meal planning and cooking strategies, keeping healthy foods in the home and planning for success.  She was informed of the importance of frequent follow-up visits to maximize her success with intensive lifestyle modifications for her multiple health conditions. She was informed we would discuss her lab results at her next visit unless there is a critical issue that needs to be addressed sooner.  Priyanshi agreed to keep her next visit at the agreed upon time to discuss these results.  Objective:   Blood pressure 121/77, pulse 85, temperature 97.9 F (36.6 C), height 5\' 5"  (1.651 m), weight 225 lb (102.1 kg), last menstrual period 12/15/2013, SpO2 96 %. Body mass index is 37.44 kg/m.  EKG: Sinus  Rhythm with a rate of 85 BPM. Low voltage in precordial leads. RSR(V1) - nondiagnostic. Otherwise normal.  Indirect Calorimeter completed today shows a VO2 of 288 and a REE of 2006.  Her calculated basal metabolic rate is [id number] thus her basal metabolic rate is better than expected.  General: Cooperative, alert, well developed, in no acute distress. HEENT: Conjunctivae and lids unremarkable. Cardiovascular: Regular rhythm.  Lungs: Normal work of  breathing. Neurologic: No focal deficits.   Lab Results  Component Value Date   CREATININE 0.8 02/19/2019   BUN 10 02/19/2019   NA 146 02/19/2019   K 4.6 02/19/2019   CL 104 02/19/2019   CO2 29 (A) 02/19/2019   Lab Results  Component Value Date   ALT 40 (H) 05/07/2019   AST 26 05/07/2019   ALKPHOS 128 (H) 04/14/2017   BILITOT 0.4 05/07/2019   Lab Results  Component Value Date   HGBA1C 5.9 02/19/2019   HGBA1C  12/18/2007    5.5 (NOTE)   The ADA recommends the following therapeutic goal for glycemic   control related to Hgb A1C measurement:   Goal of Therapy:   < 7.0% Hgb A1C   Reference: American Diabetes Association: Clinical Practice   Recommendations 2008, Diabetes Care,  2008, 31:(Suppl 1).   No results found for: INSULIN Lab Results  Component Value Date   TSH 0.02 (A) 04/22/2019   Lab Results  Component Value Date   CHOL  12/18/2007    166        ATP III CLASSIFICATION:  <200     mg/dL   Desirable  200-239  mg/dL   Borderline High  >=240    mg/dL   High   HDL 39 (L) 12/18/2007   LDLCALC (H) 12/18/2007    108        Total Cholesterol/HDL:CHD Risk Coronary Heart Disease Risk Table                     Men   Women  1/2 Average Risk   3.4   3.3   TRIG 97 12/18/2007   CHOLHDL 4.3 12/18/2007   Lab Results  Component Value Date   WBC 11.7 02/19/2019   HGB 13.8 02/19/2019   HCT 42 02/19/2019   MCV 97.0 12/16/2018   PLT 266 02/19/2019   No results found for: IRON, TIBC, FERRITIN  Attestation Statements:   Reviewed by clinician on day of visit: allergies, medications, problem list, medical history, surgical history, family history, social history, and previous encounter notes.  Migdalia Dk, am acting as Location manager for [employer], DO   I have reviewed the above documentation for accuracy and completeness, and I agree with the above. Jearld Lesch, DO

## 2019-06-09 ENCOUNTER — Encounter (HOSPITAL_COMMUNITY): Payer: Self-pay

## 2019-06-09 ENCOUNTER — Other Ambulatory Visit: Payer: Self-pay

## 2019-06-09 ENCOUNTER — Encounter (INDEPENDENT_AMBULATORY_CARE_PROVIDER_SITE_OTHER): Payer: Self-pay | Admitting: Bariatrics

## 2019-06-09 DIAGNOSIS — E559 Vitamin D deficiency, unspecified: Secondary | ICD-10-CM | POA: Insufficient documentation

## 2019-06-09 LAB — COMPREHENSIVE METABOLIC PANEL
ALT: 32 IU/L (ref 0–32)
AST: 23 IU/L (ref 0–40)
Albumin/Globulin Ratio: 1.7 (ref 1.2–2.2)
Albumin: 4 g/dL (ref 3.8–4.9)
Alkaline Phosphatase: 185 IU/L — ABNORMAL HIGH (ref 39–117)
BUN/Creatinine Ratio: 16 (ref 9–23)
BUN: 12 mg/dL (ref 6–24)
Bilirubin Total: 0.3 mg/dL (ref 0.0–1.2)
CO2: 23 mmol/L (ref 20–29)
Calcium: 9 mg/dL (ref 8.7–10.2)
Chloride: 106 mmol/L (ref 96–106)
Creatinine, Ser: 0.73 mg/dL (ref 0.57–1.00)
GFR calc Af Amer: 106 mL/min/{1.73_m2} (ref >59)
GFR calc non Af Amer: 92 mL/min/{1.73_m2} (ref >59)
Globulin, Total: 2.4 g/dL (ref 1.5–4.5)
Glucose: 94 mg/dL (ref 65–99)
Potassium: 4.1 mmol/L (ref 3.5–5.2)
Sodium: 143 mmol/L (ref 134–144)
Total Protein: 6.4 g/dL (ref 6.0–8.5)

## 2019-06-09 LAB — HEMOGLOBIN A1C
Est. average glucose Bld gHb Est-mCnc: 117 mg/dL
Hgb A1c MFr Bld: 5.7 % — ABNORMAL HIGH (ref 4.8–5.6)

## 2019-06-09 LAB — LIPID PANEL WITH LDL/HDL RATIO
Cholesterol, Total: 134 mg/dL (ref 100–199)
HDL: 37 mg/dL — ABNORMAL LOW (ref >39)
LDL Chol Calc (NIH): 72 mg/dL (ref 0–99)
LDL/HDL Ratio: 1.9 ratio (ref 0.0–3.2)
Triglycerides: 144 mg/dL (ref 0–149)
VLDL Cholesterol Cal: 25 mg/dL (ref 5–40)

## 2019-06-09 LAB — TSH: TSH: 0.052 u[IU]/mL — ABNORMAL LOW (ref 0.450–4.500)

## 2019-06-09 LAB — T4, FREE: Free T4: 1.72 ng/dL (ref 0.82–1.77)

## 2019-06-09 LAB — INSULIN, RANDOM: INSULIN: 14 u[IU]/mL (ref 2.6–24.9)

## 2019-06-09 LAB — VITAMIN D 25 HYDROXY (VIT D DEFICIENCY, FRACTURES): Vit D, 25-Hydroxy: 11.3 ng/mL — ABNORMAL LOW (ref 30.0–100.0)

## 2019-06-09 LAB — T3: T3, Total: 98 ng/dL (ref 71–180)

## 2019-06-10 ENCOUNTER — Other Ambulatory Visit (HOSPITAL_COMMUNITY)
Admission: RE | Admit: 2019-06-10 | Discharge: 2019-06-10 | Disposition: A | Discharge status: Home / Self Care | Payer: Medicare Other | Source: Ambulatory Visit | Attending: Internal Medicine | Admitting: Internal Medicine

## 2019-06-10 ENCOUNTER — Encounter (HOSPITAL_COMMUNITY)
Admission: RE | Admit: 2019-06-10 | Discharge: 2019-06-10 | Disposition: A | Discharge status: Home / Self Care | Payer: Medicare Other | Source: Ambulatory Visit | Attending: Internal Medicine | Admitting: Internal Medicine

## 2019-06-10 DIAGNOSIS — Z01812 Encounter for preprocedural laboratory examination: Secondary | ICD-10-CM | POA: Insufficient documentation

## 2019-06-10 DIAGNOSIS — M25522 Pain in left elbow: Secondary | ICD-10-CM | POA: Diagnosis not present

## 2019-06-10 DIAGNOSIS — Z20822 Contact with and (suspected) exposure to covid-19: Secondary | ICD-10-CM | POA: Insufficient documentation

## 2019-06-10 DIAGNOSIS — M7710 Lateral epicondylitis, unspecified elbow: Secondary | ICD-10-CM | POA: Diagnosis not present

## 2019-06-10 LAB — SARS CORONAVIRUS 2 (TAT 6-24 HRS): SARS Coronavirus 2: NEGATIVE

## 2019-06-12 ENCOUNTER — Encounter (HOSPITAL_COMMUNITY): Payer: Self-pay | Admitting: Internal Medicine

## 2019-06-12 ENCOUNTER — Ambulatory Visit (HOSPITAL_COMMUNITY)
Admission: RE | Admit: 2019-06-12 | Discharge: 2019-06-12 | Disposition: A | Discharge status: Home / Self Care | Payer: Medicare Other | Attending: Internal Medicine | Admitting: Internal Medicine

## 2019-06-12 ENCOUNTER — Encounter (HOSPITAL_COMMUNITY)
Admission: RE | Disposition: A | Discharge status: Home / Self Care | Payer: Self-pay | Source: Home / Self Care | Attending: Internal Medicine

## 2019-06-12 ENCOUNTER — Other Ambulatory Visit: Payer: Self-pay

## 2019-06-12 ENCOUNTER — Ambulatory Visit (HOSPITAL_COMMUNITY): Payer: Medicare Other | Admitting: Anesthesiology

## 2019-06-12 DIAGNOSIS — K580 Irritable bowel syndrome with diarrhea: Secondary | ICD-10-CM | POA: Insufficient documentation

## 2019-06-12 DIAGNOSIS — F419 Anxiety disorder, unspecified: Secondary | ICD-10-CM | POA: Insufficient documentation

## 2019-06-12 DIAGNOSIS — K635 Polyp of colon: Secondary | ICD-10-CM | POA: Diagnosis not present

## 2019-06-12 DIAGNOSIS — J439 Emphysema, unspecified: Secondary | ICD-10-CM | POA: Insufficient documentation

## 2019-06-12 DIAGNOSIS — D126 Benign neoplasm of colon, unspecified: Secondary | ICD-10-CM | POA: Diagnosis not present

## 2019-06-12 DIAGNOSIS — E039 Hypothyroidism, unspecified: Secondary | ICD-10-CM | POA: Diagnosis not present

## 2019-06-12 DIAGNOSIS — D122 Benign neoplasm of ascending colon: Secondary | ICD-10-CM | POA: Diagnosis not present

## 2019-06-12 DIAGNOSIS — Z1211 Encounter for screening for malignant neoplasm of colon: Secondary | ICD-10-CM | POA: Diagnosis not present

## 2019-06-12 DIAGNOSIS — K219 Gastro-esophageal reflux disease without esophagitis: Secondary | ICD-10-CM | POA: Diagnosis not present

## 2019-06-12 DIAGNOSIS — Z79890 Hormone replacement therapy (postmenopausal): Secondary | ICD-10-CM | POA: Diagnosis not present

## 2019-06-12 DIAGNOSIS — Z79899 Other long term (current) drug therapy: Secondary | ICD-10-CM | POA: Insufficient documentation

## 2019-06-12 DIAGNOSIS — D125 Benign neoplasm of sigmoid colon: Secondary | ICD-10-CM | POA: Diagnosis not present

## 2019-06-12 DIAGNOSIS — F17210 Nicotine dependence, cigarettes, uncomplicated: Secondary | ICD-10-CM | POA: Insufficient documentation

## 2019-06-12 DIAGNOSIS — G2581 Restless legs syndrome: Secondary | ICD-10-CM | POA: Insufficient documentation

## 2019-06-12 DIAGNOSIS — F329 Major depressive disorder, single episode, unspecified: Secondary | ICD-10-CM | POA: Insufficient documentation

## 2019-06-12 DIAGNOSIS — I10 Essential (primary) hypertension: Secondary | ICD-10-CM | POA: Diagnosis not present

## 2019-06-12 DIAGNOSIS — E7800 Pure hypercholesterolemia, unspecified: Secondary | ICD-10-CM | POA: Diagnosis not present

## 2019-06-12 DIAGNOSIS — J449 Chronic obstructive pulmonary disease, unspecified: Secondary | ICD-10-CM | POA: Diagnosis not present

## 2019-06-12 DIAGNOSIS — K644 Residual hemorrhoidal skin tags: Secondary | ICD-10-CM | POA: Diagnosis not present

## 2019-06-12 DIAGNOSIS — D123 Benign neoplasm of transverse colon: Secondary | ICD-10-CM | POA: Diagnosis not present

## 2019-06-12 DIAGNOSIS — Z7951 Long term (current) use of inhaled steroids: Secondary | ICD-10-CM | POA: Diagnosis not present

## 2019-06-12 HISTORY — PX: BIOPSY: SHX5522

## 2019-06-12 HISTORY — PX: POLYPECTOMY: SHX5525

## 2019-06-12 HISTORY — PX: COLONOSCOPY WITH PROPOFOL: SHX5780

## 2019-06-12 SURGERY — COLONOSCOPY WITH PROPOFOL
Anesthesia: General

## 2019-06-12 MED ORDER — CHLORHEXIDINE GLUCONATE CLOTH 2 % EX PADS: 6.0000 | MEDICATED_PAD | Freq: Once | CUTANEOUS | Status: DC

## 2019-06-12 MED ORDER — KETAMINE HCL 50 MG/5ML IJ SOSY
PREFILLED_SYRINGE | INTRAMUSCULAR | Status: AC
  Filled 2019-06-12: qty 5

## 2019-06-12 MED ORDER — GLYCOPYRROLATE 0.2 MG/ML IJ SOLN
INTRAMUSCULAR | Status: DC | PRN
  Administered 2019-06-12: .1 mg via INTRAVENOUS

## 2019-06-12 MED ORDER — PROPOFOL 10 MG/ML IV BOLUS
INTRAVENOUS | Status: DC | PRN
  Administered 2019-06-12: 20 mg via INTRAVENOUS

## 2019-06-12 MED ORDER — GLYCOPYRROLATE PF 0.2 MG/ML IJ SOSY
PREFILLED_SYRINGE | INTRAMUSCULAR | Status: AC
  Filled 2019-06-12: qty 1

## 2019-06-12 MED ORDER — LACTATED RINGERS IV SOLN: Freq: Once | INTRAVENOUS | Status: AC

## 2019-06-12 MED ORDER — PROPOFOL 500 MG/50ML IV EMUL
INTRAVENOUS | Status: DC | PRN
  Administered 2019-06-12: 125 ug/kg/min via INTRAVENOUS

## 2019-06-12 MED ORDER — KETAMINE HCL 10 MG/ML IJ SOLN
INTRAMUSCULAR | Status: DC | PRN
  Administered 2019-06-12: 40 mg via INTRAVENOUS
  Administered 2019-06-12: 10 mg via INTRAVENOUS

## 2019-06-12 MED ORDER — STERILE WATER FOR IRRIGATION IR SOLN
Status: DC | PRN
  Administered 2019-06-12: 2.5 mL

## 2019-06-12 MED ORDER — LIDOCAINE 2% (20 MG/ML) 5 ML SYRINGE
INTRAMUSCULAR | Status: AC
  Filled 2019-06-12: qty 5

## 2019-06-12 NOTE — H&P (Signed)
Katherine Hayden is an 54 y.o. female.   Chief Complaint: Patient is here for colonoscopy. HPI: Patient is 54 year old Caucasian female who is here for screening colonoscopy.  Her last exam was in 2009.  She denies rectal bleeding.  She has a history of intermittent diarrhea triggered by certain foods as well as LLQ abdominal pain and felt to be due to IBS.  She was recently seen in the office and begun on dicyclomine and she feels it is helping some.  No history of nocturnal bowel movement or weight loss.  Family history is negative for CRC or inflammatory bowel disease.  Past Medical History:  Diagnosis Date  . Anxiety   . Arthritis   . Asthma   . Back pain   . Cervicogenic headache 08/20/2016  . Chronic headaches   . Chronic pain   . Depression   . Emphysema   . Emphysema of lung ([place])   . Fatty liver   . GERD (gastroesophageal reflux disease)   . High cholesterol   . Hypertension   . Hypothyroidism   . IBS (irritable bowel syndrome)   . Joint pain   . RLS (restless legs syndrome)   . Thyroid disease     Past Surgical History:  Procedure Laterality Date  . ANTERIOR CERVICAL DECOMP/DISCECTOMY FUSION N/A 12/17/2018   Procedure: ANTERIOR CERVICAL DECOMPRESSION/DISCECTOMY FUSION CERVIAL FIVE THROUGH SIX;  Surgeon: Melina Schools, MD;  Location: [facility];  Service: Orthopedics;  Laterality: N/A;  2.5 hrs  . APPENDECTOMY    . BACK SURGERY     x4  . CHOLECYSTECTOMY  2010  . COLONOSCOPY W/ POLYPECTOMY  07/2013   1 polyp removal  . CYST REMOVAL HAND  2015  . EXCISION MASS UPPER EXTREMETIES Left 02/25/2017   Procedure: EXCISION MASS UPPER EXTREMETIES, LEFT PALM;  Surgeon: Leanora Cover, MD;  Location: [facility];  Service: Orthopedics;  Laterality: Left;    Family History  Problem Relation Age of Onset  . Hypertension Mother   . Depression Mother   . Anxiety disorder Mother   . Hypertension Father   . Heart disease Maternal Grandfather   . Heart disease Paternal  Grandmother    Social History:  reports that she has been smoking cigarettes. She has a 4.00 pack-year smoking history. She has never used smokeless tobacco. She reports current alcohol use. She reports that she does not use drugs.  Allergies:  Allergies  Allergen Reactions  . Pneumococcal Vaccines Itching and Other (See Comments)    Patient received Influenza and Pneumococcal vaccines at the same time-caused severe itching and needs medication to reverse reaction, this caused her to go into cardiac arrest at that time.    Marland Kitchen Amoxicillin Hives    Did it involve swelling of the face/tongue/throat, SOB, or low BP? Yes Did it involve sudden or severe rash/hives, skin peeling, or any reaction on the inside of your mouth or nose? Yes Did you need to seek medical attention at a hospital or doctor's office? Unknown When did it last happen? 6-7 years ago If all above answers are "NO", may proceed with cephalosporin use.   . Codeine Nausea And Vomiting  . Hydromorphone Hcl Nausea And Vomiting  . Sulfonamide Derivatives Hives  . Morphine And Related Hives    Medications Prior to Admission  Medication Sig Dispense Refill  . ALPRAZolam (XANAX) 1 MG tablet Take 1 mg by mouth 4 (four) times daily as needed for anxiety.     Marland Kitchen atorvastatin (LIPITOR)  20 MG tablet Take 20 mg by mouth at bedtime.    . baclofen (LIORESAL) 10 MG tablet Take 10 mg by mouth in the morning and at bedtime.    . betamethasone dipropionate 0.05 % cream Apply 1 application topically 2 (two) times daily as needed.    . CHANTIX 1 MG tablet Take 1 mg by mouth daily.    Marland Kitchen dicyclomine (BENTYL) 20 MG tablet Take 20 mg by mouth 3 (three) times daily before meals.   2  . escitalopram (LEXAPRO) 20 MG tablet Take 20 mg by mouth daily.    Marland Kitchen esomeprazole (NEXIUM) 40 MG capsule Take 40 mg by mouth in the morning and at bedtime.     . Fluticasone-Salmeterol (ADVAIR) 250-50 MCG/DOSE AEPB Inhale 1 puff into the lungs 2 (two) times daily.     Marland Kitchen HYDROcodone-acetaminophen (NORCO) 10-325 MG tablet Take 1 tablet by mouth every 6 (six) hours as needed (pain.).     Marland Kitchen methocarbamol (ROBAXIN) 500 MG tablet Take 500 mg by mouth 3 (three) times daily as needed for spasms.    . montelukast (SINGULAIR) 10 MG tablet Take 10 mg by mouth at bedtime.    . nicotine (NICODERM CQ - DOSED IN MG/24 HOURS) 21 mg/24hr patch Place 21 mg onto the skin daily.    . ondansetron (ZOFRAN) 4 MG tablet Take 1 tablet (4 mg total) by mouth every 8 (eight) hours as needed for nausea or vomiting. 20 tablet 0  . PROAIR HFA 108 (90 BASE) MCG/ACT inhaler Inhale 2 puffs into the lungs every 4 (four) hours as needed for wheezing or shortness of breath.     . QUEtiapine (SEROQUEL) 300 MG tablet Take 300 mg by mouth at bedtime.    . RESTASIS 0.05 % ophthalmic emulsion Place 1 drop into both eyes 2 (two) times daily.    Marland Kitchen rOPINIRole (REQUIP) 4 MG tablet Take 4 mg by mouth at bedtime.    Marland Kitchen SYNTHROID 175 MCG tablet Take 1 tablet (175 mcg total) by mouth daily before breakfast. 30 tablet 3  . valACYclovir (VALTREX) 1000 MG tablet Take 1,000 mg by mouth 2 (two) times daily as needed (cold sores.).      No results found for this or any previous visit (from the past 48 hour(s)). No results found.  Review of Systems  Blood pressure 134/83, pulse 80, temperature 97.8 F (36.6 C), temperature source Oral, resp. rate (!) 98, height 5\' 6"  (1.676 m), weight 105.2 kg, last menstrual period 12/15/2013, SpO2 98 %. Physical Exam  Constitutional: She appears well-developed and well-nourished.  HENT:  Mouth/Throat: Oropharynx is clear and moist.  Eyes: Conjunctivae are normal. No scleral icterus.  Neck: No thyromegaly present.  Cardiovascular: Normal rate, regular rhythm and normal heart sounds.  No murmur heard. Respiratory: Effort normal.  GI:  Abdomen is full.  On palpation is soft.  She has mild generalized tenderness without guarding or rebound.  No organomegaly or masses.   Musculoskeletal:        General: No edema.  Lymphadenopathy:    She has no cervical adenopathy.  Neurological: She is alert.  Skin: Skin is warm.  She has small tattoo above right lateral malleolus.     Assessment/Plan Average risk screening colonoscopy. Chronic/intermittent diarrhea appears to be due to IBS.  Hildred Laser, MD 06/12/2019, 8:16 AM

## 2019-06-12 NOTE — Transfer of Care (Signed)
Immediate Anesthesia Transfer of Care Note  Patient: Katherine Hayden  Procedure(s) Performed: COLONOSCOPY WITH PROPOFOL (N/A ) POLYPECTOMY BIOPSY  Patient Location: PACU  Anesthesia Type:General  Level of Consciousness: awake  Airway & Oxygen Therapy: Patient Spontanous Breathing  Post-op Assessment: Report given to RN and Post -op Vital signs reviewed and stable  Post vital signs: Reviewed and stable  Last Vitals:  Vitals Value Taken Time  BP    Temp    Pulse 80 06/12/19 0911  Resp 17 06/12/19 0911  SpO2 96 % 06/12/19 0911  Vitals shown include unvalidated device data.  Last Pain:  Vitals:   06/12/19 0822  TempSrc:   PainSc: 8       Patients Stated Pain Goal: 7 ([id number])  Complications: No apparent anesthesia complications

## 2019-06-12 NOTE — Discharge Instructions (Signed)
No aspirin or NSAIDs for 24 hours. Resume usual medications and diet as before. No driving for 24 hours. Physician will call with biopsy results.      Colonoscopy, Adult, Care After This sheet gives you information about how to care for yourself after your procedure. Your doctor may also give you more specific instructions. If you have problems or questions, call your doctor. What can I expect after the procedure? After the procedure, it is common to have:  A small amount of blood in your poop (stool) for 24 hours.  Some gas.  Mild cramping or bloating in your belly (abdomen). Follow these instructions at home: Eating and drinking   Drink enough fluid to keep your pee (urine) pale yellow.  Follow instructions from your doctor about what you cannot eat or drink.  Return to your normal diet as told by your doctor. Avoid heavy or fried foods that are hard to digest. Activity  Rest as told by your doctor.  Do not sit for a long time without moving. Get up to take short walks every 1-2 hours. This is important. Ask for help if you feel weak or unsteady.  Return to your normal activities as told by your doctor. Ask your doctor what activities are safe for you. To help cramping and bloating:   Try walking around.  Put heat on your belly as told by your doctor. Use the heat source that your doctor recommends, such as a moist heat pack or a heating pad. ? Put a towel between your skin and the heat source. ? Leave the heat on for 20-30 minutes. ? Remove the heat if your skin turns bright red. This is very important if you are unable to feel pain, heat, or cold. You may have a greater risk of getting burned. General instructions  For the first 24 hours after the procedure: ? Do not drive or use machinery. ? Do not sign important documents. ? Do not drink alcohol. ? Do your daily activities more slowly than normal. ? Eat foods that are soft and easy to digest.  Take  over-the-counter or prescription medicines only as told by your doctor.  Keep all follow-up visits as told by your doctor. This is important. Contact a doctor if:  You have blood in your poop 2-3 days after the procedure. Get help right away if:  You have more than a small amount of blood in your poop.  You see large clumps of tissue (blood clots) in your poop.  Your belly is swollen.  You feel like you may vomit (nauseous).  You vomit.  You have a fever.  You have belly pain that gets worse, and medicine does not help your pain. Summary  After the procedure, it is common to have a small amount of blood in your poop. You may also have mild cramping and bloating in your belly.  For the first 24 hours after the procedure, do not drive or use machinery, do not sign important documents, and do not drink alcohol.  Get help right away if you have a lot of blood in your poop, feel like you may vomit, have a fever, or have more belly pain. This information is not intended to replace advice given to you by your health care provider. Make sure you discuss any questions you have with your health care provider. Document Revised: 10/20/2018 Document Reviewed: 10/20/2018 Elsevier Patient Education  [place].   Colon Polyps  Polyps are tissue growths inside  the body. Polyps can grow in many places, including the large intestine (colon). A polyp may be a round bump or a mushroom-shaped growth. You could have one polyp or several. Most colon polyps are noncancerous (benign). However, some colon polyps can become cancerous over time. Finding and removing the polyps early can help prevent this. What are the causes? The exact cause of colon polyps is not known. What increases the risk? You are more likely to develop this condition if you:  Have a family history of colon cancer or colon polyps.  Are older than 38 or older than 45 if you are African American.  Have inflammatory  bowel disease, such as ulcerative colitis or Crohn's disease.  Have certain hereditary conditions, such as: ? Familial adenomatous polyposis. ? Lynch syndrome. ? Turcot syndrome. ? Peutz-Jeghers syndrome.  Are overweight.  Smoke cigarettes.  Do not get enough exercise.  Drink too much alcohol.  Eat a diet that is high in fat and red meat and low in fiber.  Had childhood cancer that was treated with abdominal radiation. What are the signs or symptoms? Most polyps do not cause symptoms. If you have symptoms, they may include:  Blood coming from your rectum when having a bowel movement.  Blood in your stool. The stool may look dark red or black.  Abdominal pain.  A change in bowel habits, such as constipation or diarrhea. How is this diagnosed? This condition is diagnosed with a colonoscopy. This is a procedure in which a lighted, flexible scope is inserted into the anus and then passed into the colon to examine the area. Polyps are sometimes found when a colonoscopy is done as part of routine cancer screening tests. How is this treated? Treatment for this condition involves removing any polyps that are found. Most polyps can be removed during a colonoscopy. Those polyps will then be tested for cancer. Additional treatment may be needed depending on the results of testing. Follow these instructions at home: Lifestyle  Maintain a healthy weight, or lose weight if recommended by your health care provider.  Exercise every day or as told by your health care provider.  Do not use any products that contain nicotine or tobacco, such as cigarettes and e-cigarettes. If you need help quitting, ask your health care provider.  If you drink alcohol, limit how much you have: ? 0-1 drink a day for women. ? 0-2 drinks a day for men.  Be aware of how much alcohol is in your drink. In the [place] one drink equals one 12 oz bottle of beer (355 mL), one 5 oz glass of wine (148 mL), or one 1 oz  shot of hard liquor (44 mL). Eating and drinking   Eat foods that are high in fiber, such as fruits, vegetables, and whole grains.  Eat foods that are high in calcium and vitamin D, such as milk, cheese, yogurt, eggs, liver, fish, and broccoli.  Limit foods that are high in fat, such as fried foods and desserts.  Limit the amount of red meat and processed meat you eat, such as hot dogs, sausage, bacon, and lunch meats. General instructions  Keep all follow-up visits as told by your health care provider. This is important. ? This includes having regularly scheduled colonoscopies. ? Talk to your health care provider about when you need a colonoscopy. Contact a health care provider if:  You have new or worsening bleeding during a bowel movement.  You have new or increased blood in your  stool.  You have a change in bowel habits.  You lose weight for no known reason. Summary  Polyps are tissue growths inside the body. Polyps can grow in many places, including the colon.  Most colon polyps are noncancerous (benign), but some can become cancerous over time.  This condition is diagnosed with a colonoscopy.  Treatment for this condition involves removing any polyps that are found. Most polyps can be removed during a colonoscopy. This information is not intended to replace advice given to you by your health care provider. Make sure you discuss any questions you have with your health care provider. Document Revised: 07/11/2017 Document Reviewed: 07/11/2017 Elsevier Patient Education  [place] After These instructions provide you with information about caring for yourself after your procedure. Your health care provider may also give you more specific instructions. Your treatment has been planned according to current medical practices, but problems sometimes occur. Call your health care provider if you have any problems or questions after your  procedure. What can I expect after the procedure? After your procedure, you may:  Feel sleepy for several hours.  Feel clumsy and have poor balance for several hours.  Feel forgetful about what happened after the procedure.  Have poor judgment for several hours.  Feel nauseous or vomit.  Have a sore throat if you had a breathing tube during the procedure. Follow these instructions at home: For at least 24 hours after the procedure:      Have a responsible adult stay with you. It is important to have someone help care for you until you are awake and alert.  Rest as needed.  Do not: ? Participate in activities in which you could fall or become injured. ? Drive. ? Use heavy machinery. ? Drink alcohol. ? Take sleeping pills or medicines that cause drowsiness. ? Make important decisions or sign legal documents. ? Take care of children on your own. Eating and drinking  Follow the diet that is recommended by your health care provider.  If you vomit, drink water, juice, or soup when you can drink without vomiting.  Make sure you have little or no nausea before eating solid foods. General instructions  Take over-the-counter and prescription medicines only as told by your health care provider.  If you have sleep apnea, surgery and certain medicines can increase your risk for breathing problems. Follow instructions from your health care provider about wearing your sleep device: ? Anytime you are sleeping, including during daytime naps. ? While taking prescription pain medicines, sleeping medicines, or medicines that make you drowsy.  If you smoke, do not smoke without supervision.  Keep all follow-up visits as told by your health care provider. This is important. Contact a health care provider if:  You keep feeling nauseous or you keep vomiting.  You feel light-headed.  You develop a rash.  You have a fever. Get help right away if:  You have trouble  breathing. Summary  For several hours after your procedure, you may feel sleepy and have poor judgment.  Have a responsible adult stay with you for at least 24 hours or until you are awake and alert. This information is not intended to replace advice given to you by your health care provider. Make sure you discuss any questions you have with your health care provider. Document Revised: 06/24/2017 Document Reviewed: 07/17/2015 Elsevier Patient Education  [place].

## 2019-06-12 NOTE — Op Note (Signed)
[facility] Patient Name: Katherine Hayden Procedure Date: 06/12/2019 8:07 AM MRN: [id number] Date of Birth: June 07, 1965 Attending MD: Hildred Laser , MD CSN: [id number] Age: 54 Admit Type: Outpatient Procedure:                Colonoscopy Indications:              Screening for colorectal malignant neoplasm Providers:                Hildred Laser, MD, Lurline Del, RN, Raphael Gibney,                            Technician Referring MD:             Crissie Sickles. Nadara Mustard, MD Medicines:                Propofol per Anesthesia Complications:            No immediate complications. Estimated Blood Loss:     Estimated blood loss was minimal. Procedure:                Pre-Anesthesia Assessment:                           - Prior to the procedure, a History and Physical                            was performed, and patient medications and                            allergies were reviewed. The patient's tolerance of                            previous anesthesia was also reviewed. The risks                            and benefits of the procedure and the sedation                            options and risks were discussed with the patient.                            All questions were answered, and informed consent                            was obtained. Prior Anticoagulants: The patient has                            taken no previous anticoagulant or antiplatelet                            agents. ASA Grade Assessment: III - A patient with                            severe systemic disease. After reviewing the risks  and benefits, the patient was deemed in                            satisfactory condition to undergo the procedure.                           After obtaining informed consent, the colonoscope                            was passed under direct vision. Throughout the                            procedure, the patient's blood pressure, pulse, and            oxygen saturations were monitored continuously. The                            [id number]) was introduced through the                            anus and advanced to the the cecum, identified by                            appendiceal orifice and ileocecal valve. The                            colonoscopy was technically difficult and complex                            due to a redundant colon and significant looping.                            Successful completion of the procedure was aided by                            using manual pressure, withdrawing and reinserting                            the scope and scope guide. The patient tolerated                            the procedure well. The quality of the bowel                            preparation was good. The ileocecal valve,                            appendiceal orifice, and rectum were photographed. Scope In: 8:26:48 AM Scope Out: 9:01:45 AM Scope Withdrawal Time: 0 hours 20 minutes 29 seconds  Total Procedure Duration: 0 hours 34 minutes 57 seconds  Findings:      The perianal and digital rectal examinations were normal.      Two polyps were found in the hepatic flexure and ascending colon. The       polyps were small in  size. These were biopsied with a cold forceps for       histology. The pathology specimen was placed into Bottle Number 1.      Two polyps were found in the splenic flexure and hepatic flexure. The       polyps were 5 mm in size. These polyps were removed with a cold snare.       Resection and retrieval were complete. The pathology specimen was placed       into Bottle Number 1.      Normal mucosa was found in the entire colon. Biopsies for histology were       taken with a cold forceps from the sigmoid colon for evaluation of       microscopic colitis. The pathology specimen was placed into Bottle       Number 2.      External hemorrhoids were found during retroflexion. The hemorrhoids        were small. Impression:               - Two small polyps at the hepatic flexure and in                            the ascending colon. Biopsied.                           - Two 5 mm polyps at the splenic flexure and at the                            hepatic flexure, removed with a cold snare.                            Resected and retrieved.                           - Normal mucosa in the entire examined colon.                            Biopsied.                           - External hemorrhoids. Moderate Sedation:      Per Anesthesia Care Recommendation:           - Patient has a contact number available for                            emergencies. The signs and symptoms of potential                            delayed complications were discussed with the                            patient. Return to normal activities tomorrow.                            Written discharge instructions were provided to the  patient.                           - Resume previous diet today.                           - Continue present medications.                           - No aspirin, ibuprofen, naproxen, or other                            non-steroidal anti-inflammatory drugs for 1 day.                           - Await pathology results.                           - Repeat colonoscopy is recommended. The                            colonoscopy date will be determined after pathology                            results from today's exam become available for                            review. Procedure Code(s):        --- Professional ---                           [id number], Colonoscopy, flexible; with removal of                            tumor(s), polyp(s), or other lesion(s) by snare                            technique                           45380, 22, Colonoscopy, flexible; with biopsy,                            single or multiple Diagnosis Code(s):        ---  Professional ---                           Z12.11, Encounter for screening for malignant                            neoplasm of colon                           K63.5, Polyp of colon                           K64.4, Residual hemorrhoidal skin tags CPT copyright 2019 American Medical Association. All rights reserved. The  codes documented in this report are preliminary and upon coder review may  be revised to meet current compliance requirements. Hildred Laser, MD Hildred Laser, MD 06/12/2019 9:12:59 AM This report has been signed electronically. Number of Addenda: 0

## 2019-06-12 NOTE — Anesthesia Postprocedure Evaluation (Signed)
Anesthesia Post Note  Patient: Katherine Hayden  Procedure(s) Performed: COLONOSCOPY WITH PROPOFOL (N/A ) POLYPECTOMY BIOPSY  Patient location during evaluation: PACU Anesthesia Type: General Level of consciousness: awake and alert Pain management: pain level controlled Vital Signs Assessment: post-procedure vital signs reviewed and stable Respiratory status: spontaneous breathing, nonlabored ventilation and respiratory function stable Cardiovascular status: stable Postop Assessment: no apparent nausea or vomiting Anesthetic complications: no     Last Vitals:  Vitals:   06/12/19 0732  BP: 134/83  Pulse: 80  Resp: (!) 98  Temp: 36.6 C  SpO2: 98%    Last Pain:  Vitals:   06/12/19 0822  TempSrc:   PainSc: [place]

## 2019-06-12 NOTE — Anesthesia Preprocedure Evaluation (Signed)
Anesthesia Evaluation  Patient identified by MRN, date of birth, ID band Patient awake    Reviewed: Allergy & Precautions, NPO status , Patient's Chart, lab work & pertinent test results  History of Anesthesia Complications Negative for: history of anesthetic complications  Airway Mallampati: II  TM Distance: >3 FB Neck ROM: Full   Comment: ACDF Dental  (+) Missing, Dental Advisory Given   Pulmonary shortness of breath and with exertion, asthma , COPD,  COPD inhaler, Current Smoker,    Pulmonary exam normal breath sounds clear to auscultation       Cardiovascular METShypertension, Pt. on medications Normal cardiovascular exam Rhythm:Regular Rate:Normal     Neuro/Psych  Headaches, PSYCHIATRIC DISORDERS Anxiety Depression    GI/Hepatic GERD  Medicated and Controlled,  Endo/Other  Hypothyroidism   Renal/GU      Musculoskeletal  (+) Arthritis , ACDF   Abdominal   Peds  Hematology   Anesthesia Other Findings   Reproductive/Obstetrics                            Anesthesia Physical Anesthesia Plan  ASA: III  Anesthesia Plan: General   Post-op Pain Management:    Induction: Intravenous  PONV Risk Score and Plan: TIVA  Airway Management Planned: Nasal Cannula and Natural Airway  Additional Equipment:   Intra-op Plan:   Post-operative Plan:   Informed Consent: I have reviewed the patients History and Physical, chart, labs and discussed the procedure including the risks, benefits and alternatives for the proposed anesthesia with the patient or authorized representative who has indicated his/her understanding and acceptance.     Dental advisory given  Plan Discussed with: CRNA  Anesthesia Plan Comments:        Anesthesia Quick Evaluation

## 2019-06-13 ENCOUNTER — Emergency Department (HOSPITAL_COMMUNITY)
Admission: EM | Admit: 2019-06-13 | Discharge: 2019-06-13 | Disposition: A | Discharge status: Home / Self Care | Payer: Medicare Other | Attending: Emergency Medicine | Admitting: Emergency Medicine

## 2019-06-13 ENCOUNTER — Encounter (HOSPITAL_COMMUNITY): Payer: Self-pay | Admitting: Emergency Medicine

## 2019-06-13 ENCOUNTER — Other Ambulatory Visit: Payer: Self-pay

## 2019-06-13 DIAGNOSIS — Y999 Unspecified external cause status: Secondary | ICD-10-CM | POA: Insufficient documentation

## 2019-06-13 DIAGNOSIS — Z9889 Other specified postprocedural states: Secondary | ICD-10-CM | POA: Insufficient documentation

## 2019-06-13 DIAGNOSIS — S0343XA Sprain of jaw, bilateral, initial encounter: Secondary | ICD-10-CM | POA: Diagnosis not present

## 2019-06-13 DIAGNOSIS — S0340XA Sprain of jaw, unspecified side, initial encounter: Secondary | ICD-10-CM

## 2019-06-13 DIAGNOSIS — Y929 Unspecified place or not applicable: Secondary | ICD-10-CM | POA: Diagnosis not present

## 2019-06-13 DIAGNOSIS — R6884 Jaw pain: Secondary | ICD-10-CM | POA: Diagnosis present

## 2019-06-13 DIAGNOSIS — X58XXXA Exposure to other specified factors, initial encounter: Secondary | ICD-10-CM | POA: Insufficient documentation

## 2019-06-13 DIAGNOSIS — Y9389 Activity, other specified: Secondary | ICD-10-CM | POA: Diagnosis not present

## 2019-06-13 NOTE — ED Provider Notes (Signed)
[facility] EMERGENCY DEPARTMENT Provider Note   CSN: [id number] Arrival date & time: 06/13/19  1030     History Chief Complaint  Patient presents with  . Jaw Pain    Katherine Hayden is a 54 y.o. female.  HPI Patient presents with jaw pain.  Pain on both sides.  Worse when she opens her mouth.  Had colonoscopy done yesterday.  Was not having pain at that time.  No difficulty swallowing but does have pain moving the jaw.  No fevers.  Some radiation down into her neck.    Past Medical History:  Diagnosis Date  . Anxiety   . Arthritis   . Asthma   . Back pain   . Cervicogenic headache 08/20/2016  . Chronic headaches   . Chronic pain   . Depression   . Emphysema   . Emphysema of lung ([place])   . Fatty liver   . GERD (gastroesophageal reflux disease)   . High cholesterol   . Hypertension   . Hypothyroidism   . IBS (irritable bowel syndrome)   . Joint pain   . RLS (restless legs syndrome)   . Thyroid disease     Patient Active Problem List   Diagnosis Date Noted  . Vitamin D deficiency 06/09/2019  . Back pain 06/02/2019  . Neck pain 06/02/2019  . Symptomatic mammary hypertrophy 06/02/2019  . Hashimoto's thyroiditis 05/04/2019  . Prediabetes 03/19/2019  . Essential hypertension, benign 03/19/2019  . Mixed hyperlipidemia 03/19/2019  . Cervical disc herniation 12/17/2018  . Cervicogenic headache 08/20/2016  . Dizzy 08/20/2016  . Current smoker 03/10/2010  . DEPRESSION 03/10/2010  . ASTHMA 03/10/2010  . CONJUNCTIVITIS, ACUTE 12/05/2007  . SARCOIDOSIS 11/01/2006  . Hypothyroidism 11/01/2006  . GERD 11/01/2006  . 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 [place] DISEASE, LUMBAR 11/01/2006  . BURSITIS, SHOULDER 11/01/2006    Past Surgical History:  Procedure Laterality Date  . ANTERIOR CERVICAL DECOMP/DISCECTOMY FUSION N/A 12/17/2018   Procedure: ANTERIOR CERVICAL DECOMPRESSION/DISCECTOMY FUSION CERVIAL FIVE THROUGH SIX;  Surgeon: Melina Schools,  MD;  Location: [place];  Service: Orthopedics;  Laterality: N/A;  2.5 hrs  . APPENDECTOMY    . BACK SURGERY     x4  . CHOLECYSTECTOMY  2010  . COLONOSCOPY W/ POLYPECTOMY  07/2013   1 polyp removal  . CYST REMOVAL HAND  2015  . EXCISION MASS UPPER EXTREMETIES Left 02/25/2017   Procedure: EXCISION MASS UPPER EXTREMETIES, LEFT PALM;  Surgeon: Leanora Cover, MD;  Location: [place];  Service: Orthopedics;  Laterality: Left;     OB History    Gravida  0   Para  0   Term  0   Preterm  0   AB  0   Living  0     SAB  0   TAB  0   Ectopic  0   Multiple  0   Live Births  0           Family History  Problem Relation Age of Onset  . Hypertension Mother   . Depression Mother   . Anxiety disorder Mother   . Hypertension Father   . Heart disease Maternal Grandfather   . Heart disease Paternal Grandmother     Social History   Tobacco Use  . Smoking status: Current Every Day Smoker    Packs/day: 0.50    Years: 8.00    Pack years: 4.00    Types: Cigarettes  . Smokeless tobacco: Never  Used  . Tobacco comment: 3 cig daily  Substance Use Topics  . Alcohol use: Yes    Comment: rare  . Drug use: No    Home Medications Prior to Admission medications   Medication Sig Start Date End Date Taking? Authorizing Provider  ALPRAZolam Duanne Moron) 1 MG tablet Take 1 mg by mouth 4 (four) times daily as needed for anxiety.    Yes [provider]  atorvastatin (LIPITOR) 20 MG tablet Take 20 mg by mouth at bedtime.   Yes [provider]  baclofen (LIORESAL) 10 MG tablet Take 10 mg by mouth in the morning and at bedtime. 05/29/19  Yes [provider]  CHANTIX 1 MG tablet Take 1 mg by mouth daily. 05/20/19  Yes [provider]  dicyclomine (BENTYL) 20 MG tablet Take 20 mg by mouth 3 (three) times daily before meals.  08/16/16  Yes [provider]  escitalopram (LEXAPRO) 20 MG tablet Take 20 mg by mouth daily. 05/20/19  Yes [provider]  esomeprazole (NEXIUM) 40 MG capsule Take 40 mg by mouth in the morning and at bedtime.    Yes [provider]  Fluticasone-Salmeterol (ADVAIR) 250-50 MCG/DOSE AEPB Inhale 1 puff into the lungs 2 (two) times daily. 05/06/19  Yes [provider]  HYDROcodone-acetaminophen (NORCO) 10-325 MG tablet Take 1 tablet by mouth every 6 (six) hours as needed (pain.).    Yes [provider]  montelukast (SINGULAIR) 10 MG tablet Take 10 mg by mouth at bedtime.   Yes [provider]  naproxen (NAPROSYN) 500 MG tablet Take 500 mg by mouth 2 (two) times daily. 06/10/19  Yes [provider]  nicotine (NICODERM CQ - DOSED IN MG/24 HOURS) 21 mg/24hr patch Place 14 mg onto the skin daily.  05/02/19  Yes [provider]  ondansetron (ZOFRAN) 4 MG tablet Take 1 tablet (4 mg total) by mouth every 8 (eight) hours as needed for nausea or vomiting. 12/17/18  Yes Melina Schools, MD  PROAIR HFA 108 [phone] BASE) MCG/ACT inhaler Inhale 2 puffs into the lungs every 4 (four) hours as needed for wheezing or shortness of breath.  06/19/13  Yes [provider]  QUEtiapine (SEROQUEL) 300 MG tablet Take 300 mg by mouth at bedtime. 05/06/19  Yes [provider]  RESTASIS 0.05 % ophthalmic emulsion Place 1 drop into both eyes 2 (two) times daily. 04/11/19  Yes [provider]  rOPINIRole (REQUIP) 4 MG tablet Take 4 mg by mouth at bedtime. 05/06/19  Yes [provider]  SYNTHROID 175 MCG tablet Take 1 tablet (175 mcg total) by mouth daily before breakfast. 05/04/19  Yes Nida, Marella Chimes, MD  valACYclovir (VALTREX) 1000 MG tablet Take 1,000 mg by mouth 2 (two) times daily as needed (cold sores.).   Yes [provider]  betamethasone dipropionate 0.05 % cream Apply 1 application topically 2 (two) times daily as needed. 04/20/19   [provider]  methocarbamol (ROBAXIN) 500 MG tablet Take 500 mg by mouth 3 (three) times daily as  needed for spasms. 12/30/18   [provider]    Allergies    Pneumococcal vaccines, Amoxicillin, Codeine, Hydromorphone hcl, Sulfonamide derivatives, and Morphine and related  Review of Systems   Review of Systems  Constitutional: Negative for appetite change and fever.  HENT: Negative for congestion, dental problem, facial swelling and sinus pain.   Respiratory: Negative for shortness of breath.   Gastrointestinal: Positive for abdominal pain.  Mild abdominal pain post colonoscopy.  Neurological: Negative for weakness.    Physical Exam Updated Vital Signs BP 136/89 (BP Location: Right Arm)   Pulse 72   Temp 98.7 F (37.1 C) (Oral)   Resp 15   Ht 5\' 6"  (1.676 m)   Wt 104.3 kg   LMP 12/15/2013 (Approximate)   SpO2 99%   BMI 37.12 kg/m   Physical Exam Vitals reviewed.  HENT:     Mouth/Throat:     Comments: Mild tenderness over bilateral TMJs.  There is a clunking if she opens her mouth much.  Does not appear to dislocate but could sublux.  Posterior pharynx normal. Eyes:     Pupils: Pupils are equal, round, and reactive to light.  Abdominal:     Comments: Mild suprapubic abdominal tenderness.  Musculoskeletal:     Cervical back: Neck supple.  Skin:    General: Skin is warm.  Neurological:     Mental Status: She is alert.     ED Results / Procedures / Treatments   Labs (all labs ordered are listed, but only abnormal results are displayed) Labs Reviewed - No data to display  EKG None  Radiology No results found.  Procedures Procedures (including critical care time)  Medications Ordered in ED Medications - No data to display  ED Course  I have reviewed the triage vital signs and the nursing notes.  Pertinent labs & imaging results that were available during my care of the patient were reviewed by me and considered in my medical decision making (see chart for details).    MDM Rules/Calculators/A&P                      Patient with jaw  pain.  Appears to be bilateral TMJ pain.  May have some subluxing with opening.  Does not appear dislocated however.  Reviewed anesthesiology notes.  Appears that she was not intubated for the procedure and had propofol and ketamine.  Could have been some jaw manipulation however.  We will however basically treat her as if she had dislocated it.  Outpatient follow-up and minimal chewing.  ENT follow-up given since no oral maxillofacial on-call for Final Clinical Impression(s) / ED Diagnoses Final diagnoses:  TMJ (sprain of temporomandibular joint), initial encounter    Rx / DC Orders ED Discharge Orders    None       Davonna Belling, MD 06/13/19 1405

## 2019-06-13 NOTE — Discharge Instructions (Addendum)
I do not think your jaw fully dislocates but you should treat it like it did.  Limit your chewing.  Soft foods.  Follow-up with ear nose and throat or oral maxillofacial surgeon.

## 2019-06-13 NOTE — ED Triage Notes (Signed)
Pain to bilateral jaws since she woke up this morning. Had a colonoscopy yesterday.

## 2019-06-15 LAB — SURGICAL PATHOLOGY

## 2019-06-17 ENCOUNTER — Other Ambulatory Visit: Payer: Self-pay

## 2019-06-17 ENCOUNTER — Ambulatory Visit (HOSPITAL_COMMUNITY)
Admission: RE | Admit: 2019-06-17 | Discharge: 2019-06-17 | Disposition: A | Discharge status: Home / Self Care | Payer: Medicare Other | Source: Ambulatory Visit | Attending: Plastic Surgery | Admitting: Plastic Surgery

## 2019-06-17 DIAGNOSIS — N62 Hypertrophy of breast: Secondary | ICD-10-CM | POA: Insufficient documentation

## 2019-06-17 DIAGNOSIS — G8929 Other chronic pain: Secondary | ICD-10-CM | POA: Diagnosis not present

## 2019-06-17 DIAGNOSIS — M542 Cervicalgia: Secondary | ICD-10-CM | POA: Diagnosis not present

## 2019-06-17 DIAGNOSIS — M546 Pain in thoracic spine: Secondary | ICD-10-CM | POA: Diagnosis not present

## 2019-06-17 DIAGNOSIS — Z1231 Encounter for screening mammogram for malignant neoplasm of breast: Secondary | ICD-10-CM | POA: Insufficient documentation

## 2019-06-22 ENCOUNTER — Other Ambulatory Visit: Payer: Self-pay

## 2019-06-22 ENCOUNTER — Ambulatory Visit (INDEPENDENT_AMBULATORY_CARE_PROVIDER_SITE_OTHER): Payer: Medicare Other | Admitting: Bariatrics

## 2019-06-22 ENCOUNTER — Encounter (INDEPENDENT_AMBULATORY_CARE_PROVIDER_SITE_OTHER): Payer: Self-pay | Admitting: Bariatrics

## 2019-06-22 VITALS — BP 132/83 | HR 80 | Temp 97.7°F | Ht 66.0 in | Wt 224.0 lb

## 2019-06-22 DIAGNOSIS — R7303 Prediabetes: Secondary | ICD-10-CM

## 2019-06-22 DIAGNOSIS — Z6835 Body mass index (BMI) 35.0-35.9, adult: Secondary | ICD-10-CM | POA: Insufficient documentation

## 2019-06-22 DIAGNOSIS — E559 Vitamin D deficiency, unspecified: Secondary | ICD-10-CM | POA: Diagnosis not present

## 2019-06-22 DIAGNOSIS — E786 Lipoprotein deficiency: Secondary | ICD-10-CM

## 2019-06-22 DIAGNOSIS — Z6837 Body mass index (BMI) 37.0-37.9, adult: Secondary | ICD-10-CM | POA: Diagnosis not present

## 2019-06-22 DIAGNOSIS — R7989 Other specified abnormal findings of blood chemistry: Secondary | ICD-10-CM | POA: Diagnosis not present

## 2019-06-22 DIAGNOSIS — I10 Essential (primary) hypertension: Secondary | ICD-10-CM | POA: Diagnosis not present

## 2019-06-22 DIAGNOSIS — Z6836 Body mass index (BMI) 36.0-36.9, adult: Secondary | ICD-10-CM

## 2019-06-22 MED ORDER — VITAMIN D (ERGOCALCIFEROL) 1.25 MG (50000 UNIT) PO CAPS: 50000.0000 [IU] | ORAL_CAPSULE | ORAL | 0 refills | Status: DC

## 2019-06-22 NOTE — Progress Notes (Signed)
Chief Complaint:   OBESITY Katherine Hayden is here to discuss her progress with her obesity treatment plan along with follow-up of her obesity related diagnoses. Katherine Hayden is on the Category 3 Plan and states she is following her eating plan approximately 50% of the time. Katherine Hayden states she is walking 15 minutes 3 times per week.  Today's visit was #: 2 Starting weight: 225 lbs Starting date: 06/08/2019 Today's weight: 224 lbs Today's date: 06/22/2019 Total lbs lost to date: 1 Total lbs lost since last in-office visit: 1  Interim History: Katherine Hayden is down 1 lb. She has followed the plan somewhat. She has been "trying to get more water."  Subjective:   Essential hypertension. Blood pressure is reasonably well controlled. Caril is on no medications.  BP Readings from Last 3 Encounters:  06/22/19 132/83  06/13/19 136/89  06/12/19 126/81   Lab Results  Component Value Date   CREATININE 0.73 06/08/2019   CREATININE 0.8 02/19/2019   CREATININE 0.69 12/16/2018   Vitamin D deficiency. Katherine Hayden is on no medications. Last Vitamin D 11.3 on 06/08/2019.  Prediabetes. Katherine Hayden has a diagnosis of prediabetes based on her elevated HgA1c and was informed this puts her at greater risk of developing diabetes. She continues to work on diet and exercise to decrease her risk of diabetes. She denies nausea or hypoglycemia. Katherine Hayden is on no medications.  Lab Results  Component Value Date   HGBA1C 5.7 (H) 06/08/2019   Lab Results  Component Value Date   INSULIN 14.0 06/08/2019   Low TSH level. Shameya is taking Synthroid 175 mcg daily; only changed from 200 mcg to 175 mcg about 2 weeks ago per the patient.  Low HDL (under 40). HDL low at 37 on 06/08/2019; otherwise, lipid panel normal.  Assessment/Plan:   Essential hypertension. Ande is working on healthy weight loss and exercise to improve blood pressure control. We will watch for signs of hypotension as she continues her lifestyle  modifications. She will increase activity and minimize her salt intake.  Vitamin D deficiency. Low Vitamin D level contributes to fatigue and are associated with obesity, breast, and colon cancer. She was given a prescription for Vitamin D, Ergocalciferol, (DRISDOL) 1.25 MG (50000 UNIT) CAPS capsule every week #4 with 0 refills and will follow-up for routine testing of Vitamin D, at least 2-3 times per year to avoid over-replacement.     Prediabetes. Katherine Hayden will continue to work on weight loss, exercise, increasing healthy fats and protein, and decreasing simple carbohydrates to help decrease the risk of diabetes.   Low TSH level. Katherine Hayden will follow-up with Endocrinology in the near future. If not, will check TSH here.  Low HDL (under 40). Katherine Hayden will increase exercise and increase PUFA's and MUFA's.  Class 2 severe obesity with serious comorbidity and body mass index (BMI) of 37.0 to 37.9 in adult, unspecified obesity type (Katherine Hayden).  Katherine Hayden is currently in the action stage of change. As such, her goal is to continue with weight loss efforts. She has agreed to the Category 3 Plan.   She will work on meal planning, increasing protein and calories, and increasing her water intake.  Exercise goals: Katherine Hayden will continue walking and increase over time.  Behavioral modification strategies: increasing lean protein intake, decreasing simple carbohydrates, increasing vegetables, increasing water intake, ways to avoid boredom eating, ways to avoid night time snacking, better snacking choices, emotional eating strategies and planning for success.  Katherine Hayden has agreed to follow-up with our clinic in  2 weeks. She was informed of the importance of frequent follow-up visits to maximize her success with intensive lifestyle modifications for her multiple health conditions.   Objective:   Blood pressure 132/83, pulse 80, temperature 97.7 F (36.5 C), temperature source Oral, height 5\' 6"  (1.676 m), weight 224 lb  (101.6 kg), last menstrual period 12/15/2013, SpO2 100 %. Body mass index is 36.15 kg/m.  General: Cooperative, alert, well developed, in no acute distress. HEENT: Conjunctivae and lids unremarkable. Cardiovascular: Regular rhythm.  Lungs: Normal work of breathing. Neurologic: No focal deficits.   Lab Results  Component Value Date   CREATININE 0.73 06/08/2019   BUN 12 06/08/2019   NA 143 06/08/2019   K 4.1 06/08/2019   CL 106 06/08/2019   CO2 23 06/08/2019   Lab Results  Component Value Date   ALT 32 06/08/2019   AST 23 06/08/2019   ALKPHOS 185 (H) 06/08/2019   BILITOT 0.3 06/08/2019   Lab Results  Component Value Date   HGBA1C 5.7 (H) 06/08/2019   HGBA1C 5.9 02/19/2019   HGBA1C  12/18/2007    5.5 (NOTE)   The ADA recommends the following therapeutic goal for glycemic   control related to Hgb A1C measurement:   Goal of Therapy:   < 7.0% Hgb A1C   Reference: American Diabetes Association: Clinical Practice   Recommendations 2008, Diabetes Care,  2008, 31:(Suppl 1).   Lab Results  Component Value Date   INSULIN 14.0 06/08/2019   Lab Results  Component Value Date   TSH 0.052 (L) 06/08/2019   Lab Results  Component Value Date   CHOL 134 06/08/2019   HDL 37 (L) 06/08/2019   LDLCALC 72 06/08/2019   TRIG 144 06/08/2019   CHOLHDL 4.3 12/18/2007   Lab Results  Component Value Date   WBC 11.7 02/19/2019   HGB 13.8 02/19/2019   HCT 42 02/19/2019   MCV 97.0 12/16/2018   PLT 266 02/19/2019   No results found for: IRON, TIBC, FERRITIN  Obesity Behavioral Intervention Documentation for Insurance:   Approximately 15 minutes were spent on the discussion below.  ASK: We discussed the diagnosis of obesity with Katherine Hayden today and Katherine Hayden agreed to give [place] permission to discuss obesity behavioral modification therapy today.  ASSESS: Katherine Hayden has the diagnosis of obesity and her BMI today is 37.3. Khaliah is in the action stage of change.   ADVISE: Katherine Hayden was educated on  the multiple health risks of obesity as well as the benefit of weight loss to improve her health. She was advised of the need for long term treatment and the importance of lifestyle modifications to improve her current health and to decrease her risk of future health problems.  AGREE: Multiple dietary modification options and treatment options were discussed and Katherine Hayden agreed to follow the recommendations documented in the above note.  ARRANGE: Katherine Hayden was educated on the importance of frequent visits to treat obesity as outlined per CMS and USPSTF guidelines and agreed to schedule her next follow up appointment today.  Attestation Statements:   Reviewed by clinician on day of visit: allergies, medications, problem list, medical history, surgical history, family history, social history, and previous encounter notes.  Migdalia Dk, am acting as Location manager for [employer], DO   I have reviewed the above documentation for accuracy and completeness, and I agree with the above. Jearld Lesch, DO

## 2019-06-25 ENCOUNTER — Other Ambulatory Visit (HOSPITAL_COMMUNITY): Payer: Self-pay | Admitting: Family Medicine

## 2019-06-25 DIAGNOSIS — Z1231 Encounter for screening mammogram for malignant neoplasm of breast: Secondary | ICD-10-CM

## 2019-07-06 ENCOUNTER — Encounter: Payer: Self-pay | Admitting: "Endocrinology

## 2019-07-06 ENCOUNTER — Encounter (INDEPENDENT_AMBULATORY_CARE_PROVIDER_SITE_OTHER): Payer: Self-pay | Admitting: Family Medicine

## 2019-07-06 ENCOUNTER — Ambulatory Visit (INDEPENDENT_AMBULATORY_CARE_PROVIDER_SITE_OTHER): Payer: Medicare Other | Admitting: Family Medicine

## 2019-07-06 ENCOUNTER — Other Ambulatory Visit: Payer: Self-pay

## 2019-07-06 ENCOUNTER — Ambulatory Visit (INDEPENDENT_AMBULATORY_CARE_PROVIDER_SITE_OTHER): Payer: Medicare Other | Admitting: "Endocrinology

## 2019-07-06 VITALS — BP 133/84 | HR 77 | Temp 98.3°F | Ht 66.0 in | Wt 221.0 lb

## 2019-07-06 DIAGNOSIS — Z6835 Body mass index (BMI) 35.0-35.9, adult: Secondary | ICD-10-CM

## 2019-07-06 DIAGNOSIS — E039 Hypothyroidism, unspecified: Secondary | ICD-10-CM | POA: Diagnosis not present

## 2019-07-06 DIAGNOSIS — E063 Autoimmune thyroiditis: Secondary | ICD-10-CM

## 2019-07-06 DIAGNOSIS — R7303 Prediabetes: Secondary | ICD-10-CM

## 2019-07-06 MED ORDER — SYNTHROID 150 MCG PO TABS: 150.0000 ug | ORAL_TABLET | Freq: Every day | ORAL | 1 refills | Status: DC

## 2019-07-06 NOTE — Progress Notes (Signed)
07/06/2019, 7:47 PM                                           Endocrinology Telehealth Visit Follow up Note -During COVID -19 Pandemic  I connected with Katherine Hayden on 07/06/2019   by telephone and verified that I am speaking with the correct person using two identifiers. Katherine Hayden, July 08, 1965. she has verbally consented to this visit. All issues noted in this document were discussed and addressed. The format was not optimal for physical exam.    Subjective:    [name], female    DOB: 1965/12/20, PCP Katherine Ro, DO   Past Medical History:  Diagnosis Date  . Anxiety   . Arthritis   . Asthma   . Back pain   . Cervicogenic headache 08/20/2016  . Chronic headaches   . Chronic pain   . Depression   . Emphysema   . Emphysema of lung ([place])   . Fatty liver   . GERD (gastroesophageal reflux disease)   . High cholesterol   . Hypertension   . Hypothyroidism   . IBS (irritable bowel syndrome)   . Joint pain   . RLS (restless legs syndrome)   . Thyroid disease    Past Surgical History:  Procedure Laterality Date  . ANTERIOR CERVICAL DECOMP/DISCECTOMY FUSION N/A 12/17/2018   Procedure: ANTERIOR CERVICAL DECOMPRESSION/DISCECTOMY FUSION CERVIAL FIVE THROUGH SIX;  Surgeon: Melina Schools, MD;  Location: [facility];  Service: Orthopedics;  Laterality: N/A;  2.5 hrs  . APPENDECTOMY    . BACK SURGERY     x4  . BIOPSY  06/12/2019   Procedure: BIOPSY;  Surgeon: Rogene Houston, MD;  [facility];  Service: Endoscopy;;  random, sigmoid  . CHOLECYSTECTOMY  2010  . COLONOSCOPY W/ POLYPECTOMY  07/2013   1 polyp removal  . COLONOSCOPY WITH PROPOFOL N/A 06/12/2019   Procedure: COLONOSCOPY WITH PROPOFOL;  Surgeon: Rogene Houston, MD;  [facility];  Service: Endoscopy;  Laterality: N/A;  825  . CYST REMOVAL HAND  2015  . EXCISION MASS UPPER EXTREMETIES Left 02/25/2017    Procedure: EXCISION MASS UPPER EXTREMETIES, LEFT PALM;  Surgeon: Leanora Cover, MD;  Location: [facility];  Service: Orthopedics;  Laterality: Left;  . POLYPECTOMY  06/12/2019   Procedure: POLYPECTOMY;  Surgeon: Rogene Houston, MD;  [facility];  Service: Endoscopy;;  ascending;hepatic flexurex2;splenic flexure   Social History   Socioeconomic History  . Marital status: Divorced    Spouse name: Not on file  . Number of children: Not on file  . Years of education: Not on file  . Highest education level: Not on file  Occupational History  . Occupation: Disabled  Tobacco Use  . Smoking status: Current Every Day Smoker    Packs/day: 0.50    Years: 8.00    Pack years: 4.00    Types: Cigarettes  . Smokeless tobacco: Never Used  . Tobacco comment: 3 cig daily  Substance and Sexual Activity  . Alcohol use: Yes    Comment:  rare  . Drug use: No  . Sexual activity: Not on file  Other Topics Concern  . Not on file  Social History Narrative  . Not on file   Social Determinants of Health   Financial Resource Strain:   . Difficulty of Paying Living Expenses:   Food Insecurity:   . Worried About Charity fundraiser in the Last Year:   . Arboriculturist in the Last Year:   Transportation Needs:   . Film/video editor (Medical):   Marland Kitchen Lack of Transportation (Non-Medical):   Physical Activity:   . Days of Exercise per Week:   . Minutes of Exercise per Session:   Stress:   . Feeling of Stress :   Social Connections:   . Frequency of Communication with Friends and Family:   . Frequency of Social Gatherings with Friends and Family:   . Attends Religious Services:   . Active Member of Clubs or Organizations:   . Attends Archivist Meetings:   Marland Kitchen Marital Status:    Family History  Problem Relation Age of Onset  . Hypertension Mother   . Depression Mother   . Anxiety disorder Mother   . Hypertension Father   . Heart disease Maternal Grandfather    . Heart disease Paternal Grandmother    Outpatient Encounter Medications as of 07/06/2019  Medication Sig  . ALPRAZolam (XANAX) 1 MG tablet Take 1 mg by mouth 4 (four) times daily as needed for anxiety.   Marland Kitchen atorvastatin (LIPITOR) 20 MG tablet Take 20 mg by mouth at bedtime.  . baclofen (LIORESAL) 10 MG tablet Take 10 mg by mouth in the morning and at bedtime.  . betamethasone dipropionate 0.05 % cream Apply 1 application topically 2 (two) times daily as needed.  . CHANTIX 1 MG tablet Take 1 mg by mouth daily.  Marland Kitchen dicyclomine (BENTYL) 20 MG tablet Take 20 mg by mouth 3 (three) times daily before meals.   Marland Kitchen escitalopram (LEXAPRO) 20 MG tablet Take 20 mg by mouth daily.  Marland Kitchen esomeprazole (NEXIUM) 40 MG capsule Take 40 mg by mouth in the morning and at bedtime.   . Fluticasone-Salmeterol (ADVAIR) 250-50 MCG/DOSE AEPB Inhale 1 puff into the lungs 2 (two) times daily.  Marland Kitchen HYDROcodone-acetaminophen (NORCO) 10-325 MG tablet Take 1 tablet by mouth every 6 (six) hours as needed (pain.).   Marland Kitchen methocarbamol (ROBAXIN) 500 MG tablet Take 500 mg by mouth 3 (three) times daily as needed for spasms.  . montelukast (SINGULAIR) 10 MG tablet Take 10 mg by mouth at bedtime.  . naproxen (NAPROSYN) 500 MG tablet Take 500 mg by mouth 2 (two) times daily.  . nicotine (NICODERM CQ - DOSED IN MG/24 HOURS) 21 mg/24hr patch Place 14 mg onto the skin daily.   . ondansetron (ZOFRAN) 4 MG tablet Take 1 tablet (4 mg total) by mouth every 8 (eight) hours as needed for nausea or vomiting.  Marland Kitchen PROAIR HFA 108 (90 BASE) MCG/ACT inhaler Inhale 2 puffs into the lungs every 4 (four) hours as needed for wheezing or shortness of breath.   . QUEtiapine (SEROQUEL) 300 MG tablet Take 300 mg by mouth at bedtime.  . RESTASIS 0.05 % ophthalmic emulsion Place 1 drop into both eyes 2 (two) times daily.  Marland Kitchen rOPINIRole (REQUIP) 4 MG tablet Take 4 mg by mouth at bedtime.  Marland Kitchen SYNTHROID 150 MCG tablet Take 1 tablet (150 mcg total) by mouth daily before  breakfast.  . valACYclovir (VALTREX) 1000 MG  tablet Take 1,000 mg by mouth 2 (two) times daily as needed (cold sores.).  Marland Kitchen Vitamin D, Ergocalciferol, (DRISDOL) 1.25 MG (50000 UNIT) CAPS capsule Take 1 capsule (50,000 Units total) by mouth every 7 (seven) days.  . [DISCONTINUED] SYNTHROID 175 MCG tablet Take 1 tablet (175 mcg total) by mouth daily before breakfast.   No facility-administered encounter medications on file as of 07/06/2019.   ALLERGIES: Allergies  Allergen Reactions  . Pneumococcal Vaccines Itching and Other (See Comments)    Patient received Influenza and Pneumococcal vaccines at the same time-caused severe itching and needs medication to reverse reaction, this caused her to go into cardiac arrest at that time.    Marland Kitchen Amoxicillin Hives    Did it involve swelling of the face/tongue/throat, SOB, or low BP? Yes Did it involve sudden or severe rash/hives, skin peeling, or any reaction on the inside of your mouth or nose? Yes Did you need to seek medical attention at a hospital or doctor's office? Unknown When did it last happen? 6-7 years ago If all above answers are "NO", may proceed with cephalosporin use.   . Codeine Nausea And Vomiting  . Hydromorphone Hcl Nausea And Vomiting  . Sulfonamide Derivatives Hives  . Morphine And Related Hives    VACCINATION STATUS: Immunization History  Administered Date(s) Administered  . Influenza Whole 01/26/2008    HPI Myca Staloch is 54 y.o. female who is being engaged in telehealth via telephone in follow-up for hypothyroidism.    PMD: Katherine Ro, DO.    She was diagnosed with hypothyroidism approximately at age of 31 years.  She has taken various dose of levothyroxine over the years.. -During her last visit, she was initiated on Synthroid 175 mcg daily before breakfast.    She is compliant.  Her previsit labs are consistent with over replacement.   -She has no new complaints today.  See note from last  visit.  She has family history of hypothyroidism in  her brother. -Her medical history also includes hyperlipidemia and hypertension on treatment.   Review of Systems  Limited as above.  Objective:    LMP 12/15/2013 (Approximate)   Wt Readings from Last 3 Encounters:  07/06/19 221 lb (100.2 kg)  06/22/19 224 lb (101.6 kg)  06/13/19 230 lb (104.3 kg)    Physical Exam    CMP ( most recent) CMP     Component Value Date/Time   NA 143 06/08/2019 1223   K 4.1 06/08/2019 1223   CL 106 06/08/2019 1223   CO2 23 06/08/2019 1223   GLUCOSE 94 06/08/2019 1223   GLUCOSE 89 12/16/2018 1025   BUN 12 06/08/2019 1223   CREATININE 0.73 06/08/2019 1223   CALCIUM 9.0 06/08/2019 1223   PROT 6.4 06/08/2019 1223   ALBUMIN 4.0 06/08/2019 1223   AST 23 06/08/2019 1223   ALT 32 06/08/2019 1223   ALKPHOS 185 (H) 06/08/2019 1223   BILITOT 0.3 06/08/2019 1223   GFRNONAA 92 06/08/2019 1223   GFRAA 106 06/08/2019 1223     Diabetic Labs (most recent): Lab Results  Component Value Date   HGBA1C 5.7 (H) 06/08/2019   HGBA1C 5.9 02/19/2019   HGBA1C  12/18/2007    5.5 (NOTE)   The ADA recommends the following therapeutic goal for glycemic   control related to Hgb A1C measurement:   Goal of Therapy:   < 7.0% Hgb A1C   Reference: American Diabetes Association: Clinical Practice   Recommendations 2008, Diabetes Care,  2008, 31:(Suppl  1).     Lipid Panel ( most recent) Lipid Panel     Component Value Date/Time   CHOL 134 06/08/2019 1223   TRIG 144 06/08/2019 1223   HDL 37 (L) 06/08/2019 1223   CHOLHDL 4.3 12/18/2007 0605   VLDL 19 12/18/2007 0605   LDLCALC 72 06/08/2019 1223   LABVLDL 25 06/08/2019 1223     Results for KEELEIGH, GILLSON (MRN [id number]) as of 05/04/2019 17:02  Ref. Range 04/22/2019 00:00  TSH Latest Ref Range: 0.41 - 5.90  0.02 (A)   On March 26, 2019: Her baseline thyroid ultrasound is unremarkable with no discrete nodules, overall size of her thyroid is smaller  than average-right lobe 4.2 cm, left lobe 3.5 cm.  Results for DAWNIELLE, TACKMAN (MRN [id number]) as of 07/06/2019 19:49  Ref. Range 06/08/2019 12:23  TSH Latest Ref Range: 0.450 - 4.500 uIU/mL 0.052 (L)  Triiodothyronine (T3) Latest Ref Range: 71 - 180 ng/dL 98  T4,Free(Direct) Latest Ref Range: 0.82 - 1.77 ng/dL 1.72    Assessment & Plan:   1. Acquired hypothyroidism 2.  Hashimoto's thyroiditis  -Her previsit work-up confirms etiology for hypothyroidism is Hashimoto's thyroiditis.  There is a good evidence that she will benefit from Synthroid treatment as opposed to levothyroxine.  Her previsit thyroid function tests are still concerned with slight over replacement.  I discussed and lowered her Synthroid to 150 mcg p.o. daily before breakfast.    - We discussed about the correct intake of her thyroid hormone, on empty stomach at fasting, with water, separated by at least 30 minutes from breakfast and other medications,  and separated by more than 4 hours from calcium, iron, multivitamins, acid reflux medications (PPIs). -Patient is made aware of the fact that thyroid hormone replacement is needed for life, dose to be adjusted by periodic monitoring of thyroid function tests.   -Her recent thyroid ultrasound did not document thyromegaly nor nodules. -She has A1c of 5.9% consistent with prediabetes.   - she is advised to maintain close follow up with Katherine Ro, DO for primary care needs.     - Time spent on this patient care encounter:  25 minutes of which 50% was spent in  counseling and the rest reviewing  her current and  previous labs / studies and medications  doses and developing a plan for long term care. Katherine Hayden  participated in the discussions, expressed understanding, and voiced agreement with the above plans.  All questions were answered to her satisfaction. she is encouraged to contact clinic should she have any questions or concerns prior to her return  visit.  Follow up plan: Return in about 6 months (around 01/06/2020) for Follow up with Pre-visit Labs.   Glade Lloyd, MD [facility] [address] [place], [place] [address] Phone: [phone]  Fax: [phone]     07/06/2019, 7:47 PM  This note was partially dictated with voice recognition software. Similar sounding words can be transcribed inadequately or may not  be corrected upon review.

## 2019-07-07 ENCOUNTER — Encounter (INDEPENDENT_AMBULATORY_CARE_PROVIDER_SITE_OTHER): Payer: Self-pay | Admitting: Family Medicine

## 2019-07-07 DIAGNOSIS — M545 Low back pain: Secondary | ICD-10-CM | POA: Diagnosis not present

## 2019-07-07 DIAGNOSIS — M542 Cervicalgia: Secondary | ICD-10-CM | POA: Diagnosis not present

## 2019-07-07 NOTE — Progress Notes (Signed)
Chief Complaint:   OBESITY Katherine Hayden is here to discuss her progress with her obesity treatment plan along with follow-up of her obesity related diagnoses. Katherine Hayden is on the Category 3 Plan and states she is following her eating plan approximately 70% of the time. Katherine Hayden states she is walking for 20-30 minutes 3 times per week.  Today's visit was #: 3 Starting weight: 225 lbs Starting date: 06/08/2019 Today's weight: 221 lbs Today's date: 07/06/2019 Total lbs lost to date: 4 Total lbs lost since last in-office visit: 3  Interim History: Katherine Hayden is deviating from the plan significantly. She has a protein bar for breakfast and skips lunch. She eats out 2-3 times a week for dinner. She is wondering if she is a candidate for bariatric surgery.   Subjective:   1. Pre-diabetes Katherine Hayden denies polyphagia. She skips 1-2 meals a day. Lab Results  Component Value Date   HGBA1C 5.7 (H) 06/08/2019    Assessment/Plan:   1. Pre-diabetes Katherine Hayden will continue to work on weight loss, exercise, and increase protein, and avoid simple carbohydrates to help decrease the risk of diabetes.   2. Class 2 severe obesity with serious comorbidity and body mass index (BMI) of 35.0 to 35.9 in adult, unspecified obesity type [facility]) Katherine Hayden is currently in the action stage of change. As such, her goal is to continue with weight loss efforts. She has agreed to the Category 3 Plan.   Information was provided to sign up for bariatric surgery seminar [facility]). Katherine Hayden was advised she will not be a candidate for surgery if her BMI is over 35. She was also advised that bariatric surgery will not be successful long-term without significant dietery changas.   Exercise goals: As is.  Behavioral modification strategies: no skipping meals and meal planning and cooking strategies.  Katherine Hayden has agreed to follow-up with our clinic as needed. She was informed of the importance of frequent follow-up visits to maximize her  success with intensive lifestyle modifications for her multiple health conditions.   Objective:   Blood pressure 133/84, pulse 77, temperature 98.3 F (36.8 C), height 5\' 6"  (1.676 m), weight 221 lb (100.2 kg), last menstrual period 12/15/2013, SpO2 99 %. Body mass index is 35.67 kg/m.  General: Cooperative, alert, well developed, in no acute distress. HEENT: Conjunctivae and lids unremarkable. Cardiovascular: Regular rhythm.  Lungs: Normal work of breathing. Neurologic: No focal deficits.   Lab Results  Component Value Date   CREATININE 0.73 06/08/2019   BUN 12 06/08/2019   NA 143 06/08/2019   K 4.1 06/08/2019   CL 106 06/08/2019   CO2 23 06/08/2019   Lab Results  Component Value Date   ALT 32 06/08/2019   AST 23 06/08/2019   ALKPHOS 185 (H) 06/08/2019   BILITOT 0.3 06/08/2019   Lab Results  Component Value Date   HGBA1C 5.7 (H) 06/08/2019   HGBA1C 5.9 02/19/2019   HGBA1C  12/18/2007    5.5 (NOTE)   The ADA recommends the following therapeutic goal for glycemic   control related to Hgb A1C measurement:   Goal of Therapy:   < 7.0% Hgb A1C   Reference: American Diabetes Association: Clinical Practice   Recommendations 2008, Diabetes Care,  2008, 31:(Suppl 1).   Lab Results  Component Value Date   INSULIN 14.0 06/08/2019   Lab Results  Component Value Date   TSH 0.052 (L) 06/08/2019   Lab Results  Component Value Date   CHOL 134 06/08/2019   HDL  37 (L) 06/08/2019   LDLCALC 72 06/08/2019   TRIG 144 06/08/2019   CHOLHDL 4.3 12/18/2007   Lab Results  Component Value Date   WBC 11.7 02/19/2019   HGB 13.8 02/19/2019   HCT 42 02/19/2019   MCV 97.0 12/16/2018   PLT 266 02/19/2019   No results found for: IRON, TIBC, FERRITIN  Attestation Statements:   Reviewed by clinician on day of visit: allergies, medications, problem list, medical history, surgical history, family history, social history, and previous encounter notes.   Wilhemena Durie, am acting as  Location manager for [employer], FNP-C.  I have reviewed the above documentation for accuracy and completeness, and I agree with the above. -  Georgianne Fick, FNP

## 2019-07-08 ENCOUNTER — Other Ambulatory Visit (INDEPENDENT_AMBULATORY_CARE_PROVIDER_SITE_OTHER): Payer: Self-pay | Admitting: Bariatrics

## 2019-07-08 DIAGNOSIS — E559 Vitamin D deficiency, unspecified: Secondary | ICD-10-CM

## 2019-07-14 ENCOUNTER — Ambulatory Visit (INDEPENDENT_AMBULATORY_CARE_PROVIDER_SITE_OTHER): Payer: Medicare Other | Admitting: Surgical

## 2019-07-14 ENCOUNTER — Other Ambulatory Visit: Payer: Self-pay

## 2019-07-14 ENCOUNTER — Encounter: Payer: Self-pay | Admitting: Surgical

## 2019-07-14 VITALS — BP 122/79 | HR 77 | Temp 98.0°F | Ht 66.0 in | Wt 224.6 lb

## 2019-07-14 DIAGNOSIS — N62 Hypertrophy of breast: Secondary | ICD-10-CM

## 2019-07-14 MED ORDER — HYDROCODONE-ACETAMINOPHEN 5-325 MG PO TABS: 1.0000 | ORAL_TABLET | Freq: Four times a day (QID) | ORAL | 0 refills | Status: AC | PRN

## 2019-07-14 MED ORDER — ONDANSETRON HCL 4 MG PO TABS: 4.0000 mg | ORAL_TABLET | Freq: Three times a day (TID) | ORAL | 0 refills | Status: AC | PRN

## 2019-07-14 MED ORDER — CIPROFLOXACIN HCL 500 MG PO TABS: 500.0000 mg | ORAL_TABLET | Freq: Two times a day (BID) | ORAL | 0 refills | Status: DC

## 2019-07-14 NOTE — Progress Notes (Signed)
[name], female    DOB: 22-Dec-1965, 54 y.o.   MRN: [id number]  Chief Complaint  Patient presents with   Pre-op Exam    H&P SX 08/06/19 BL breast reduction      ICD-10-CM   1. Symptomatic mammary hypertrophy  N62 Nicotine/cotinine metabolites    History of Present Illness: Katherine Hayden is a 54 y.o.  female  with a history of mammary hyperplasia.  She presents for preoperative evaluation for upcoming procedure, bilateral breast reduction, scheduled for 08/06/19 with Dr. Marla Roe.  Patient is here today with her mother she would like to be a C cup, smaller than expected upon waking up.  The patient has not had problems with anesthesia.  She has a surgical history of multiple spinal surgeries, appendectomy, cholecystectomy.  She tolerated these procedures well.  She does not have any history of DVT/PE.  No family history of DVT/PE.  She is not taking any blood thinners.  No personal or family history of any bleeding or clotting disorders.   She has a past medical history of anxiety, hyperlipidemia, hypertension, GERD, emphysema, chronic pain, hypothyroidism, arthritis.  She does not have any acute symptoms at this time.  She has not had any recent colds or fevers.  Blood pressure well controlled.  No new complaints.   Patient's STN on the right is 33 cm, STN on the left is 33 cm.  IMF is 13 cm.  Patient is 5 feet 6 inches tall and 224 pounds.  Her preoperative bra size is 44D cup.  Estimated excess breast tissue removed at time of surgery is 500 g on each side.  Patient would like to be a C cup, she reports that she would like to be smaller than expected upon waking up rather than larger than expected.   Past Medical History: Allergies: Allergies  Allergen Reactions   Pneumococcal Vaccines Itching and Other (See Comments)    Patient received Influenza and Pneumococcal vaccines at the same time-caused severe itching and needs medication to reverse  reaction, this caused her to go into cardiac arrest at that time.     Amoxicillin Hives    Did it involve swelling of the face/tongue/throat, SOB, or low BP? Yes Did it involve sudden or severe rash/hives, skin peeling, or any reaction on the inside of your mouth or nose? Yes Did you need to seek medical attention at a hospital or doctor's office? Unknown When did it last happen? 6-7 years ago If all above answers are NO, may proceed with cephalosporin use.    Codeine Nausea And Vomiting   Hydromorphone Hcl Nausea And Vomiting   Sulfonamide Derivatives Hives   Morphine And Related Hives    Current Medications:  Current Outpatient Medications:    ALPRAZolam (XANAX) 1 MG tablet, Take 1 mg by mouth 4 (four) times daily as needed for anxiety. , Disp: , Rfl:    atorvastatin (LIPITOR) 20 MG tablet, Take 20 mg by mouth at bedtime., Disp: , Rfl:    baclofen (LIORESAL) 10 MG tablet, Take 10 mg by mouth in the morning and at bedtime., Disp: , Rfl:    betamethasone dipropionate 0.05 % cream, Apply 1 application topically 2 (two) times daily as needed., Disp: , Rfl:    CHANTIX 1 MG tablet, Take 1 mg by mouth daily., Disp: , Rfl:    dicyclomine (BENTYL) 20 MG tablet, Take 20 mg by mouth 3 (three) times daily before meals. , Disp: , Rfl:  2   escitalopram (LEXAPRO) 20 MG tablet, Take 20 mg by mouth daily., Disp: , Rfl:    esomeprazole (NEXIUM) 40 MG capsule, Take 40 mg by mouth in the morning and at bedtime. , Disp: , Rfl:    Fluticasone-Salmeterol (ADVAIR) 250-50 MCG/DOSE AEPB, Inhale 1 puff into the lungs 2 (two) times daily., Disp: , Rfl:    HYDROcodone-acetaminophen (NORCO) 10-325 MG tablet, Take 1 tablet by mouth every 6 (six) hours as needed (pain.). , Disp: , Rfl:    methocarbamol (ROBAXIN) 500 MG tablet, Take 500 mg by mouth 3 (three) times daily as needed for spasms., Disp: , Rfl:    montelukast (SINGULAIR) 10 MG tablet, Take 10 mg by mouth at bedtime., Disp: , Rfl:     naproxen (NAPROSYN) 500 MG tablet, Take 500 mg by mouth 2 (two) times daily., Disp: , Rfl:    nicotine (NICODERM CQ - DOSED IN MG/24 HOURS) 21 mg/24hr patch, Place 14 mg onto the skin daily. , Disp: , Rfl:    PROAIR HFA 108 (90 BASE) MCG/ACT inhaler, Inhale 2 puffs into the lungs every 4 (four) hours as needed for wheezing or shortness of breath. , Disp: , Rfl:    QUEtiapine (SEROQUEL) 300 MG tablet, Take 300 mg by mouth at bedtime., Disp: , Rfl:    RESTASIS 0.05 % ophthalmic emulsion, Place 1 drop into both eyes 2 (two) times daily., Disp: , Rfl:    rOPINIRole (REQUIP) 4 MG tablet, Take 4 mg by mouth at bedtime., Disp: , Rfl:    SYNTHROID 150 MCG tablet, Take 1 tablet (150 mcg total) by mouth daily before breakfast., Disp: 90 tablet, Rfl: 1   valACYclovir (VALTREX) 1000 MG tablet, Take 1,000 mg by mouth 2 (two) times daily as needed (cold sores.)., Disp: , Rfl:    VITAMIN D PO, Take by mouth., Disp: , Rfl:    Vitamin D, Ergocalciferol, (DRISDOL) 1.25 MG (50000 UNIT) CAPS capsule, Take 1 capsule (50,000 Units total) by mouth every 7 (seven) days., Disp: 4 capsule, Rfl: 0   ciprofloxacin (CIPRO) 500 MG tablet, Take 1 tablet (500 mg total) by mouth 2 (two) times daily., Disp: 6 tablet, Rfl: 0   HYDROcodone-acetaminophen (NORCO) 5-325 MG tablet, Take 1 tablet by mouth every 6 (six) hours as needed for up to 5 days for severe pain., Disp: 20 tablet, Rfl: 0   ondansetron (ZOFRAN) 4 MG tablet, Take 1 tablet (4 mg total) by mouth every 8 (eight) hours as needed for nausea or vomiting. (Patient not taking: Reported on 07/14/2019), Disp: 20 tablet, Rfl: 0   ondansetron (ZOFRAN) 4 MG tablet, Take 1 tablet (4 mg total) by mouth every 8 (eight) hours as needed for nausea or vomiting., Disp: 20 tablet, Rfl: 0  Past Medical Problems: Past Medical History:  Diagnosis Date   Anxiety    Arthritis    Asthma    Back pain    Cervicogenic headache 08/20/2016   Chronic headaches    Chronic pain      Depression    Emphysema    Emphysema of lung (HCC)    Fatty liver    GERD (gastroesophageal reflux disease)    High cholesterol    Hypertension    Hypothyroidism    IBS (irritable bowel syndrome)    Joint pain    RLS (restless legs syndrome)    Thyroid disease     Past Surgical History: Past Surgical History:  Procedure Laterality Date   ANTERIOR CERVICAL DECOMP/DISCECTOMY FUSION N/A 12/17/2018  Procedure: ANTERIOR CERVICAL DECOMPRESSION/DISCECTOMY FUSION CERVIAL FIVE THROUGH SIX;  Surgeon: Melina Schools, MD;  Location: [facility];  Service: Orthopedics;  Laterality: N/A;  2.5 hrs   APPENDECTOMY     BACK SURGERY     x4   BIOPSY  06/12/2019   Procedure: BIOPSY;  Surgeon: Rogene Houston, MD;  [facility];  Service: Endoscopy;;  random, sigmoid   CHOLECYSTECTOMY  2010   COLONOSCOPY W/ POLYPECTOMY  07/2013   1 polyp removal   COLONOSCOPY WITH PROPOFOL N/A 06/12/2019   Procedure: COLONOSCOPY WITH PROPOFOL;  Surgeon: Rogene Houston, MD;  [facility]: [facility];  Service: Endoscopy;  Laterality: N/A;  825   CYST REMOVAL HAND  2015   EXCISION MASS UPPER EXTREMETIES Left 02/25/2017   Procedure: EXCISION MASS UPPER EXTREMETIES, LEFT PALM;  Surgeon: Leanora Cover, MD;  Location: [facility];  Service: Orthopedics;  Laterality: Left;   POLYPECTOMY  06/12/2019   Procedure: POLYPECTOMY;  Surgeon: Rogene Houston, MD;  [facility]: [facility];  Service: Endoscopy;;  ascending;hepatic flexurex2;splenic flexure    Social History: Social History   Socioeconomic History   Marital status: Divorced    Spouse name: Not on file   Number of children: Not on file   Years of education: Not on file   Highest education level: Not on file  Occupational History   Occupation: Disabled  Tobacco Use   Smoking status: Current Every Day Smoker    Packs/day: 0.50    Years: 8.00    Pack years: 4.00    Types: Cigarettes   Smokeless tobacco: Never Used    Tobacco comment: 3 cig daily  Substance and Sexual Activity   Alcohol use: Yes    Comment: rare   Drug use: No   Sexual activity: Not on file  Other Topics Concern   Not on file  Social History Narrative   Not on file   Social Determinants of Health   Financial Resource Strain:    Difficulty of Paying Living Expenses:   Food Insecurity:    Worried About Charity fundraiser in the Last Year:    Arboriculturist in the Last Year:   Transportation Needs:    Film/video editor (Medical):    Lack of Transportation (Non-Medical):   Physical Activity:    Days of Exercise per Week:    Minutes of Exercise per Session:   Stress:    Feeling of Stress :   Social Connections:    Frequency of Communication with Friends and Family:    Frequency of Social Gatherings with Friends and Family:    Attends Religious Services:    Active Member of Clubs or Organizations:    Attends Music therapist:    Marital Status:   Intimate Partner Violence:    Fear of Current or Ex-Partner:    Emotionally Abused:    Physically Abused:    Sexually Abused:     Family History: Family History  Problem Relation Age of Onset   Hypertension Mother    Depression Mother    Anxiety disorder Mother    Hypertension Father    Heart disease Maternal Grandfather    Heart disease Paternal Grandmother     Review of Systems: Review of Systems  Constitutional: Negative.   Respiratory: Negative.   Cardiovascular: Negative.   Gastrointestinal: Negative.   Genitourinary: Negative.   Skin: Negative.   Neurological: Negative.     Physical Exam: Vital Signs BP  122/79 (BP Location: Left Arm, Patient Position: Sitting, Cuff Size: Large)    Pulse 77    Temp 98 F (36.7 C) (Temporal)    Ht 5\' 6"  (1.676 m)    Wt 224 lb 9.6 oz (101.9 kg)    LMP 12/15/2013 (Approximate)    SpO2 97%    BMI 36.25 kg/m  Physical Exam Exam conducted with a chaperone present.   Constitutional:      General: She is not in acute distress.    Appearance: Normal appearance. She is not ill-appearing.  HENT:     Head: Normocephalic and atraumatic.  Eyes:     Pupils: Pupils are equal, round Neck:     Musculoskeletal: Normal range of motion.  Cardiovascular:     Rate and Rhythm: Normal rate and regular rhythm.     Pulses: Normal pulses.     Heart sounds: Normal heart sounds. No murmur.  Pulmonary:     Effort: Pulmonary effort is normal. No respiratory distress.     Breath sounds: Normal breath sounds. No wheezing.  Abdominal:     General: Abdomen is flat. There is no distension.     Palpations: Abdomen is soft.     Tenderness: There is no abdominal tenderness.  Musculoskeletal: Normal range of motion.  Skin:    General: Skin is warm and dry.     Findings: No erythema or rash.  Neurological:     General: No focal deficit present.     Mental Status: She is alert and oriented to person, place, and time. Mental status is at baseline.     Motor: No weakness.  Psychiatric:        Mood and Affect: Mood normal.        Behavior: Behavior normal.    Assessment/Plan: The patient is scheduled for bilateral breast reduction with Dr. Marla Roe on 08/06/2019.  Risks, benefits, and alternatives of procedure discussed, questions answered and consent obtained.    Patient provided with general consent form covering surgical risks associated with bilateral breast reduction and general surgical intervention. Patient was allocated adequate time prior to appointment to read through, initial and sign that they agree that they are aware of the risks associated. We also covered the risks in person during this pre-op appointment. I answered all of the patient's questions to their content and informed them to call with any further questions or concerns prior to surgery and I would be happy to discuss with them further.  The risk that can be encountered with breast reduction were  discussed and include the following but not limited to these:  Breast asymmetry, fluid accumulation, firmness of the breast, inability to breast feed, loss of nipple or areola, skin loss, decrease or no nipple sensation, fat necrosis of the breast tissue, bleeding, infection, healing delay.  There are risks of anesthesia, changes to skin sensation and injury to nerves or blood vessels.  The muscle can be temporarily or permanently injured.  You may have an allergic reaction to tape, suture, glue, blood products which can result in skin discoloration, swelling, pain, skin lesions, poor healing.  Any of these can lead to the need for revisonal surgery or stage procedures.  A reduction has potential to interfere with diagnostic procedures.  Nipple or breast piercing can increase risks of infection.  This procedure is best done when the breast is fully developed.  Changes in the breast will continue to occur over time.  Pregnancy can alter the outcomes of previous breast reduction  surgery, weight gain and weigh loss can also effect the long term appearance.   Mammogram: 06/17/19 - Negative Smoking status: Quit smoking 1 month ago, nicotine test ordered for preoperative clearance. Caprini score: 6, high, length of surgery, age, BMI, hx of emphysema. Mechanical prophylaxis intraoperatively, early ambulation, possible chemoprophylaxis.  Prescription for postop pain control, nausea, vomiting, prophylactic antibiotics sent to pharmacy.  Order for nicotine test to be sent to [facility] in [facility] for patient to obtain on 07/23/2019.  Patient is aware that she should continue to not smoke postoperatively due to increased chances of nipple necrosis, poor wound healing.  Patient is in agreement with plan.   Electronically signed by: Carola Rhine Murat Rideout, PA-C 07/14/2019 6:06 PM

## 2019-07-14 NOTE — H&P (View-Only) (Signed)
[name], female    DOB: [birth year], 54 y.o.   MRN: [id number]  Chief Complaint  Patient presents with  . Pre-op Exam    H&P SX 08/06/19 BL breast reduction      ICD-10-CM   1. Symptomatic mammary hypertrophy  N62 Nicotine/cotinine metabolites    History of Present Illness: Katherine Hayden is a 54 y.o.  female  with a history of mammary hyperplasia.  She presents for preoperative evaluation for upcoming procedure, bilateral breast reduction, scheduled for 08/06/19 with Dr. Marla Roe.  Patient is here today with her mother she would like to be a C cup, smaller than expected upon waking up.  The patient has not had problems with anesthesia.  She has a surgical history of multiple spinal surgeries, appendectomy, cholecystectomy.  She tolerated these procedures well.  She does not have any history of DVT/PE.  No family history of DVT/PE.  She is not taking any blood thinners.  No personal or family history of any bleeding or clotting disorders.   She has a past medical history of anxiety, hyperlipidemia, hypertension, GERD, emphysema, chronic pain, hypothyroidism, arthritis.  She does not have any acute symptoms at this time.  She has not had any recent colds or fevers.  Blood pressure well controlled.  No new complaints.   Patient's STN on the right is 33 cm, STN on the left is 33 cm.  IMF is 13 cm.  Patient is 5 feet 6 inches tall and 224 pounds.  Her preoperative bra size is 44D cup.  Estimated excess breast tissue removed at time of surgery is 500 g on each side.  Patient would like to be a C cup, she reports that she would like to be smaller than expected upon waking up rather than larger than expected.   Past Medical History: Allergies: Allergies  Allergen Reactions  . Pneumococcal Vaccines Itching and Other (See Comments)    Patient received Influenza and Pneumococcal vaccines at the same time-caused severe itching and needs medication to reverse  reaction, this caused her to go into cardiac arrest at that time.    Marland Kitchen Amoxicillin Hives    Did it involve swelling of the face/tongue/throat, SOB, or low BP? Yes Did it involve sudden or severe rash/hives, skin peeling, or any reaction on the inside of your mouth or nose? Yes Did you need to seek medical attention at a hospital or doctor's office? Unknown When did it last happen? 6-7 years ago If all above answers are "NO", may proceed with cephalosporin use.   . Codeine Nausea And Vomiting  . Hydromorphone Hcl Nausea And Vomiting  . Sulfonamide Derivatives Hives  . Morphine And Related Hives    Current Medications:  Current Outpatient Medications:  .  ALPRAZolam (XANAX) 1 MG tablet, Take 1 mg by mouth 4 (four) times daily as needed for anxiety. , Disp: , Rfl:  .  atorvastatin (LIPITOR) 20 MG tablet, Take 20 mg by mouth at bedtime., Disp: , Rfl:  .  baclofen (LIORESAL) 10 MG tablet, Take 10 mg by mouth in the morning and at bedtime., Disp: , Rfl:  .  betamethasone dipropionate 0.05 % cream, Apply 1 application topically 2 (two) times daily as needed., Disp: , Rfl:  .  CHANTIX 1 MG tablet, Take 1 mg by mouth daily., Disp: , Rfl:  .  dicyclomine (BENTYL) 20 MG tablet, Take 20 mg by mouth 3 (three) times daily before meals. , Disp: , Rfl:  2 .  escitalopram (LEXAPRO) 20 MG tablet, Take 20 mg by mouth daily., Disp: , Rfl:  .  esomeprazole (NEXIUM) 40 MG capsule, Take 40 mg by mouth in the morning and at bedtime. , Disp: , Rfl:  .  Fluticasone-Salmeterol (ADVAIR) 250-50 MCG/DOSE AEPB, Inhale 1 puff into the lungs 2 (two) times daily., Disp: , Rfl:  .  HYDROcodone-acetaminophen (NORCO) 10-325 MG tablet, Take 1 tablet by mouth every 6 (six) hours as needed (pain.). , Disp: , Rfl:  .  methocarbamol (ROBAXIN) 500 MG tablet, Take 500 mg by mouth 3 (three) times daily as needed for spasms., Disp: , Rfl:  .  montelukast (SINGULAIR) 10 MG tablet, Take 10 mg by mouth at bedtime., Disp: , Rfl:  .   naproxen (NAPROSYN) 500 MG tablet, Take 500 mg by mouth 2 (two) times daily., Disp: , Rfl:  .  nicotine (NICODERM CQ - DOSED IN MG/24 HOURS) 21 mg/24hr patch, Place 14 mg onto the skin daily. , Disp: , Rfl:  .  PROAIR HFA 108 (90 BASE) MCG/ACT inhaler, Inhale 2 puffs into the lungs every 4 (four) hours as needed for wheezing or shortness of breath. , Disp: , Rfl:  .  QUEtiapine (SEROQUEL) 300 MG tablet, Take 300 mg by mouth at bedtime., Disp: , Rfl:  .  RESTASIS 0.05 % ophthalmic emulsion, Place 1 drop into both eyes 2 (two) times daily., Disp: , Rfl:  .  rOPINIRole (REQUIP) 4 MG tablet, Take 4 mg by mouth at bedtime., Disp: , Rfl:  .  SYNTHROID 150 MCG tablet, Take 1 tablet (150 mcg total) by mouth daily before breakfast., Disp: 90 tablet, Rfl: 1 .  valACYclovir (VALTREX) 1000 MG tablet, Take 1,000 mg by mouth 2 (two) times daily as needed (cold sores.)., Disp: , Rfl:  .  VITAMIN D PO, Take by mouth., Disp: , Rfl:  .  Vitamin D, Ergocalciferol, (DRISDOL) 1.25 MG (50000 UNIT) CAPS capsule, Take 1 capsule (50,000 Units total) by mouth every 7 (seven) days., Disp: 4 capsule, Rfl: 0 .  ciprofloxacin (CIPRO) 500 MG tablet, Take 1 tablet (500 mg total) by mouth 2 (two) times daily., Disp: 6 tablet, Rfl: 0 .  HYDROcodone-acetaminophen (NORCO) 5-325 MG tablet, Take 1 tablet by mouth every 6 (six) hours as needed for up to 5 days for severe pain., Disp: 20 tablet, Rfl: 0 .  ondansetron (ZOFRAN) 4 MG tablet, Take 1 tablet (4 mg total) by mouth every 8 (eight) hours as needed for nausea or vomiting. (Patient not taking: Reported on 07/14/2019), Disp: 20 tablet, Rfl: 0 .  ondansetron (ZOFRAN) 4 MG tablet, Take 1 tablet (4 mg total) by mouth every 8 (eight) hours as needed for nausea or vomiting., Disp: 20 tablet, Rfl: 0  Past Medical Problems: Past Medical History:  Diagnosis Date  . Anxiety   . Arthritis   . Asthma   . Back pain   . Cervicogenic headache 08/20/2016  . Chronic headaches   . Chronic pain    . Depression   . Emphysema   . Emphysema of lung ([place])   . Fatty liver   . GERD (gastroesophageal reflux disease)   . High cholesterol   . Hypertension   . Hypothyroidism   . IBS (irritable bowel syndrome)   . Joint pain   . RLS (restless legs syndrome)   . Thyroid disease     Past Surgical History: Past Surgical History:  Procedure Laterality Date  . ANTERIOR CERVICAL DECOMP/DISCECTOMY FUSION N/A 12/17/2018  Procedure: ANTERIOR CERVICAL DECOMPRESSION/DISCECTOMY FUSION CERVIAL FIVE THROUGH SIX;  Surgeon: Melina Schools, MD;  Location: [facility];  Service: Orthopedics;  Laterality: N/A;  2.5 hrs  . APPENDECTOMY    . BACK SURGERY     x4  . BIOPSY  06/12/2019   Procedure: BIOPSY;  Surgeon: Rogene Houston, MD;  [facility];  Service: Endoscopy;;  random, sigmoid  . CHOLECYSTECTOMY  2010  . COLONOSCOPY W/ POLYPECTOMY  07/2013   1 polyp removal  . COLONOSCOPY WITH PROPOFOL N/A 06/12/2019   Procedure: COLONOSCOPY WITH PROPOFOL;  Surgeon: Rogene Houston, MD;  [facility];  Service: Endoscopy;  Laterality: N/A;  825  . CYST REMOVAL HAND  2015  . EXCISION MASS UPPER EXTREMETIES Left 02/25/2017   Procedure: EXCISION MASS UPPER EXTREMETIES, LEFT PALM;  Surgeon: Leanora Cover, MD;  Location: [facility];  Service: Orthopedics;  Laterality: Left;  . POLYPECTOMY  06/12/2019   Procedure: POLYPECTOMY;  Surgeon: Rogene Houston, MD;  [facility];  Service: Endoscopy;;  ascending;hepatic flexurex2;splenic flexure    Social History: Social History   Socioeconomic History  . Marital status: Divorced    Spouse name: Not on file  . Number of children: Not on file  . Years of education: Not on file  . Highest education level: Not on file  Occupational History  . Occupation: Disabled  Tobacco Use  . Smoking status: Current Every Day Smoker    Packs/day: 0.50    Years: 8.00    Pack years: 4.00    Types: Cigarettes  . Smokeless tobacco: Never Used   . Tobacco comment: 3 cig daily  Substance and Sexual Activity  . Alcohol use: Yes    Comment: rare  . Drug use: No  . Sexual activity: Not on file  Other Topics Concern  . Not on file  Social History Narrative  . Not on file   Social Determinants of Health   Financial Resource Strain:   . Difficulty of Paying Living Expenses:   Food Insecurity:   . Worried About Charity fundraiser in the Last Year:   . Arboriculturist in the Last Year:   Transportation Needs:   . Film/video editor (Medical):   Marland Kitchen Lack of Transportation (Non-Medical):   Physical Activity:   . Days of Exercise per Week:   . Minutes of Exercise per Session:   Stress:   . Feeling of Stress :   Social Connections:   . Frequency of Communication with Friends and Family:   . Frequency of Social Gatherings with Friends and Family:   . Attends Religious Services:   . Active Member of Clubs or Organizations:   . Attends Archivist Meetings:   Marland Kitchen Marital Status:   Intimate Partner Violence:   . Fear of Current or Ex-Partner:   . Emotionally Abused:   Marland Kitchen Physically Abused:   . Sexually Abused:     Family History: Family History  Problem Relation Age of Onset  . Hypertension Mother   . Depression Mother   . Anxiety disorder Mother   . Hypertension Father   . Heart disease Maternal Grandfather   . Heart disease Paternal Grandmother     Review of Systems: Review of Systems  Constitutional: Negative.   Respiratory: Negative.   Cardiovascular: Negative.   Gastrointestinal: Negative.   Genitourinary: Negative.   Skin: Negative.   Neurological: Negative.     Physical Exam: Vital Signs BP  122/79 (BP Location: Left Arm, Patient Position: Sitting, Cuff Size: Large)   Pulse 77   Temp 98 F (36.7 C) (Temporal)   Ht 5\' 6"  (1.676 m)   Wt 224 lb 9.6 oz (101.9 kg)   LMP 12/15/2013 (Approximate)   SpO2 97%   BMI 36.25 kg/m  Physical Exam Exam conducted with a chaperone present.   Constitutional:      General: She is not in acute distress.    Appearance: Normal appearance. She is not ill-appearing.  HENT:     Head: Normocephalic and atraumatic.  Eyes:     Pupils: Pupils are equal, round Neck:     Musculoskeletal: Normal range of motion.  Cardiovascular:     Rate and Rhythm: Normal rate and regular rhythm.     Pulses: Normal pulses.     Heart sounds: Normal heart sounds. No murmur.  Pulmonary:     Effort: Pulmonary effort is normal. No respiratory distress.     Breath sounds: Normal breath sounds. No wheezing.  Abdominal:     General: Abdomen is flat. There is no distension.     Palpations: Abdomen is soft.     Tenderness: There is no abdominal tenderness.  Musculoskeletal: Normal range of motion.  Skin:    General: Skin is warm and dry.     Findings: No erythema or rash.  Neurological:     General: No focal deficit present.     Mental Status: She is alert and oriented to person, place, and time. Mental status is at baseline.     Motor: No weakness.  Psychiatric:        Mood and Affect: Mood normal.        Behavior: Behavior normal.    Assessment/Plan: The patient is scheduled for bilateral breast reduction with Dr. Marla Roe on 08/06/2019.  Risks, benefits, and alternatives of procedure discussed, questions answered and consent obtained.    Patient provided with general consent form covering surgical risks associated with bilateral breast reduction and general surgical intervention. Patient was allocated adequate time prior to appointment to read through, initial and sign that they agree that they are aware of the risks associated. We also covered the risks in person during this pre-op appointment. I answered all of the patient's questions to their content and informed them to call with any further questions or concerns prior to surgery and I would be happy to discuss with them further.  The risk that can be encountered with breast reduction were  discussed and include the following but not limited to these:  Breast asymmetry, fluid accumulation, firmness of the breast, inability to breast feed, loss of nipple or areola, skin loss, decrease or no nipple sensation, fat necrosis of the breast tissue, bleeding, infection, healing delay.  There are risks of anesthesia, changes to skin sensation and injury to nerves or blood vessels.  The muscle can be temporarily or permanently injured.  You may have an allergic reaction to tape, suture, glue, blood products which can result in skin discoloration, swelling, pain, skin lesions, poor healing.  Any of these can lead to the need for revisonal surgery or stage procedures.  A reduction has potential to interfere with diagnostic procedures.  Nipple or breast piercing can increase risks of infection.  This procedure is best done when the breast is fully developed.  Changes in the breast will continue to occur over time.  Pregnancy can alter the outcomes of previous breast reduction surgery, weight gain and weigh loss can  also effect the long term appearance.   Mammogram: 06/17/19 - Negative Smoking status: Quit smoking 1 month ago, nicotine test ordered for preoperative clearance. Caprini score: 6, high, length of surgery, age, BMI, hx of emphysema. Mechanical prophylaxis intraoperatively, early ambulation, possible chemoprophylaxis.  Prescription for postop pain control, nausea, vomiting, prophylactic antibiotics sent to pharmacy.  Order for nicotine test to be sent to [facility] in [facility] for patient to obtain on 07/23/2019.  Patient is aware that she should continue to not smoke postoperatively due to increased chances of nipple necrosis, poor wound healing.  Patient is in agreement with plan.   Electronically signed by: Carola Rhine Careen Mauch, PA-C 07/14/2019 6:06 PM

## 2019-07-15 ENCOUNTER — Telehealth: Payer: Self-pay | Admitting: *Deleted

## 2019-07-15 NOTE — Telephone Encounter (Signed)
Called and [facility]) asking the patient to RTC regarding lab order for blood work.//AB/CMA

## 2019-07-16 ENCOUNTER — Telehealth: Payer: Self-pay | Admitting: Plastic Surgery

## 2019-07-16 NOTE — Telephone Encounter (Signed)
Pt lvm regarding blood work Public librarian wanted her to have. Please call her back to advise.

## 2019-07-16 NOTE — Telephone Encounter (Signed)
Called and spoke with the patient this morning.//AB/CMA

## 2019-07-16 NOTE — Telephone Encounter (Signed)
Called and spoke with patient and informed her that [place],PA-C ordered for her to go and have a nicotine test before her surgery (08/06/19).  It will need to be completed on (07/23/19) in [place] at the [place] station.  Informed the patient that she will need to go and have the lab work done as soon as she can because if take about 2 weeks to get the results back.  Patient stated that she may be able to go today.  Give the patient day and times the lab will be open.  Informed her that I will faxed the lab order to [facility] and they should have it when she goes, but if they don't please give me a call while she's there and I will refax the order.   Patient verbalized understanding and agreed.  Order faxed and confirmation received.  Waiting on results.

## 2019-07-17 ENCOUNTER — Other Ambulatory Visit: Payer: Self-pay | Admitting: Surgical

## 2019-07-17 DIAGNOSIS — F17200 Nicotine dependence, unspecified, uncomplicated: Secondary | ICD-10-CM | POA: Diagnosis not present

## 2019-07-17 DIAGNOSIS — N62 Hypertrophy of breast: Secondary | ICD-10-CM | POA: Diagnosis not present

## 2019-07-26 LAB — NICOTINE/COTININE METABOLITES
Cotinine: <1 ng/mL
Nicotine: <1 ng/mL

## 2019-07-28 DIAGNOSIS — M961 Postlaminectomy syndrome, not elsewhere classified: Secondary | ICD-10-CM | POA: Diagnosis not present

## 2019-07-28 DIAGNOSIS — M5136 Other intervertebral disc degeneration, lumbar region: Secondary | ICD-10-CM | POA: Diagnosis not present

## 2019-07-28 DIAGNOSIS — M5030 Other cervical disc degeneration, unspecified cervical region: Secondary | ICD-10-CM | POA: Diagnosis not present

## 2019-07-29 ENCOUNTER — Encounter (HOSPITAL_BASED_OUTPATIENT_CLINIC_OR_DEPARTMENT_OTHER): Payer: Self-pay | Admitting: Plastic Surgery

## 2019-07-29 ENCOUNTER — Other Ambulatory Visit: Payer: Self-pay

## 2019-08-03 ENCOUNTER — Other Ambulatory Visit: Payer: Self-pay

## 2019-08-03 ENCOUNTER — Other Ambulatory Visit (HOSPITAL_COMMUNITY): Payer: Medicare Other

## 2019-08-03 ENCOUNTER — Other Ambulatory Visit (HOSPITAL_COMMUNITY)
Admission: RE | Admit: 2019-08-03 | Discharge: 2019-08-03 | Disposition: A | Discharge status: Home / Self Care | Payer: Medicare Other | Source: Ambulatory Visit | Attending: Plastic Surgery | Admitting: Plastic Surgery

## 2019-08-03 DIAGNOSIS — Z20822 Contact with and (suspected) exposure to covid-19: Secondary | ICD-10-CM | POA: Insufficient documentation

## 2019-08-03 DIAGNOSIS — Z01812 Encounter for preprocedural laboratory examination: Secondary | ICD-10-CM | POA: Insufficient documentation

## 2019-08-03 LAB — SARS CORONAVIRUS 2 (TAT 6-24 HRS): SARS Coronavirus 2: NEGATIVE

## 2019-08-03 NOTE — Telephone Encounter (Signed)
Spoke with the patient and informed her that her Nicotine blood test was normal.  Informed her great job and to keep up the good work.  Patient verbalized understanding and agreed, and stated that she will not be picking them back up.//AB/CMA

## 2019-08-03 NOTE — Telephone Encounter (Signed)
Called and [facility] @ 3:10pm) asking the patient to RTC regarding lab result.

## 2019-08-04 ENCOUNTER — Ambulatory Visit: Payer: Medicare Other | Admitting: "Endocrinology

## 2019-08-06 ENCOUNTER — Encounter (HOSPITAL_BASED_OUTPATIENT_CLINIC_OR_DEPARTMENT_OTHER)
Admission: RE | Disposition: A | Discharge status: Home / Self Care | Payer: Self-pay | Source: Home / Self Care | Attending: Plastic Surgery

## 2019-08-06 ENCOUNTER — Ambulatory Visit (HOSPITAL_BASED_OUTPATIENT_CLINIC_OR_DEPARTMENT_OTHER): Payer: Medicare Other | Admitting: Certified Registered Nurse Anesthetist

## 2019-08-06 ENCOUNTER — Observation Stay (HOSPITAL_BASED_OUTPATIENT_CLINIC_OR_DEPARTMENT_OTHER)
Admission: RE | Admit: 2019-08-06 | Discharge: 2019-08-07 | Disposition: A | Discharge status: Home / Self Care | Payer: Medicare Other | Attending: Plastic Surgery | Admitting: Plastic Surgery

## 2019-08-06 ENCOUNTER — Encounter (HOSPITAL_BASED_OUTPATIENT_CLINIC_OR_DEPARTMENT_OTHER): Payer: Self-pay | Admitting: Plastic Surgery

## 2019-08-06 ENCOUNTER — Other Ambulatory Visit: Payer: Self-pay

## 2019-08-06 DIAGNOSIS — M546 Pain in thoracic spine: Secondary | ICD-10-CM | POA: Diagnosis not present

## 2019-08-06 DIAGNOSIS — K219 Gastro-esophageal reflux disease without esophagitis: Secondary | ICD-10-CM | POA: Insufficient documentation

## 2019-08-06 DIAGNOSIS — Z7951 Long term (current) use of inhaled steroids: Secondary | ICD-10-CM | POA: Diagnosis not present

## 2019-08-06 DIAGNOSIS — Z87891 Personal history of nicotine dependence: Secondary | ICD-10-CM | POA: Diagnosis not present

## 2019-08-06 DIAGNOSIS — M542 Cervicalgia: Secondary | ICD-10-CM | POA: Insufficient documentation

## 2019-08-06 DIAGNOSIS — G8929 Other chronic pain: Secondary | ICD-10-CM | POA: Insufficient documentation

## 2019-08-06 DIAGNOSIS — Z79890 Hormone replacement therapy (postmenopausal): Secondary | ICD-10-CM | POA: Insufficient documentation

## 2019-08-06 DIAGNOSIS — F419 Anxiety disorder, unspecified: Secondary | ICD-10-CM | POA: Diagnosis not present

## 2019-08-06 DIAGNOSIS — Z791 Long term (current) use of non-steroidal anti-inflammatories (NSAID): Secondary | ICD-10-CM | POA: Diagnosis not present

## 2019-08-06 DIAGNOSIS — Z885 Allergy status to narcotic agent status: Secondary | ICD-10-CM | POA: Diagnosis not present

## 2019-08-06 DIAGNOSIS — N62 Hypertrophy of breast: Principal | ICD-10-CM | POA: Diagnosis present

## 2019-08-06 DIAGNOSIS — J439 Emphysema, unspecified: Secondary | ICD-10-CM | POA: Insufficient documentation

## 2019-08-06 DIAGNOSIS — Z887 Allergy status to serum and vaccine status: Secondary | ICD-10-CM | POA: Diagnosis not present

## 2019-08-06 DIAGNOSIS — Z882 Allergy status to sulfonamides status: Secondary | ICD-10-CM | POA: Insufficient documentation

## 2019-08-06 DIAGNOSIS — K589 Irritable bowel syndrome without diarrhea: Secondary | ICD-10-CM | POA: Diagnosis not present

## 2019-08-06 DIAGNOSIS — G2581 Restless legs syndrome: Secondary | ICD-10-CM | POA: Insufficient documentation

## 2019-08-06 DIAGNOSIS — I10 Essential (primary) hypertension: Secondary | ICD-10-CM | POA: Insufficient documentation

## 2019-08-06 DIAGNOSIS — E039 Hypothyroidism, unspecified: Secondary | ICD-10-CM | POA: Diagnosis not present

## 2019-08-06 DIAGNOSIS — Z888 Allergy status to other drugs, medicaments and biological substances status: Secondary | ICD-10-CM | POA: Insufficient documentation

## 2019-08-06 DIAGNOSIS — M549 Dorsalgia, unspecified: Secondary | ICD-10-CM | POA: Insufficient documentation

## 2019-08-06 DIAGNOSIS — Z79899 Other long term (current) drug therapy: Secondary | ICD-10-CM | POA: Insufficient documentation

## 2019-08-06 DIAGNOSIS — F329 Major depressive disorder, single episode, unspecified: Secondary | ICD-10-CM | POA: Insufficient documentation

## 2019-08-06 DIAGNOSIS — Z880 Allergy status to penicillin: Secondary | ICD-10-CM | POA: Diagnosis not present

## 2019-08-06 DIAGNOSIS — E785 Hyperlipidemia, unspecified: Secondary | ICD-10-CM | POA: Diagnosis not present

## 2019-08-06 HISTORY — PX: BREAST REDUCTION SURGERY: SHX8

## 2019-08-06 SURGERY — BREAST REDUCTION WITH LIPOSUCTION
Anesthesia: General | Site: Breast | Laterality: Bilateral

## 2019-08-06 MED ORDER — POLYETHYLENE GLYCOL 3350 17 G PO PACK: 17.0000 g | PACK | Freq: Every day | ORAL | Status: DC | PRN

## 2019-08-06 MED ORDER — LIDOCAINE 2% (20 MG/ML) 5 ML SYRINGE
INTRAMUSCULAR | Status: DC | PRN
  Administered 2019-08-06: 80 mg via INTRAVENOUS

## 2019-08-06 MED ORDER — ACETAMINOPHEN 500 MG PO TABS
1000.0000 mg | ORAL_TABLET | Freq: Once | ORAL | Status: AC
  Administered 2019-08-06: 08:00:00 1000 mg via ORAL

## 2019-08-06 MED ORDER — LACTATED RINGERS IV SOLN: INTRAVENOUS | Status: DC

## 2019-08-06 MED ORDER — HYDROMORPHONE HCL 1 MG/ML IJ SOLN
INTRAMUSCULAR | Status: AC
  Filled 2019-08-06: qty 0.5

## 2019-08-06 MED ORDER — TRIAMCINOLONE ACETONIDE 0.5 % EX CREA
TOPICAL_CREAM | CUTANEOUS | Status: DC | PRN
  Administered 2019-08-06: 1 via TOPICAL

## 2019-08-06 MED ORDER — ACETAMINOPHEN 325 MG PO TABS
325.0000 mg | ORAL_TABLET | Freq: Four times a day (QID) | ORAL | Status: DC
  Administered 2019-08-06 – 2019-08-07 (×2): 325 mg via ORAL
  Filled 2019-08-06 (×2): qty 1

## 2019-08-06 MED ORDER — PHENYLEPHRINE 40 MCG/ML (10ML) SYRINGE FOR IV PUSH (FOR BLOOD PRESSURE SUPPORT)
PREFILLED_SYRINGE | INTRAVENOUS | Status: DC | PRN
  Administered 2019-08-06 (×7): 80 ug via INTRAVENOUS

## 2019-08-06 MED ORDER — BUPIVACAINE HCL (PF) 0.25 % IJ SOLN
INTRAMUSCULAR | Status: AC
  Filled 2019-08-06: qty 30

## 2019-08-06 MED ORDER — ONDANSETRON HCL 4 MG/2ML IJ SOLN
INTRAMUSCULAR | Status: AC
  Filled 2019-08-06: qty 2

## 2019-08-06 MED ORDER — PROMETHAZINE HCL 25 MG/ML IJ SOLN
6.2500 mg | Freq: Once | INTRAMUSCULAR | Status: AC
  Administered 2019-08-06: 14:00:00 6.25 mg via INTRAVENOUS

## 2019-08-06 MED ORDER — EPHEDRINE 5 MG/ML INJ
INTRAVENOUS | Status: AC
  Filled 2019-08-06: qty 10

## 2019-08-06 MED ORDER — MIDAZOLAM HCL 2 MG/2ML IJ SOLN
INTRAMUSCULAR | Status: AC
  Filled 2019-08-06: qty 2

## 2019-08-06 MED ORDER — ROPINIROLE HCL ER 4 MG PO TB24
4.0000 mg | ORAL_TABLET | Freq: Every day | ORAL | Status: DC
  Filled 2019-08-06: qty 1

## 2019-08-06 MED ORDER — PROPOFOL 10 MG/ML IV BOLUS
INTRAVENOUS | Status: DC | PRN
  Administered 2019-08-06: 200 mg via INTRAVENOUS

## 2019-08-06 MED ORDER — FENTANYL CITRATE (PF) 100 MCG/2ML IJ SOLN
INTRAMUSCULAR | Status: AC
  Filled 2019-08-06: qty 2

## 2019-08-06 MED ORDER — ALBUTEROL SULFATE HFA 108 (90 BASE) MCG/ACT IN AERS
INHALATION_SPRAY | RESPIRATORY_TRACT | Status: DC | PRN
Start: 2019-08-06 — End: 2019-08-06
  Administered 2019-08-06 (×2): 2 via RESPIRATORY_TRACT

## 2019-08-06 MED ORDER — SODIUM CHLORIDE 0.9% FLUSH: 3.0000 mL | Freq: Two times a day (BID) | INTRAVENOUS | Status: DC

## 2019-08-06 MED ORDER — CIPROFLOXACIN IN D5W 400 MG/200ML IV SOLN
INTRAVENOUS | Status: AC
  Filled 2019-08-06: qty 200

## 2019-08-06 MED ORDER — EPINEPHRINE PF 1 MG/ML IJ SOLN
INTRAMUSCULAR | Status: AC
  Filled 2019-08-06: qty 1

## 2019-08-06 MED ORDER — CHLORHEXIDINE GLUCONATE CLOTH 2 % EX PADS: 6.0000 | MEDICATED_PAD | Freq: Once | CUTANEOUS | Status: DC

## 2019-08-06 MED ORDER — KETAMINE HCL 10 MG/ML IJ SOLN
INTRAMUSCULAR | Status: DC | PRN
  Administered 2019-08-06: 30 mg via INTRAVENOUS
  Administered 2019-08-06: 20 mg via INTRAVENOUS

## 2019-08-06 MED ORDER — KETAMINE HCL 100 MG/ML IJ SOLN
INTRAMUSCULAR | Status: AC
  Filled 2019-08-06: qty 1

## 2019-08-06 MED ORDER — PHENYLEPHRINE 40 MCG/ML (10ML) SYRINGE FOR IV PUSH (FOR BLOOD PRESSURE SUPPORT)
PREFILLED_SYRINGE | INTRAVENOUS | Status: AC
  Filled 2019-08-06: qty 10

## 2019-08-06 MED ORDER — ALBUTEROL SULFATE HFA 108 (90 BASE) MCG/ACT IN AERS
INHALATION_SPRAY | RESPIRATORY_TRACT | Status: AC
  Filled 2019-08-06: qty 6.7

## 2019-08-06 MED ORDER — KCL IN DEXTROSE-NACL 20-5-0.45 MEQ/L-%-% IV SOLN
INTRAVENOUS | Status: DC
  Filled 2019-08-06: qty 1000

## 2019-08-06 MED ORDER — SODIUM CHLORIDE 0.9% FLUSH: 3.0000 mL | INTRAVENOUS | Status: DC | PRN

## 2019-08-06 MED ORDER — EPHEDRINE SULFATE-NACL 50-0.9 MG/10ML-% IV SOSY
PREFILLED_SYRINGE | INTRAVENOUS | Status: DC | PRN
  Administered 2019-08-06: 5 mg via INTRAVENOUS
  Administered 2019-08-06 (×3): 10 mg via INTRAVENOUS

## 2019-08-06 MED ORDER — DIPHENHYDRAMINE HCL 50 MG/ML IJ SOLN: 12.5000 mg | Freq: Four times a day (QID) | INTRAMUSCULAR | Status: DC | PRN

## 2019-08-06 MED ORDER — DEXAMETHASONE SODIUM PHOSPHATE 10 MG/ML IJ SOLN
INTRAMUSCULAR | Status: AC
  Filled 2019-08-06: qty 1

## 2019-08-06 MED ORDER — ROCURONIUM BROMIDE 10 MG/ML (PF) SYRINGE
PREFILLED_SYRINGE | INTRAVENOUS | Status: AC
  Filled 2019-08-06: qty 10

## 2019-08-06 MED ORDER — CIPROFLOXACIN IN D5W 400 MG/200ML IV SOLN
400.0000 mg | Freq: Two times a day (BID) | INTRAVENOUS | Status: DC
  Administered 2019-08-06: 22:00:00 400 mg via INTRAVENOUS
  Filled 2019-08-06: qty 200

## 2019-08-06 MED ORDER — ONDANSETRON HCL 4 MG/2ML IJ SOLN
INTRAMUSCULAR | Status: DC | PRN
  Administered 2019-08-06: 4 mg via INTRAVENOUS

## 2019-08-06 MED ORDER — CIPROFLOXACIN IN D5W 400 MG/200ML IV SOLN
400.0000 mg | INTRAVENOUS | Status: AC
  Administered 2019-08-06: 400 mg via INTRAVENOUS

## 2019-08-06 MED ORDER — HYDROMORPHONE HCL 1 MG/ML IJ SOLN
0.5000 mg | Freq: Once | INTRAMUSCULAR | Status: AC
  Administered 2019-08-06: 0.5 mg via INTRAVENOUS

## 2019-08-06 MED ORDER — ALPRAZOLAM 0.25 MG PO TABS
1.0000 mg | ORAL_TABLET | Freq: Three times a day (TID) | ORAL | Status: DC
  Administered 2019-08-06 (×2): 1 mg via ORAL
  Filled 2019-08-06 (×2): qty 4

## 2019-08-06 MED ORDER — MIDAZOLAM HCL 2 MG/2ML IJ SOLN
1.0000 mg | Freq: Once | INTRAMUSCULAR | Status: AC
  Administered 2019-08-06: 13:00:00 1 mg via INTRAVENOUS

## 2019-08-06 MED ORDER — LIDOCAINE HCL (PF) 1 % IJ SOLN
INTRAMUSCULAR | Status: AC
  Filled 2019-08-06: qty 60

## 2019-08-06 MED ORDER — LIDOCAINE HCL 1 % IJ SOLN
INTRAVENOUS | Status: DC | PRN
  Administered 2019-08-06: 500 mL

## 2019-08-06 MED ORDER — ROCURONIUM BROMIDE 50 MG/5ML IV SOSY
PREFILLED_SYRINGE | INTRAVENOUS | Status: DC | PRN
  Administered 2019-08-06: 100 mg via INTRAVENOUS

## 2019-08-06 MED ORDER — SENNA 8.6 MG PO TABS
1.0000 | ORAL_TABLET | Freq: Two times a day (BID) | ORAL | Status: DC
  Administered 2019-08-06: 22:00:00 8.6 mg via ORAL
  Filled 2019-08-06: qty 1

## 2019-08-06 MED ORDER — PROMETHAZINE HCL 25 MG/ML IJ SOLN
INTRAMUSCULAR | Status: AC
  Filled 2019-08-06: qty 1

## 2019-08-06 MED ORDER — HYDROCODONE-ACETAMINOPHEN 5-325 MG PO TABS
1.0000 | ORAL_TABLET | Freq: Four times a day (QID) | ORAL | Status: DC | PRN
  Administered 2019-08-06: 16:00:00 1 via ORAL
  Filled 2019-08-06: qty 1

## 2019-08-06 MED ORDER — FENTANYL CITRATE (PF) 100 MCG/2ML IJ SOLN
25.0000 ug | INTRAMUSCULAR | Status: DC | PRN
  Administered 2019-08-06 (×2): 50 ug via INTRAVENOUS
  Administered 2019-08-06 (×2): 25 ug via INTRAVENOUS

## 2019-08-06 MED ORDER — DIPHENHYDRAMINE HCL 12.5 MG/5ML PO ELIX: 12.5000 mg | ORAL_SOLUTION | Freq: Four times a day (QID) | ORAL | Status: DC | PRN

## 2019-08-06 MED ORDER — LIDOCAINE-EPINEPHRINE 1 %-1:100000 IJ SOLN
INTRAMUSCULAR | Status: DC | PRN
  Administered 2019-08-06: 20 mL

## 2019-08-06 MED ORDER — MIDAZOLAM HCL 2 MG/2ML IJ SOLN
INTRAMUSCULAR | Status: DC | PRN
  Administered 2019-08-06: 2 mg via INTRAVENOUS

## 2019-08-06 MED ORDER — FENTANYL CITRATE (PF) 100 MCG/2ML IJ SOLN
INTRAMUSCULAR | Status: DC | PRN
  Administered 2019-08-06 (×2): 50 ug via INTRAVENOUS
  Administered 2019-08-06: 100 ug via INTRAVENOUS

## 2019-08-06 MED ORDER — ACETAMINOPHEN 80 MG RE SUPP: 650.0000 mg | RECTAL | Status: DC | PRN

## 2019-08-06 MED ORDER — ONDANSETRON 4 MG PO TBDP: 4.0000 mg | ORAL_TABLET | Freq: Four times a day (QID) | ORAL | Status: DC | PRN

## 2019-08-06 MED ORDER — OXYCODONE HCL 5 MG PO TABS
5.0000 mg | ORAL_TABLET | ORAL | Status: DC | PRN
  Administered 2019-08-06 – 2019-08-07 (×2): 5 mg via ORAL
  Filled 2019-08-06: qty 2

## 2019-08-06 MED ORDER — METHOCARBAMOL 500 MG PO TABS
500.0000 mg | ORAL_TABLET | Freq: Four times a day (QID) | ORAL | Status: DC | PRN
  Administered 2019-08-06 – 2019-08-07 (×3): 500 mg via ORAL
  Filled 2019-08-06 (×3): qty 1

## 2019-08-06 MED ORDER — DEXAMETHASONE SODIUM PHOSPHATE 10 MG/ML IJ SOLN
INTRAMUSCULAR | Status: DC | PRN
  Administered 2019-08-06: 10 mg via INTRAVENOUS

## 2019-08-06 MED ORDER — ONDANSETRON HCL 4 MG/2ML IJ SOLN: 4.0000 mg | Freq: Four times a day (QID) | INTRAMUSCULAR | Status: DC | PRN

## 2019-08-06 MED ORDER — SODIUM CHLORIDE 0.9 % IV SOLN: 250.0000 mL | INTRAVENOUS | Status: DC | PRN

## 2019-08-06 MED ORDER — SUGAMMADEX SODIUM 200 MG/2ML IV SOLN
INTRAVENOUS | Status: DC | PRN
  Administered 2019-08-06: 200 mg via INTRAVENOUS

## 2019-08-06 MED ORDER — LIDOCAINE-EPINEPHRINE 1 %-1:100000 IJ SOLN
INTRAMUSCULAR | Status: AC
  Filled 2019-08-06: qty 1

## 2019-08-06 MED ORDER — ACETAMINOPHEN 500 MG PO TABS
ORAL_TABLET | ORAL | Status: AC
  Filled 2019-08-06: qty 2

## 2019-08-06 MED ORDER — ACETAMINOPHEN 325 MG PO TABS: 650.0000 mg | ORAL_TABLET | ORAL | Status: DC | PRN

## 2019-08-06 MED ORDER — PROPOFOL 500 MG/50ML IV EMUL
INTRAVENOUS | Status: AC
  Filled 2019-08-06: qty 50

## 2019-08-06 MED ORDER — LIDOCAINE 2% (20 MG/ML) 5 ML SYRINGE
INTRAMUSCULAR | Status: AC
  Filled 2019-08-06: qty 5

## 2019-08-06 SURGICAL SUPPLY — 70 items
ADH SKN CLS APL DERMABOND .7 (GAUZE/BANDAGES/DRESSINGS) ×2
BAG DECANTER FOR FLEXI CONT (MISCELLANEOUS) ×1 IMPLANT
BINDER BREAST LRG (GAUZE/BANDAGES/DRESSINGS) IMPLANT
BINDER BREAST MEDIUM (GAUZE/BANDAGES/DRESSINGS) IMPLANT
BINDER BREAST XLRG (GAUZE/BANDAGES/DRESSINGS) IMPLANT
BINDER BREAST XXLRG (GAUZE/BANDAGES/DRESSINGS) ×1 IMPLANT
BIOPATCH RED 1 DISK 7.0 (GAUZE/BANDAGES/DRESSINGS) IMPLANT
BLADE HEX COATED 2.75 (ELECTRODE) ×2 IMPLANT
BLADE KNIFE PERSONA 10 (BLADE) ×4 IMPLANT
BLADE SURG 15 STRL LF DISP TIS (BLADE) IMPLANT
BLADE SURG 15 STRL SS (BLADE)
CANISTER SUCT 1200ML W/VALVE (MISCELLANEOUS) ×2 IMPLANT
COVER BACK TABLE 60X90IN (DRAPES) ×2 IMPLANT
COVER MAYO STAND STRL (DRAPES) ×2 IMPLANT
COVER WAND RF STERILE (DRAPES) IMPLANT
DECANTER SPIKE VIAL GLASS SM (MISCELLANEOUS) IMPLANT
DERMABOND ADVANCED (GAUZE/BANDAGES/DRESSINGS) ×2
DERMABOND ADVANCED .7 DNX12 (GAUZE/BANDAGES/DRESSINGS) IMPLANT
DRAIN CHANNEL 19F RND (DRAIN) IMPLANT
DRAPE LAPAROSCOPIC ABDOMINAL (DRAPES) ×2 IMPLANT
DRSG OPSITE POSTOP 4X6 (GAUZE/BANDAGES/DRESSINGS) ×1 IMPLANT
DRSG PAD ABDOMINAL 8X10 ST (GAUZE/BANDAGES/DRESSINGS) ×4 IMPLANT
ELECT BLADE 4.0 EZ CLEAN MEGAD (MISCELLANEOUS) ×2
ELECT REM PT RETURN 9FT ADLT (ELECTROSURGICAL) ×2
ELECTRODE BLDE 4.0 EZ CLN MEGD (MISCELLANEOUS) IMPLANT
ELECTRODE REM PT RTRN 9FT ADLT (ELECTROSURGICAL) ×1 IMPLANT
EVACUATOR SILICONE 100CC (DRAIN) IMPLANT
GAUZE SPONGE 4X4 12PLY STRL LF (GAUZE/BANDAGES/DRESSINGS) IMPLANT
GLOVE BIO SURGEON STRL SZ 6.5 (GLOVE) ×8 IMPLANT
GLOVE BIO SURGEON STRL SZ7 (GLOVE) ×2 IMPLANT
GLOVE BIOGEL PI IND STRL 6.5 (GLOVE) IMPLANT
GLOVE BIOGEL PI IND STRL 7.0 (GLOVE) IMPLANT
GLOVE BIOGEL PI INDICATOR 6.5 (GLOVE) ×1
GLOVE BIOGEL PI INDICATOR 7.0 (GLOVE) ×1
GOWN STRL REUS W/ TWL LRG LVL3 (GOWN DISPOSABLE) ×3 IMPLANT
GOWN STRL REUS W/ TWL XL LVL3 (GOWN DISPOSABLE) IMPLANT
GOWN STRL REUS W/TWL LRG LVL3 (GOWN DISPOSABLE) ×4
GOWN STRL REUS W/TWL XL LVL3 (GOWN DISPOSABLE) ×2
NDL HYPO 25X1 1.5 SAFETY (NEEDLE) ×1 IMPLANT
NDL SAFETY ECLIPSE 18X1.5 (NEEDLE) IMPLANT
NEEDLE HYPO 18GX1.5 SHARP (NEEDLE) ×2
NEEDLE HYPO 25X1 1.5 SAFETY (NEEDLE) ×2 IMPLANT
NS IRRIG 1000ML POUR BTL (IV SOLUTION) ×1 IMPLANT
PAD ALCOHOL SWAB (MISCELLANEOUS) IMPLANT
PENCIL SMOKE EVACUATOR (MISCELLANEOUS) ×2 IMPLANT
PIN SAFETY STERILE (MISCELLANEOUS) IMPLANT
SET BASIN DAY SURGERY F.S. (CUSTOM PROCEDURE TRAY) ×2 IMPLANT
SLEEVE SCD COMPRESS KNEE MED (MISCELLANEOUS) ×2 IMPLANT
SPONGE LAP 18X18 RF (DISPOSABLE) ×4 IMPLANT
STRIP SUTURE WOUND CLOSURE 1/2 (MISCELLANEOUS) ×5 IMPLANT
SUT MNCRL AB 4-0 PS2 18 (SUTURE) ×10 IMPLANT
SUT MON AB 3-0 SH 27 (SUTURE) ×10
SUT MON AB 3-0 SH27 (SUTURE) ×3 IMPLANT
SUT MON AB 5-0 PS2 18 (SUTURE) ×6 IMPLANT
SUT PDS 3-0 CT2 (SUTURE)
SUT PDS AB 2-0 CT2 27 (SUTURE) IMPLANT
SUT PDS II 3-0 CT2 27 ABS (SUTURE) IMPLANT
SUT SILK 3 0 PS 1 (SUTURE) IMPLANT
SYR 3ML 23GX1 SAFETY (SYRINGE) IMPLANT
SYR 50ML LL SCALE MARK (SYRINGE) ×1 IMPLANT
SYR BULB IRRIGATION 50ML (SYRINGE) ×2 IMPLANT
SYR CONTROL 10ML LL (SYRINGE) ×2 IMPLANT
TAPE MEASURE VINYL STERILE (MISCELLANEOUS) IMPLANT
TOWEL GREEN STERILE FF (TOWEL DISPOSABLE) ×5 IMPLANT
TRAY DSU PREP LF (CUSTOM PROCEDURE TRAY) ×2 IMPLANT
TUBE CONNECTING 20X1/4 (TUBING) ×2 IMPLANT
TUBING INFILTRATION IT-10001 (TUBING) ×1 IMPLANT
TUBING SET GRADUATE ASPIR 12FT (MISCELLANEOUS) ×1 IMPLANT
UNDERPAD 30X36 HEAVY ABSORB (UNDERPADS AND DIAPERS) ×4 IMPLANT
YANKAUER SUCT BULB TIP NO VENT (SUCTIONS) ×2 IMPLANT

## 2019-08-06 NOTE — Interval H&P Note (Signed)
History and Physical Interval Note:  08/06/2019 9:25 AM  Katherine Hayden  has presented today for surgery, with the diagnosis of mammary hypertrophy.  The various methods of treatment have been discussed with the patient and family. After consideration of risks, benefits and other options for treatment, the patient has consented to  Procedure(s) with comments: BILATERAL MAMMARY REDUCTION  (BREAST) (Bilateral) - 3 hours as a surgical intervention.  The patient's history has been reviewed, patient examined, no change in status, stable for surgery.  I have reviewed the patient's chart and labs.  Questions were answered to the patient's satisfaction.     [name]

## 2019-08-06 NOTE — Discharge Instructions (Addendum)
INSTRUCTIONS FOR AFTER SURGERY   You will likely have some questions about what to expect following your operation.  The following information will help you and your family understand what to expect when you are discharged from the hospital.  Following these guidelines will help ensure a smooth recovery and reduce risks of complications.  Postoperative instructions include information on: diet, wound care, medications and physical activity.  AFTER SURGERY Expect to go home after the procedure.  In some cases, you may need to spend one night in the hospital for observation.  DIET This surgery does not require a specific diet.  However, I have to mention that the healthier you eat the better your body can start healing. It is important to increasing your protein intake.  This means limiting the foods with added sugar.  Focus on fruits and vegetables and some meat.  If you have any liposuction during your procedure be sure to drink water.  If your urine is bright yellow, then it is concentrated, and you need to drink more water.  As a general rule after surgery, you should have 8 ounces of water every hour while awake.  If you find you are persistently nauseated or unable to take in liquids let us know.  NO TOBACCO USE or EXPOSURE.  This will slow your healing process and increase the risk of a wound.  WOUND CARE If you don't have a drain: You can shower the day after surgery.  Use fragrance free soap.  Dial, Dove, Ivory and Cetaphil are usually mild on the skin.  If you have a drain: Clean with baby wipes until the drain is removed.   If you have steri-strips / tape directly attached to your skin leave them in place. It is OK to get these wet.  No baths, pools or hot tubs for two weeks. We close your incision to leave the smallest and best-looking scar. No ointment or creams on your incisions until given the go ahead.  Especially not Neosporin (Too many skin reactions with this one).  A few weeks after  surgery you can use Mederma and start massaging the scar. We ask you to wear your binder or sports bra for the first 6 weeks around the clock, including while sleeping. This provides added comfort and helps reduce the fluid accumulation at the surgery site.  ACTIVITY No heavy lifting until cleared by the doctor.  It is OK to walk and climb stairs. In fact, moving your legs is very important to decrease your risk of a blood clot.  It will also help keep you from getting deconditioned.  Every 1 to 2 hours get up and walk for 5 minutes. This will help with a quicker recovery back to normal.  Let pain be your guide so you don't do too much.  NO, you cannot do the spring cleaning and don't plan on taking care of anyone else.  This is your time for TLC.   WORK Everyone returns to work at different times. As a rough guide, most people take at least 1 - 2 weeks off prior to returning to work. If you need documentation for your job, bring the forms to your postoperative follow up visit.  DRIVING Arrange for someone to bring you home from the hospital.  You may be able to drive a few days after surgery but not while taking any narcotics or valium.  BOWEL MOVEMENTS Constipation can occur after anesthesia and while taking pain medication.  It is important   to stay ahead for your comfort.  We recommend taking Milk of Magnesia (2 tablespoons; twice a day) while taking the pain pills.  SEROMA This is fluid your body tried to put in the surgical site.  This is normal but if it creates excessive pain and swelling let us know.  It usually decreases in a few weeks.  MEDICATIONS and PAIN CONTROL At your preoperative visit for you history and physical you were given the following medications: 1. An antibiotic: Start this medication when you get home and take according to the instructions on the bottle. 2. Zofran 4 mg:  This is to treat nausea and vomiting.  You can take this every 6 hours as needed and only if  needed. 3. Norco (hydrocodone/acetaminophen) 5/325 mg:  This is only to be used after you have taken the motrin or the tylenol. Every 8 hours as needed. Over the counter Medication to take: 4. Ibuprofen (Motrin) 600 mg:  Take this every 6 hours.  If you have additional pain then take 500 mg of the tylenol.  Only take the Norco after you have tried these two. 5. Miralax or stool softener of choice: Take this according to the bottle if you take the [place] Call your surgeon's office if any of the following occur: . Fever 101 degrees F or greater . Excessive bleeding or fluid from the incision site. . Pain that increases over time without aid from the medications . Redness, warmth, or pus draining from incision sites . Persistent nausea or inability to take in liquids . Severe misshapen area that underwent the operation.    Post Anesthesia Home Care Instructions  Activity: Get plenty of rest for the remainder of the day. A responsible individual must stay with you for 24 hours following the procedure.  For the next 24 hours, DO NOT: -Drive a car -Paediatric nurse -Drink alcoholic beverages -Take any medication unless instructed by your physician -Make any legal decisions or sign important papers.  Meals: Start with liquid foods such as gelatin or soup. Progress to regular foods as tolerated. Avoid greasy, spicy, heavy foods. If nausea and/or vomiting occur, drink only clear liquids until the nausea and/or vomiting subsides. Call your physician if vomiting continues.  Special Instructions/Symptoms: Your throat may feel dry or sore from the anesthesia or the breathing tube placed in your throat during surgery. If this causes discomfort, gargle with warm salt water. The discomfort should disappear within 24 hours.  If you had a scopolamine patch placed behind your ear for the management of post- operative nausea and/or vomiting:  1. The medication in the patch is effective  for 72 hours, after which it should be removed.  Wrap patch in a tissue and discard in the trash. Wash hands thoroughly with soap and water. 2. You may remove the patch earlier than 72 hours if you experience unpleasant side effects which may include dry mouth, dizziness or visual disturbances. 3. Avoid touching the patch. Wash your hands with soap and water after contact with the patch.

## 2019-08-06 NOTE — Anesthesia Preprocedure Evaluation (Addendum)
Anesthesia Evaluation  Patient identified by MRN, date of birth, ID band Patient awake    Reviewed: Allergy & Precautions, NPO status , Patient's Chart, lab work & pertinent test results  Airway Mallampati: I  TM Distance: >3 FB Neck ROM: Full    Dental  (+) Partial Upper, Dental Advisory Given, Missing,    Pulmonary asthma , COPD,  COPD inhaler, former smoker,    Pulmonary exam normal breath sounds clear to auscultation       Cardiovascular hypertension, Normal cardiovascular exam Rhythm:Regular Rate:Normal  HLD  TTE 2009 Normal EF, valves ok   Neuro/Psych  Headaches, PSYCHIATRIC DISORDERS Anxiety Depression    GI/Hepatic Neg liver ROS, GERD  Medicated,  Endo/Other  Hypothyroidism   Renal/GU negative Renal ROS  negative genitourinary   Musculoskeletal  (+) Arthritis ,   Abdominal   Peds  Hematology negative hematology ROS (+)   Anesthesia Other Findings   Reproductive/Obstetrics                            Anesthesia Physical Anesthesia Plan  ASA: III  Anesthesia Plan: General   Post-op Pain Management:    Induction: Intravenous  PONV Risk Score and Plan: 3 and Midazolam, Dexamethasone and Ondansetron  Airway Management Planned: Oral ETT  Additional Equipment:   Intra-op Plan:   Post-operative Plan: Extubation in OR  Informed Consent: I have reviewed the patients History and Physical, chart, labs and discussed the procedure including the risks, benefits and alternatives for the proposed anesthesia with the patient or authorized representative who has indicated his/her understanding and acceptance.     Dental advisory given  Plan Discussed with: CRNA  Anesthesia Plan Comments:         Anesthesia Quick Evaluation

## 2019-08-06 NOTE — Transfer of Care (Signed)
Immediate Anesthesia Transfer of Care Note  Patient: Katherine Hayden  Procedure(s) Performed: BILATERAL MAMMARY REDUCTION (BREAST) WITH LIPOSUCTION (Bilateral Breast)  Patient Location: PACU  Anesthesia Type:General  Level of Consciousness: drowsy and patient cooperative  Airway & Oxygen Therapy: Patient Spontanous Breathing and Patient connected to face mask oxygen  Post-op Assessment: Report given to RN and Post -op Vital signs reviewed and stable  Post vital signs: Reviewed and stable  Last Vitals:  Vitals Value Taken Time  BP 146/75 08/06/19 1200  Temp    Pulse 89 08/06/19 1204  Resp 17 08/06/19 1204  SpO2 95 % 08/06/19 1204  Vitals shown include unvalidated device data.  Last Pain:  Vitals:   08/06/19 0812  TempSrc: Oral  PainSc: 9          Complications: No apparent anesthesia complications

## 2019-08-06 NOTE — Anesthesia Procedure Notes (Signed)
Procedure Name: Intubation Date/Time: 08/06/2019 9:45 AM Performed by: Genelle Bal, CRNA Pre-anesthesia Checklist: Patient identified, Emergency Drugs available, Suction available and Patient being monitored Patient Re-evaluated:Patient Re-evaluated prior to induction Oxygen Delivery Method: Circle system utilized Preoxygenation: Pre-oxygenation with 100% oxygen Induction Type: IV induction Ventilation: Mask ventilation without difficulty and Oral airway inserted - appropriate to patient size Laryngoscope Size: Sabra Heck and 2 Grade View: Grade I Tube type: Oral Tube size: 7.0 mm Number of attempts: 1 Airway Equipment and Method: Stylet and Oral airway Placement Confirmation: ETT inserted through vocal cords under direct vision,  positive ETCO2 and breath sounds checked- equal and bilateral Secured at: 22 cm Tube secured with: Tape Dental Injury: Teeth and Oropharynx as per pre-operative assessment

## 2019-08-06 NOTE — Op Note (Signed)
Breast Reduction Op note:    DATE OF PROCEDURE: 08/06/2019  LOCATION: [place]  SURGEON: Lyndee Leo Sanger Marton Malizia, DO  ASSISTANT: Roetta Sessions, PA  PREOPERATIVE DIAGNOSIS 1. Macromastia 2. Neck Pain 3. Back Pain  POSTOPERATIVE DIAGNOSIS 1. Macromastia 2. Neck Pain 3. Back Pain  PROCEDURES 1. Bilateral breast reduction.  Right reduction 613 g, Left reduction 701 g (corrected)  COMPLICATIONS: None.  DRAINS: none  INDICATIONS FOR PROCEDURE Katherine Hayden is a 54 y.o. year-old female born on [birth year],with a history of symptomatic macromastia with concominant back pain, neck pain, shoulder grooving from her bra.   MRN: [id number]  CONSENT Informed consent was obtained directly from the patient. The risks, benefits and alternatives were fully discussed. Specific risks including but not limited to bleeding, infection, hematoma, seroma, scarring, pain, nipple necrosis, asymmetry, poor cosmetic results, and need for further surgery were discussed. The patient had ample opportunity to have her questions answered to her satisfaction.  DESCRIPTION OF PROCEDURE  Patient was brought into the operating room and placed in a supine position.  SCDs were placed and appropriate padding was performed.  Antibiotics were given. The patient underwent general anesthesia and the chest was prepped and draped in a sterile fashion.  A timeout was performed and all information was confirmed to be correct. Tumescent was placed in the lateral breast area on each side.  Right side: Preoperative markings were confirmed.  Incision lines were injected with 1% Xylocaine with epinephrine.  After waiting for vasoconstriction, the marked lines were incised.  A Wise-pattern superomedial breast reduction was performed by de-epithelializing the pedicle, using bovie to create the superomedial pedicle, and removing breast tissue from the superior, lateral, and inferior portions of the  breast.  Care was taken to not undermine the breast pedicle. Hemostasis was achieved.  The nipple was gently rotated into position and the soft tissue closed with 4-0 Monocryl.   Hemostasis was confirmed.  The deep tissues were approximated with 3-0 monocryl sutures and the skin was closed with deep dermal and subcuticular 4-0 Monocryl sutures.  Liposuction was done laterally.  The nipple and skin flaps had good capillary refill at the end of the procedure.    Left side: Preoperative markings were confirmed.  Incision lines were injected with 1% Xylocaine with epinephrine.  After waiting for vasoconstriction, the marked lines were incised.  A Wise-pattern superomedial breast reduction was performed by de-epithelializing the pedicle, using bovie to create the superomedial pedicle, and removing breast tissue from the superior, lateral, and inferior portions of the breast.  Care was taken to not undermine the breast pedicle. Hemostasis was achieved.  The nipple was gently rotated into position and the soft tissue was closed with 4-0 Monocryl.  The patient was sat upright and size and shape symmetry was confirmed. Hemostasis was confirmed.  Liposuction was done laterally. The deep tissues were approximated with 3-0 monocryl sutures and the skin was closed with deep dermal and subcuticular 4-0 Monocryl sutures.  Dermabond was applied.  A breast binder and ABDs were placed.  The nipple and skin flaps had good capillary refill at the end of the procedure.  The patient tolerated the procedure well. The patient was allowed to wake from anesthesia and taken to the recovery room in satisfactory condition  The advanced practice practitioner (APP) assisted throughout the case.  The APP was essential in retraction and counter traction when needed to make the case progress smoothly.  This retraction and assistance made it possible to see  the tissue plans for the procedure.  The assistance was needed for blood control, tissue  re-approximation and assisted with closure of the incision site.  The [place] was signed into law in 2016 which includes the topic of electronic health records.  This provides immediate access to information in MyChart.  This includes consultation notes, operative notes, office notes, lab results and pathology reports.  If you have any questions about what you read please let us know at your next visit or call us at the office.  We are right here with you.

## 2019-08-07 DIAGNOSIS — M549 Dorsalgia, unspecified: Secondary | ICD-10-CM | POA: Diagnosis not present

## 2019-08-07 DIAGNOSIS — G8929 Other chronic pain: Secondary | ICD-10-CM | POA: Diagnosis not present

## 2019-08-07 DIAGNOSIS — Z79899 Other long term (current) drug therapy: Secondary | ICD-10-CM | POA: Diagnosis not present

## 2019-08-07 DIAGNOSIS — M542 Cervicalgia: Secondary | ICD-10-CM | POA: Diagnosis not present

## 2019-08-07 DIAGNOSIS — Z885 Allergy status to narcotic agent status: Secondary | ICD-10-CM | POA: Diagnosis not present

## 2019-08-07 DIAGNOSIS — Z87891 Personal history of nicotine dependence: Secondary | ICD-10-CM | POA: Diagnosis not present

## 2019-08-07 DIAGNOSIS — Z882 Allergy status to sulfonamides status: Secondary | ICD-10-CM | POA: Diagnosis not present

## 2019-08-07 DIAGNOSIS — Z888 Allergy status to other drugs, medicaments and biological substances status: Secondary | ICD-10-CM | POA: Diagnosis not present

## 2019-08-07 DIAGNOSIS — Z7951 Long term (current) use of inhaled steroids: Secondary | ICD-10-CM | POA: Diagnosis not present

## 2019-08-07 DIAGNOSIS — Z880 Allergy status to penicillin: Secondary | ICD-10-CM | POA: Diagnosis not present

## 2019-08-07 DIAGNOSIS — Z887 Allergy status to serum and vaccine status: Secondary | ICD-10-CM | POA: Diagnosis not present

## 2019-08-07 DIAGNOSIS — I10 Essential (primary) hypertension: Secondary | ICD-10-CM | POA: Diagnosis not present

## 2019-08-07 DIAGNOSIS — E785 Hyperlipidemia, unspecified: Secondary | ICD-10-CM | POA: Diagnosis not present

## 2019-08-07 DIAGNOSIS — N62 Hypertrophy of breast: Secondary | ICD-10-CM | POA: Diagnosis not present

## 2019-08-07 DIAGNOSIS — K589 Irritable bowel syndrome without diarrhea: Secondary | ICD-10-CM | POA: Diagnosis not present

## 2019-08-07 DIAGNOSIS — G2581 Restless legs syndrome: Secondary | ICD-10-CM | POA: Diagnosis not present

## 2019-08-07 DIAGNOSIS — K219 Gastro-esophageal reflux disease without esophagitis: Secondary | ICD-10-CM | POA: Diagnosis not present

## 2019-08-07 DIAGNOSIS — E039 Hypothyroidism, unspecified: Secondary | ICD-10-CM | POA: Diagnosis not present

## 2019-08-07 DIAGNOSIS — Z791 Long term (current) use of non-steroidal anti-inflammatories (NSAID): Secondary | ICD-10-CM | POA: Diagnosis not present

## 2019-08-07 DIAGNOSIS — J439 Emphysema, unspecified: Secondary | ICD-10-CM | POA: Diagnosis not present

## 2019-08-07 LAB — SURGICAL PATHOLOGY

## 2019-08-07 NOTE — Discharge Summary (Signed)
Physician Discharge Summary  [name] MRN: [id number] DOB/AGE: 05/11/1965 54 y.o.  Admit date: 08/06/2019 Discharge date: 08/07/2019  Admission Diagnoses: Symptomatic mammary hypertrophy  Discharge Diagnoses:  Active Problems:   Symptomatic mammary hypertrophy   Discharged Condition: good  Hospital Course: Patient is a 54 year old female who presented to the [facility] on 08/06/2019 for bilateral breast reduction with liposuction with Dr. Marla Roe.  Patient stayed overnight for observation.  No acute overnight events.  Patient without any fevers or chills or nausea or vomiting.  I evaluated patient this a.m. in her room with nursing staff present.  She was feeling well other than some "shaking".  She has a history of smoking cigarettes, she has quit prior to the surgery and reports she is going to continue to abstain from smoking.  I encouraged her to do this and I also encouraged her to use her incentive spirometer while at home  Consults: None  Significant Diagnostic Studies: None  Treatments: analgesia: Vicodin, surgery: Bilateral breast reduction   Discharge Exam: Blood pressure (!) 116/59, pulse 87, temperature 98.1 F (36.7 C), resp. rate 20, height 5\' 6"  (1.676 m), weight 100.6 kg, last menstrual period 12/15/2013, SpO2 100 %. General appearance: alert, cooperative, no distress and Resting in bed Head: Normocephalic, without obvious abnormality, atraumatic Resp: Unlabored, symmetric rise and fall Chest wall: No abnormalities noted Breasts: Bilateral breasts with post breast reduction incisions noted.  Bilateral NAC's with good color, good cap refill.  Incisions are all C/D/I, no drainage noted.  Steri-Strips in place along the vertical limb incision.  No swelling noted.  No ecchymosis noted.  Bilateral breasts are soft, symmetric.  No erythema noted. Extremities: extremities normal, atraumatic, no cyanosis or edema and SCDs in  place Pulses: 2+ and symmetric   Disposition: Discharge disposition: 01-Home or Self Care       Discharge Instructions    Call MD for:  difficulty breathing, headache or visual disturbances   Complete by: As directed    Call MD for:  extreme fatigue   Complete by: As directed    Call MD for:  hives   Complete by: As directed    Call MD for:  persistant dizziness or light-headedness   Complete by: As directed    Call MD for:  persistant nausea and vomiting   Complete by: As directed    Call MD for:  redness, tenderness, or signs of infection (pain, swelling, redness, odor or green/yellow discharge around incision site)   Complete by: As directed    Call MD for:  severe uncontrolled pain   Complete by: As directed    Call MD for:  temperature >100.4   Complete by: As directed    Diet - low sodium heart healthy   Complete by: As directed    Increase activity slowly   Complete by: As directed      Allergies as of 08/07/2019      Reactions   Pneumococcal Vaccines Itching, Other (See Comments)   Patient received Influenza and Pneumococcal vaccines at the same time-caused severe itching and needs medication to reverse reaction, this caused her to go into cardiac arrest at that time.     Amoxicillin Hives   Did it involve swelling of the face/tongue/throat, SOB, or low BP? Yes Did it involve sudden or severe rash/hives, skin peeling, or any reaction on the inside of your mouth or nose? Yes Did you need to seek medical attention at a hospital or  doctor's office? Unknown When did it last happen? 6-7 years ago If all above answers are "NO", may proceed with cephalosporin use.   Codeine Nausea And Vomiting   Hydromorphone Hcl Nausea And Vomiting   Sulfonamide Derivatives Hives   Adhesive [tape]    Causes red rash/hives   Morphine And Related Hives      Medication List    TAKE these medications   ALPRAZolam 1 MG tablet Commonly known as: XANAX Take 1 mg by mouth 4  (four) times daily as needed for anxiety.   atorvastatin 20 MG tablet Commonly known as: LIPITOR Take 20 mg by mouth at bedtime.   baclofen 10 MG tablet Commonly known as: LIORESAL Take 10 mg by mouth in the morning and at bedtime.   ciprofloxacin 500 MG tablet Commonly known as: Cipro Take 1 tablet (500 mg total) by mouth 2 (two) times daily.   dicyclomine 20 MG tablet Commonly known as: BENTYL Take 20 mg by mouth 3 (three) times daily before meals.   escitalopram 20 MG tablet Commonly known as: LEXAPRO Take 20 mg by mouth daily.   esomeprazole 40 MG capsule Commonly known as: NEXIUM Take 40 mg by mouth in the morning and at bedtime.   Fluticasone-Salmeterol 250-50 MCG/DOSE Aepb Commonly known as: ADVAIR Inhale 1 puff into the lungs 2 (two) times daily.   HYDROcodone-acetaminophen 10-325 MG tablet Commonly known as: NORCO Take 1 tablet by mouth every 6 (six) hours as needed (pain.).   methocarbamol 500 MG tablet Commonly known as: ROBAXIN Take 500 mg by mouth 3 (three) times daily as needed for spasms.   montelukast 10 MG tablet Commonly known as: SINGULAIR Take 10 mg by mouth at bedtime.   ondansetron 4 MG tablet Commonly known as: Zofran Take 1 tablet (4 mg total) by mouth every 8 (eight) hours as needed for nausea or vomiting.   ondansetron 4 MG tablet Commonly known as: Zofran Take 1 tablet (4 mg total) by mouth every 8 (eight) hours as needed for nausea or vomiting.   ProAir HFA 108 (90 Base) MCG/ACT inhaler Generic drug: albuterol Inhale 2 puffs into the lungs every 4 (four) hours as needed for wheezing or shortness of breath.   QUEtiapine 300 MG tablet Commonly known as: SEROQUEL Take 300 mg by mouth at bedtime.   Restasis 0.05 % ophthalmic emulsion Generic drug: cycloSPORINE Place 1 drop into both eyes 2 (two) times daily.   rOPINIRole 4 MG tablet Commonly known as: REQUIP Take 4 mg by mouth at bedtime.   Synthroid 150 MCG tablet Generic  drug: levothyroxine Take 1 tablet (150 mcg total) by mouth daily before breakfast.   valACYclovir 1000 MG tablet Commonly known as: VALTREX Take 1,000 mg by mouth 2 (two) times daily as needed (cold sores.).      Follow-up Information    Dillingham, Loel Lofty, DO In 1 week.   Specialty: Plastic Surgery Contact information: [address] [place] [address] [facility] [address] [place], [place] [address] [phone]  Signed: Carola Rhine Jahvon Gosline 08/07/2019, 8:28 AM

## 2019-08-07 NOTE — Anesthesia Postprocedure Evaluation (Signed)
Anesthesia Post Note  Patient: Katherine Hayden  Procedure(s) Performed: BILATERAL MAMMARY REDUCTION (BREAST) WITH LIPOSUCTION (Bilateral Breast)     Patient location during evaluation: PACU Anesthesia Type: General Level of consciousness: awake and alert Pain management: pain level controlled Vital Signs Assessment: post-procedure vital signs reviewed and stable Respiratory status: spontaneous breathing, nonlabored ventilation, respiratory function stable and patient connected to nasal cannula oxygen Cardiovascular status: blood pressure returned to baseline and stable Postop Assessment: no apparent nausea or vomiting Anesthetic complications: no    Last Vitals:  Vitals:   08/07/19 0700 08/07/19 0730  BP:    Pulse:    Resp:    Temp:    SpO2: 98% 100%    Last Pain:  Vitals:   08/07/19 0730  TempSrc:   PainSc: Asleep                 Babita Amaker L Nayeli Calvert

## 2019-08-11 ENCOUNTER — Telehealth: Payer: Self-pay

## 2019-08-11 NOTE — Telephone Encounter (Signed)
Patient called to ask if she is able to apply ice packs to her chest as she is experiencing pain.

## 2019-08-11 NOTE — Telephone Encounter (Signed)
Spoke with patient, ice in axilla is fine. Advised to not place directly over breast or NAC. Alternate APAP and NSAIDs for pain, Norco PRN for severe pain. She is not having any f/c. She feels well other than some pain and being more tired than pre-op.

## 2019-08-12 ENCOUNTER — Encounter: Payer: Self-pay | Admitting: Surgical

## 2019-08-12 NOTE — Progress Notes (Signed)
No show

## 2019-08-14 ENCOUNTER — Ambulatory Visit (INDEPENDENT_AMBULATORY_CARE_PROVIDER_SITE_OTHER): Payer: Medicare Other | Admitting: Surgical

## 2019-08-14 ENCOUNTER — Encounter: Payer: Self-pay | Admitting: Surgical

## 2019-08-14 ENCOUNTER — Other Ambulatory Visit: Payer: Self-pay

## 2019-08-14 DIAGNOSIS — N62 Hypertrophy of breast: Secondary | ICD-10-CM

## 2019-08-14 DIAGNOSIS — Z9889 Other specified postprocedural states: Secondary | ICD-10-CM

## 2019-08-14 DIAGNOSIS — M542 Cervicalgia: Secondary | ICD-10-CM

## 2019-08-14 DIAGNOSIS — G8929 Other chronic pain: Secondary | ICD-10-CM

## 2019-08-14 DIAGNOSIS — M546 Pain in thoracic spine: Secondary | ICD-10-CM

## 2019-08-14 NOTE — Progress Notes (Signed)
Patient is a 54 year old female here for follow-up after bilateral breast reduction with liposuction on 08/06/2019 with Dr. Marla Roe.  Overall she is doing well today, she reports some pain in her axillary region, she has been taking Tylenol ibuprofen which has been helpful but states she is still having some pain.  No fever, chills, nausea, vomiting.  Feeling well other than breast pain.  Chaperone present On exam bilateral breasts are symmetric, soft, bilateral NAC with good color and cap refill.  Incisions are all C/D/I.  No erythema.  Some bruising noted in the axillary region bilaterally.  Some tenderness to palpation in the axillary region.  No drainage, purulence noted.  There is some swelling bilaterally with a small fluid wave noted.  Plan:  Ms. Gerilyn Nestle is doing well overall, her incisions are healing nicely she has good color of bilateral NAC's.  There is no sign of any infection or hematoma.  Possible small seroma bilaterally, both breasts are soft and the skin is not tight, therefore it was decided to forego aspiration today and continue with compression 24/7.  She can transition to a sports bra if she would like.  She continues to abstain from smoking, we are very proud of her as we know it is difficult, but is very important. Avoid heavy lifting.  Avoid over extensive exercises or range of motion. Call with any questions or concerns. Follow-up in 2 weeks for reevaluation, call if swelling worsens or she notices any changes and would like to be seen sooner.

## 2019-08-25 ENCOUNTER — Other Ambulatory Visit: Payer: Self-pay | Admitting: "Endocrinology

## 2019-08-28 ENCOUNTER — Encounter: Payer: Medicare Other | Admitting: Plastic Surgery

## 2019-09-01 ENCOUNTER — Other Ambulatory Visit: Payer: Self-pay

## 2019-09-01 ENCOUNTER — Ambulatory Visit (INDEPENDENT_AMBULATORY_CARE_PROVIDER_SITE_OTHER): Payer: Medicare Other | Admitting: Plastic Surgery

## 2019-09-01 ENCOUNTER — Encounter: Payer: Self-pay | Admitting: Plastic Surgery

## 2019-09-01 VITALS — BP 130/80 | HR 90 | Temp 98.4°F

## 2019-09-01 DIAGNOSIS — N62 Hypertrophy of breast: Secondary | ICD-10-CM

## 2019-09-01 NOTE — Progress Notes (Signed)
   Subjective:    [name], female    DOB: April 06, 1966, 54 y.o.   MRN: [id number]  The patient is a 54 year old female here for follow-up after undergoing a bilateral breast reduction 1 month ago.  The patient complains of pain in the lateral breast area that is sometimes sharp in nature.  It sounds and looks like her nerves are coming back.  There is no sign of infection.  All incisions are healing nicely.  I thought there might be some fluid collection but I did not get anything out when I tried to drain the right side.  I did not drain the left side.      Review of Systems  Constitutional: Negative.   Eyes: Negative.   Respiratory: Negative.   Cardiovascular: Negative.   Genitourinary: Negative.        Objective:   Physical Exam Vitals and nursing note reviewed.  Constitutional:      Appearance: Normal appearance.  HENT:     Head: Normocephalic and atraumatic.  Cardiovascular:     Rate and Rhythm: Normal rate.  Pulmonary:     Effort: Pulmonary effort is normal.  Neurological:     General: No focal deficit present.     Mental Status: She is alert. Mental status is at baseline.  Psychiatric:        Mood and Affect: Mood normal.        Behavior: Behavior normal.        Assessment & Plan:     ICD-10-CM   1. Symptomatic mammary hypertrophy  N62     I like to see the patient back in 1 to 2 weeks.  Continue with sports bra and light massage as able.  We will also plan to get postop pictures at her next visit.

## 2019-09-07 DIAGNOSIS — K219 Gastro-esophageal reflux disease without esophagitis: Secondary | ICD-10-CM | POA: Diagnosis not present

## 2019-09-07 DIAGNOSIS — E7849 Other hyperlipidemia: Secondary | ICD-10-CM | POA: Diagnosis not present

## 2019-09-07 DIAGNOSIS — I10 Essential (primary) hypertension: Secondary | ICD-10-CM | POA: Diagnosis not present

## 2019-09-07 DIAGNOSIS — E039 Hypothyroidism, unspecified: Secondary | ICD-10-CM | POA: Diagnosis not present

## 2019-09-08 ENCOUNTER — Other Ambulatory Visit: Payer: Self-pay

## 2019-09-08 ENCOUNTER — Ambulatory Visit (INDEPENDENT_AMBULATORY_CARE_PROVIDER_SITE_OTHER): Payer: Medicare Other | Admitting: Surgical

## 2019-09-08 ENCOUNTER — Encounter: Payer: Self-pay | Admitting: Surgical

## 2019-09-08 VITALS — BP 157/84 | HR 73 | Temp 97.1°F | Ht 66.0 in | Wt 223.4 lb

## 2019-09-08 DIAGNOSIS — Z9889 Other specified postprocedural states: Secondary | ICD-10-CM

## 2019-09-08 DIAGNOSIS — N62 Hypertrophy of breast: Secondary | ICD-10-CM

## 2019-09-08 DIAGNOSIS — G8929 Other chronic pain: Secondary | ICD-10-CM

## 2019-09-08 DIAGNOSIS — M546 Pain in thoracic spine: Secondary | ICD-10-CM

## 2019-09-08 NOTE — Progress Notes (Signed)
Patient is a 54 year old female here for follow-up after undergoing bilateral breast reduction with liposuction on 08/06/2019 with Dr. Marla Roe.  Patient reports that overall she is doing well, she does report she is continuing to have some left and right lateral breast pain.  She reports it feels like cold needles.  She is not having any fevers or chills, nausea or vomiting.  Chaperone present on exam On exam bilateral NAC's with good color, incisions are well-healed.  All incisions are C/D/I.  No breast erythema noted.  Bilateral breasts are soft, no fluid wave or fluid collections noted.  A little bit of fat necrosis noted along left lateral and right lateral breast, this is tender to palpation.  No overlying skin changes.  Recommend massaging left and right lateral breast daily to help assist with breaking out fat necrosis.  Discussed that abnormal sensation is likely related to nerves regrowing.  There is no sign of any infection, seroma, hematoma.  Patient's incisions are healing really well.  Recommend scar cream.  Recommend continue to wear sports bra 24/7 for 2 more weeks then she can transition to only wearing  Call with any questions or concerns.  Follow-up in 3 to 4 weeks. Pictures were obtained of the patient and placed in the chart with the patient's or guardian's permission.

## 2019-09-09 DIAGNOSIS — G259 Extrapyramidal and movement disorder, unspecified: Secondary | ICD-10-CM | POA: Diagnosis not present

## 2019-09-09 DIAGNOSIS — K2100 Gastro-esophageal reflux disease with esophagitis, without bleeding: Secondary | ICD-10-CM | POA: Diagnosis not present

## 2019-09-09 DIAGNOSIS — H8111 Benign paroxysmal vertigo, right ear: Secondary | ICD-10-CM | POA: Diagnosis not present

## 2019-09-09 DIAGNOSIS — R739 Hyperglycemia, unspecified: Secondary | ICD-10-CM | POA: Diagnosis not present

## 2019-09-09 DIAGNOSIS — I10 Essential (primary) hypertension: Secondary | ICD-10-CM | POA: Diagnosis not present

## 2019-09-09 DIAGNOSIS — R1084 Generalized abdominal pain: Secondary | ICD-10-CM | POA: Diagnosis not present

## 2019-09-09 DIAGNOSIS — Z0000 Encounter for general adult medical examination without abnormal findings: Secondary | ICD-10-CM | POA: Diagnosis not present

## 2019-09-09 DIAGNOSIS — E039 Hypothyroidism, unspecified: Secondary | ICD-10-CM | POA: Diagnosis not present

## 2019-09-15 ENCOUNTER — Telehealth: Payer: Self-pay | Admitting: *Deleted

## 2019-09-15 NOTE — Telephone Encounter (Signed)
Received request for additional information from [facility] via of fax.  Needing diagnosis code for blood work drawn (07/17/19).  Form completed and faxed to [facility].   Confirmation received, and copy scanned into the chart.//AB/CMA

## 2019-10-05 ENCOUNTER — Encounter: Payer: Self-pay | Admitting: Surgical

## 2019-10-05 ENCOUNTER — Other Ambulatory Visit: Payer: Self-pay

## 2019-10-05 ENCOUNTER — Ambulatory Visit (INDEPENDENT_AMBULATORY_CARE_PROVIDER_SITE_OTHER): Payer: Medicare Other | Admitting: Surgical

## 2019-10-05 VITALS — BP 137/92 | HR 74 | Temp 96.6°F

## 2019-10-05 DIAGNOSIS — Z9889 Other specified postprocedural states: Secondary | ICD-10-CM

## 2019-10-05 NOTE — Progress Notes (Signed)
   Subjective:     [name], female    DOB: Sep 04, 1965, 54 y.o.   MRN: [id number]  Chief Complaint  Patient presents with  . Follow-up    HPI: The patient is a 54 y.o. female here for follow-up after bilateral breast reduction on 08/06/2019 with Dr. Marla Roe.  She is doing well today.  She reports continuing to have intermittent tenderness of bilateral lateral breasts near her previous JP drain insertion sites.  No other complaints. She is otherwise doing well.   Review of Systems  Constitutional: Negative.   Respiratory: Negative.   Skin: Negative for color change and wound.     Objective:   Vital Signs BP (!) 137/92 (BP Location: Left Arm, Patient Position: Sitting, Cuff Size: Large)   Pulse 74   Temp (!) 96.6 F (35.9 C) (Temporal)   LMP 12/15/2013 (Approximate)   SpO2 97%  Vital Signs and Nursing Note Reviewed Chaperone present Physical Exam Constitutional:      Appearance: Normal appearance.  Pulmonary:     Effort: Pulmonary effort is normal.  Chest:    Skin:    General: Skin is warm and dry.  Neurological:     General: No focal deficit present.     Mental Status: She is alert. Mental status is at baseline.  Psychiatric:        Mood and Affect: Mood normal.        Behavior: Behavior normal.     Assessment/Plan:     ICD-10-CM   1. S/P bilateral breast reduction  Z98.890     Patient is doing well, no signs of infection, seroma, hematoma.  No wounds noted.  Incisions are healing well.  Patient is pleased with the way her incisions have healed and may begin using scar cream.  Recommend continuing to wear sports bra.  Avoid normal bra until 6 months postop.  Call with any questions or concerns.  Follow-up in 6 months for reevaluation.  Pictures were obtained of the patient and placed in the chart with the patient's or guardian's permission.   Carola Rhine Shilo Pauwels, PA-C 10/05/2019, 1:31 PM

## 2019-10-07 DIAGNOSIS — E039 Hypothyroidism, unspecified: Secondary | ICD-10-CM | POA: Diagnosis not present

## 2019-10-07 DIAGNOSIS — K219 Gastro-esophageal reflux disease without esophagitis: Secondary | ICD-10-CM | POA: Diagnosis not present

## 2019-10-07 DIAGNOSIS — I10 Essential (primary) hypertension: Secondary | ICD-10-CM | POA: Diagnosis not present

## 2019-10-07 DIAGNOSIS — E7849 Other hyperlipidemia: Secondary | ICD-10-CM | POA: Diagnosis not present

## 2019-10-21 DIAGNOSIS — J40 Bronchitis, not specified as acute or chronic: Secondary | ICD-10-CM | POA: Diagnosis not present

## 2019-10-21 DIAGNOSIS — R531 Weakness: Secondary | ICD-10-CM | POA: Diagnosis not present

## 2019-10-21 DIAGNOSIS — G8929 Other chronic pain: Secondary | ICD-10-CM | POA: Diagnosis not present

## 2019-10-21 DIAGNOSIS — J209 Acute bronchitis, unspecified: Secondary | ICD-10-CM | POA: Diagnosis not present

## 2019-10-21 DIAGNOSIS — R197 Diarrhea, unspecified: Secondary | ICD-10-CM | POA: Diagnosis not present

## 2019-10-21 DIAGNOSIS — Z743 Need for continuous supervision: Secondary | ICD-10-CM | POA: Diagnosis not present

## 2019-10-21 DIAGNOSIS — E039 Hypothyroidism, unspecified: Secondary | ICD-10-CM | POA: Diagnosis not present

## 2019-10-21 DIAGNOSIS — G44219 Episodic tension-type headache, not intractable: Secondary | ICD-10-CM | POA: Diagnosis not present

## 2019-10-21 DIAGNOSIS — M549 Dorsalgia, unspecified: Secondary | ICD-10-CM | POA: Diagnosis not present

## 2019-10-21 DIAGNOSIS — R0902 Hypoxemia: Secondary | ICD-10-CM | POA: Diagnosis not present

## 2019-10-21 DIAGNOSIS — Z20822 Contact with and (suspected) exposure to covid-19: Secondary | ICD-10-CM | POA: Diagnosis not present

## 2019-10-21 DIAGNOSIS — Z87891 Personal history of nicotine dependence: Secondary | ICD-10-CM | POA: Diagnosis not present

## 2019-10-21 DIAGNOSIS — R05 Cough: Secondary | ICD-10-CM | POA: Diagnosis not present

## 2019-10-21 DIAGNOSIS — R7303 Prediabetes: Secondary | ICD-10-CM | POA: Diagnosis not present

## 2019-10-21 DIAGNOSIS — R0602 Shortness of breath: Secondary | ICD-10-CM | POA: Diagnosis not present

## 2019-10-21 DIAGNOSIS — K219 Gastro-esophageal reflux disease without esophagitis: Secondary | ICD-10-CM | POA: Diagnosis not present

## 2019-10-21 DIAGNOSIS — R110 Nausea: Secondary | ICD-10-CM | POA: Diagnosis not present

## 2019-10-21 DIAGNOSIS — I10 Essential (primary) hypertension: Secondary | ICD-10-CM | POA: Diagnosis not present

## 2019-10-21 DIAGNOSIS — R112 Nausea with vomiting, unspecified: Secondary | ICD-10-CM | POA: Diagnosis not present

## 2019-10-21 DIAGNOSIS — R52 Pain, unspecified: Secondary | ICD-10-CM | POA: Diagnosis not present

## 2019-10-21 DIAGNOSIS — Z9049 Acquired absence of other specified parts of digestive tract: Secondary | ICD-10-CM | POA: Diagnosis not present

## 2019-12-08 DIAGNOSIS — E039 Hypothyroidism, unspecified: Secondary | ICD-10-CM | POA: Diagnosis not present

## 2019-12-08 DIAGNOSIS — K219 Gastro-esophageal reflux disease without esophagitis: Secondary | ICD-10-CM | POA: Diagnosis not present

## 2019-12-08 DIAGNOSIS — I10 Essential (primary) hypertension: Secondary | ICD-10-CM | POA: Diagnosis not present

## 2019-12-08 DIAGNOSIS — E7849 Other hyperlipidemia: Secondary | ICD-10-CM | POA: Diagnosis not present

## 2019-12-16 DIAGNOSIS — H8111 Benign paroxysmal vertigo, right ear: Secondary | ICD-10-CM | POA: Diagnosis not present

## 2019-12-16 DIAGNOSIS — R05 Cough: Secondary | ICD-10-CM | POA: Diagnosis not present

## 2019-12-16 DIAGNOSIS — E039 Hypothyroidism, unspecified: Secondary | ICD-10-CM | POA: Diagnosis not present

## 2019-12-16 DIAGNOSIS — R739 Hyperglycemia, unspecified: Secondary | ICD-10-CM | POA: Diagnosis not present

## 2019-12-16 DIAGNOSIS — K219 Gastro-esophageal reflux disease without esophagitis: Secondary | ICD-10-CM | POA: Diagnosis not present

## 2019-12-16 DIAGNOSIS — I10 Essential (primary) hypertension: Secondary | ICD-10-CM | POA: Diagnosis not present

## 2019-12-16 DIAGNOSIS — G259 Extrapyramidal and movement disorder, unspecified: Secondary | ICD-10-CM | POA: Diagnosis not present

## 2019-12-16 LAB — TSH: TSH: 5.45 (ref 0.41–5.90)

## 2019-12-21 DIAGNOSIS — I10 Essential (primary) hypertension: Secondary | ICD-10-CM | POA: Diagnosis not present

## 2020-01-03 DIAGNOSIS — E039 Hypothyroidism, unspecified: Secondary | ICD-10-CM | POA: Diagnosis not present

## 2020-01-03 DIAGNOSIS — M549 Dorsalgia, unspecified: Secondary | ICD-10-CM | POA: Diagnosis not present

## 2020-01-03 DIAGNOSIS — Z9049 Acquired absence of other specified parts of digestive tract: Secondary | ICD-10-CM | POA: Diagnosis not present

## 2020-01-03 DIAGNOSIS — R519 Headache, unspecified: Secondary | ICD-10-CM | POA: Diagnosis not present

## 2020-01-03 DIAGNOSIS — Z87891 Personal history of nicotine dependence: Secondary | ICD-10-CM | POA: Diagnosis not present

## 2020-01-03 DIAGNOSIS — K769 Liver disease, unspecified: Secondary | ICD-10-CM | POA: Diagnosis not present

## 2020-01-03 DIAGNOSIS — E079 Disorder of thyroid, unspecified: Secondary | ICD-10-CM | POA: Diagnosis not present

## 2020-01-03 DIAGNOSIS — G8929 Other chronic pain: Secondary | ICD-10-CM | POA: Diagnosis not present

## 2020-01-03 DIAGNOSIS — K219 Gastro-esophageal reflux disease without esophagitis: Secondary | ICD-10-CM | POA: Diagnosis not present

## 2020-01-03 DIAGNOSIS — I10 Essential (primary) hypertension: Secondary | ICD-10-CM | POA: Diagnosis not present

## 2020-01-04 DIAGNOSIS — I10 Essential (primary) hypertension: Secondary | ICD-10-CM | POA: Diagnosis not present

## 2020-01-04 DIAGNOSIS — R519 Headache, unspecified: Secondary | ICD-10-CM | POA: Diagnosis not present

## 2020-01-07 DIAGNOSIS — E039 Hypothyroidism, unspecified: Secondary | ICD-10-CM | POA: Diagnosis not present

## 2020-01-07 DIAGNOSIS — E7849 Other hyperlipidemia: Secondary | ICD-10-CM | POA: Diagnosis not present

## 2020-01-07 DIAGNOSIS — I10 Essential (primary) hypertension: Secondary | ICD-10-CM | POA: Diagnosis not present

## 2020-01-08 ENCOUNTER — Ambulatory Visit: Payer: Medicare Other | Admitting: Nurse Practitioner

## 2020-01-10 ENCOUNTER — Other Ambulatory Visit: Payer: Self-pay | Admitting: "Endocrinology

## 2020-01-19 ENCOUNTER — Other Ambulatory Visit: Payer: Self-pay

## 2020-01-19 ENCOUNTER — Ambulatory Visit (INDEPENDENT_AMBULATORY_CARE_PROVIDER_SITE_OTHER): Payer: Medicare Other | Admitting: Surgical

## 2020-01-19 ENCOUNTER — Encounter: Payer: Self-pay | Admitting: Surgical

## 2020-01-19 DIAGNOSIS — N6320 Unspecified lump in the left breast, unspecified quadrant: Secondary | ICD-10-CM

## 2020-01-19 DIAGNOSIS — Z9889 Other specified postprocedural states: Secondary | ICD-10-CM

## 2020-01-19 NOTE — Progress Notes (Signed)
   Subjective:     [name], female    DOB: 22-Aug-1965, 54 y.o.   MRN: [id number]  Chief Complaint  Patient presents with  . Follow-up    HPI: The patient is a 54 y.o. female here for follow-up after bilateral breast reduction on 08/06/2019 with Dr. Marla Roe.  She reports that she noticed a small lump in her left breast near the left NAC and was concerned and wanted this to be evaluated.  She is otherwise doing well in regards to her breast reduction.  Her last mammogram was on 06/17/2019 and showed no evidence of malignancy.  Surgical pathology from breast reduction on 08/06/2019 showed no histopathological changes and negative for carcinoma bilaterally.  She reports she has noticed a little bit of redness overlying the area of concern.  Review of Systems  Constitutional: Negative.   Skin: Negative for color change and wound.     Objective:   Vital Signs BP 133/83 (BP Location: Left Arm, Patient Position: Sitting, Cuff Size: Large)   Pulse 83   Temp (!) 96.9 F (36.1 C) (Temporal)   Ht 5\' 6"  (1.676 m)   Wt 230 lb (104.3 kg)   LMP 12/15/2013 (Approximate)   SpO2 99%   BMI 37.12 kg/m  Vital Signs and Nursing Note Reviewed Chaperone present tPhysical Exam Constitutional:      General: She is not in acute distress.    Appearance: Normal appearance. She is not ill-appearing.  HENT:     Head: Normocephalic and atraumatic.  Chest:     Breasts:        Left: Mass present. No swelling, bleeding, inverted nipple, nipple discharge or tenderness.       Comments: Area of concern on left breast does not feel consistent with scarring or fat necrosis. Neurological:     Mental Status: She is alert.     Assessment/Plan:     ICD-10-CM   1. S/P bilateral breast reduction  Z98.890    Discussed with patient we can order an ultrasound to evaluate, she is due for a routine mammogram in March 2022 - I encouraged her to schedule this.  Her last mammogram was on  06/17/2019 and was negative for any malignancy.  I do not notice any overlying skin changes of concern, no nipple discharge noted, there is some tenderness with palpation.  Does not feel consistent with scar tissue or fat necrosis on palpation.   Recommend calling with questions or concerns. Follow up scheduled for 03/28/2020.  We will call patient with ultrasound results.  She lives in [place] and would like exam to be performed in that area.   Carola Rhine Unknown Flannigan, PA-C 01/19/2020, 11:02 AM

## 2020-01-21 ENCOUNTER — Other Ambulatory Visit: Payer: Self-pay | Admitting: Surgical

## 2020-01-22 DIAGNOSIS — M5137 Other intervertebral disc degeneration, lumbosacral region: Secondary | ICD-10-CM | POA: Diagnosis not present

## 2020-02-06 DIAGNOSIS — E7849 Other hyperlipidemia: Secondary | ICD-10-CM | POA: Diagnosis not present

## 2020-02-06 DIAGNOSIS — I10 Essential (primary) hypertension: Secondary | ICD-10-CM | POA: Diagnosis not present

## 2020-02-06 DIAGNOSIS — E039 Hypothyroidism, unspecified: Secondary | ICD-10-CM | POA: Diagnosis not present

## 2020-02-17 DIAGNOSIS — N6325 Unspecified lump in the left breast, overlapping quadrants: Secondary | ICD-10-CM | POA: Diagnosis not present

## 2020-03-08 DIAGNOSIS — E039 Hypothyroidism, unspecified: Secondary | ICD-10-CM | POA: Diagnosis not present

## 2020-03-08 DIAGNOSIS — E7849 Other hyperlipidemia: Secondary | ICD-10-CM | POA: Diagnosis not present

## 2020-03-08 DIAGNOSIS — I10 Essential (primary) hypertension: Secondary | ICD-10-CM | POA: Diagnosis not present

## 2020-03-16 DIAGNOSIS — K219 Gastro-esophageal reflux disease without esophagitis: Secondary | ICD-10-CM | POA: Diagnosis not present

## 2020-03-16 DIAGNOSIS — E782 Mixed hyperlipidemia: Secondary | ICD-10-CM | POA: Diagnosis not present

## 2020-03-16 DIAGNOSIS — R519 Headache, unspecified: Secondary | ICD-10-CM | POA: Diagnosis not present

## 2020-03-16 DIAGNOSIS — I10 Essential (primary) hypertension: Secondary | ICD-10-CM | POA: Diagnosis not present

## 2020-03-16 DIAGNOSIS — R42 Dizziness and giddiness: Secondary | ICD-10-CM | POA: Diagnosis not present

## 2020-04-05 ENCOUNTER — Ambulatory Visit: Payer: Medicare Other | Admitting: Plastic Surgery

## 2020-04-08 DIAGNOSIS — K219 Gastro-esophageal reflux disease without esophagitis: Secondary | ICD-10-CM | POA: Diagnosis not present

## 2020-04-08 DIAGNOSIS — E7849 Other hyperlipidemia: Secondary | ICD-10-CM | POA: Diagnosis not present

## 2020-04-08 DIAGNOSIS — I10 Essential (primary) hypertension: Secondary | ICD-10-CM | POA: Diagnosis not present

## 2020-04-08 DIAGNOSIS — E039 Hypothyroidism, unspecified: Secondary | ICD-10-CM | POA: Diagnosis not present

## 2020-04-11 DIAGNOSIS — R069 Unspecified abnormalities of breathing: Secondary | ICD-10-CM | POA: Diagnosis not present

## 2020-04-12 DIAGNOSIS — R059 Cough, unspecified: Secondary | ICD-10-CM | POA: Diagnosis not present

## 2020-04-12 DIAGNOSIS — R509 Fever, unspecified: Secondary | ICD-10-CM | POA: Diagnosis not present

## 2020-04-14 DIAGNOSIS — U071 COVID-19: Secondary | ICD-10-CM | POA: Diagnosis not present

## 2020-04-14 DIAGNOSIS — Z23 Encounter for immunization: Secondary | ICD-10-CM | POA: Diagnosis not present

## 2020-05-07 DIAGNOSIS — E039 Hypothyroidism, unspecified: Secondary | ICD-10-CM | POA: Diagnosis not present

## 2020-05-07 DIAGNOSIS — I10 Essential (primary) hypertension: Secondary | ICD-10-CM | POA: Diagnosis not present

## 2020-05-07 DIAGNOSIS — E7849 Other hyperlipidemia: Secondary | ICD-10-CM | POA: Diagnosis not present

## 2020-05-07 DIAGNOSIS — K219 Gastro-esophageal reflux disease without esophagitis: Secondary | ICD-10-CM | POA: Diagnosis not present

## 2020-11-04 ENCOUNTER — Other Ambulatory Visit: Payer: Self-pay | Admitting: Orthopedic Surgery

## 2020-11-04 DIAGNOSIS — M542 Cervicalgia: Secondary | ICD-10-CM

## 2020-12-15 ENCOUNTER — Encounter (INDEPENDENT_AMBULATORY_CARE_PROVIDER_SITE_OTHER): Payer: Self-pay | Admitting: *Deleted

## 2021-01-17 ENCOUNTER — Other Ambulatory Visit: Payer: Self-pay | Admitting: Surgical

## 2021-01-17 DIAGNOSIS — N6320 Unspecified lump in the left breast, unspecified quadrant: Secondary | ICD-10-CM

## 2021-04-25 ENCOUNTER — Ambulatory Visit (INDEPENDENT_AMBULATORY_CARE_PROVIDER_SITE_OTHER): Payer: Medicare Other | Admitting: Internal Medicine

## 2021-04-25 ENCOUNTER — Encounter (INDEPENDENT_AMBULATORY_CARE_PROVIDER_SITE_OTHER): Payer: Self-pay | Admitting: Internal Medicine

## 2021-04-25 ENCOUNTER — Other Ambulatory Visit: Payer: Self-pay

## 2021-04-25 VITALS — BP 131/84 | HR 87 | Temp 98.1°F | Ht 66.0 in | Wt 227.1 lb

## 2021-04-25 DIAGNOSIS — R748 Abnormal levels of other serum enzymes: Secondary | ICD-10-CM | POA: Diagnosis not present

## 2021-04-25 DIAGNOSIS — R197 Diarrhea, unspecified: Secondary | ICD-10-CM | POA: Diagnosis not present

## 2021-04-25 DIAGNOSIS — K219 Gastro-esophageal reflux disease without esophagitis: Secondary | ICD-10-CM | POA: Diagnosis not present

## 2021-04-25 MED ORDER — DICYCLOMINE HCL 10 MG PO CAPS: 10.0000 mg | ORAL_CAPSULE | Freq: Three times a day (TID) | ORAL | 2 refills | Status: DC

## 2021-04-25 MED ORDER — ESOMEPRAZOLE MAGNESIUM 40 MG PO CPDR: 40.0000 mg | DELAYED_RELEASE_CAPSULE | Freq: Every day | ORAL | 3 refills | Status: AC

## 2021-04-25 MED ORDER — FAMOTIDINE 20 MG PO TABS: 20.0000 mg | ORAL_TABLET | Freq: Every evening | ORAL | Status: DC | PRN

## 2021-04-25 MED ORDER — LOPERAMIDE HCL 2 MG PO CAPS
2.0000 mg | ORAL_CAPSULE | Freq: Every day | ORAL | Status: DC
Start: 2021-04-25 — End: 2021-10-19

## 2021-04-25 NOTE — Patient Instructions (Addendum)
Please notify of if reflux medication does not work. Physician will call with results of blood test and stool test and further recommendations.

## 2021-04-25 NOTE — Progress Notes (Signed)
Presenting complaint;  Diarrhea.  Database and subjective:  Patient is 56 year old Caucasian female who is here for evaluation of diarrhea.  She also has chronic GERD maintained on double dose PPI. Patient underwent screening colonoscopy in March 2021.  At that time she was complained of intermittent diarrhea.  He had 4 small tubular adenomas removed.  Random biopsies from proximal and distal colon were negative for microscopic or collagenous colitis.  She was treated with dicyclomine and seemed to do better.  She says she has had diarrhea for 4 months.  She has anywhere from 1-5 stools every day.  All her stools are watery.  At times stool color is orange or yellow.  She has taken Pepto-Bismol which has not helped but makes her stools black.  She says her baseline stool frequency has been every other day.  She is taking Bentyl no more than once a day. She had TSH level checked 6 months ago.  She also complains of nausea.  She takes Zofran 3-4 times a week. She says her appetite is not good.  She eats 1 meal a day.  She also has water and 1 Pepsi per day.  She only has lost 3 pounds since her visit of January 2021. She feels esomeprazole is working well.  She rarely experiences heartburn with certain foods.  She denies dysphagia.  She has chronic lower back pain.  She has had 4 back surgeries.  Back issues started following an injury.  She takes pain medication 3-4 times daily.  She takes alprazolam twice a day. She says she did take antibiotics for bronchitis but she believes she took it after onset of diarrhea.  She quit cigarette smoking in October 2022 and drinks alcohol occasionally.  Current Medications: Outpatient Encounter Medications as of 04/25/2021  Medication Sig   ALPRAZolam (XANAX) 1 MG tablet Take 1 mg by mouth 4 (four) times daily as needed for anxiety.    atorvastatin (LIPITOR) 20 MG tablet Take 20 mg by mouth at bedtime.   baclofen (LIORESAL) 10 MG tablet Take 10 mg by mouth in  the morning and at bedtime.   dicyclomine (BENTYL) 20 MG tablet Take 20 mg by mouth 3 (three) times daily before meals.    escitalopram (LEXAPRO) 20 MG tablet Take 20 mg by mouth daily.   esomeprazole (NEXIUM) 40 MG capsule Take 40 mg by mouth in the morning and at bedtime.   gabapentin (NEURONTIN) 300 MG capsule Take 300 mg by mouth 3 (three) times daily.   HYDROcodone-acetaminophen (NORCO) 10-325 MG tablet Take 1 tablet by mouth every 6 (six) hours as needed (pain.).    losartan (COZAAR) 50 MG tablet Take 50 mg by mouth daily.   meclizine (ANTIVERT) 25 MG tablet Take 25 mg by mouth 3 (three) times daily as needed for dizziness.   montelukast (SINGULAIR) 10 MG tablet Take 10 mg by mouth at bedtime.   ondansetron (ZOFRAN) 4 MG tablet Take 1 tablet (4 mg total) by mouth every 8 (eight) hours as needed for nausea or vomiting.   PROAIR HFA 108 (90 BASE) MCG/ACT inhaler Inhale 2 puffs into the lungs every 4 (four) hours as needed for wheezing or shortness of breath.    propranolol (INDERAL) 80 MG tablet Take 80 mg by mouth daily.   RESTASIS 0.05 % ophthalmic emulsion Place 1 drop into both eyes 2 (two) times daily.   rizatriptan (MAXALT) 10 MG tablet Take 10 mg by mouth as needed for migraine. May repeat in 2 hours  if needed   rOPINIRole (REQUIP) 4 MG tablet Take 4 mg by mouth at bedtime.   SYNTHROID 150 MCG tablet Take 1 tablet (150 mcg total) by mouth daily before breakfast. (Patient taking differently: Take 200 mcg by mouth daily before breakfast.)   traZODone (DESYREL) 100 MG tablet Take 100 mg by mouth at bedtime.   valACYclovir (VALTREX) 1000 MG tablet Take 1,000 mg by mouth 2 (two) times daily as needed (cold sores.).   [DISCONTINUED] Fluticasone-Salmeterol (ADVAIR) 250-50 MCG/DOSE AEPB Inhale 1 puff into the lungs 2 (two) times daily.   [DISCONTINUED] methocarbamol (ROBAXIN) 500 MG tablet Take 500 mg by mouth 3 (three) times daily as needed for spasms.   [DISCONTINUED] QUEtiapine (SEROQUEL)  300 MG tablet Take 300 mg by mouth at bedtime.   No facility-administered encounter medications on file as of 04/25/2021.   Past Medical History:  Diagnosis Date   Anxiety    Arthritis    Asthma    Back pain    Cervicogenic headache 08/20/2016   Chronic headaches    Chronic pain    Depression    Emphysema    Emphysema of lung (HCC)    Fatty liver    GERD (gastroesophageal reflux disease)    High cholesterol    Hypertension    Hypothyroidism    IBS (irritable bowel syndrome)    Joint pain    RLS (restless legs syndrome)    Thyroid disease    Past Surgical History:  Procedure Laterality Date   ANTERIOR CERVICAL DECOMP/DISCECTOMY FUSION N/A 12/17/2018   Procedure: ANTERIOR CERVICAL DECOMPRESSION/DISCECTOMY FUSION CERVIAL FIVE THROUGH SIX;  Surgeon: Melina Schools, MD;  Location: [facility] [facility];  Service: Orthopedics;  Laterality: N/A;  2.5 hrs   APPENDECTOMY     BACK SURGERY     x4   BIOPSY  06/12/2019   Procedure: BIOPSY;  Surgeon: Rogene Houston, MD;  [facility];  Service: Endoscopy;;  random, sigmoid   BREAST REDUCTION SURGERY Bilateral 08/06/2019   Procedure: BILATERAL MAMMARY REDUCTION (BREAST) WITH LIPOSUCTION;  Surgeon: Wallace Going, DO;  Location: [place];  Service: Plastics;  Laterality: Bilateral;  3 hours   CHOLECYSTECTOMY  2010   COLONOSCOPY W/ POLYPECTOMY  07/2013   1 polyp removal   COLONOSCOPY WITH PROPOFOL N/A 06/12/2019   Procedure: COLONOSCOPY WITH PROPOFOL;  Surgeon: Rogene Houston, MD;  [facility];  Service: Endoscopy;  Laterality: N/A;  825   CYST REMOVAL HAND  2015   EXCISION MASS UPPER EXTREMETIES Left 02/25/2017   Procedure: EXCISION MASS UPPER EXTREMETIES, LEFT PALM;  Surgeon: Leanora Cover, MD;  Location: [place];  Service: Orthopedics;  Laterality: Left;   POLYPECTOMY  06/12/2019   Procedure: POLYPECTOMY;  Surgeon: Rogene Houston, MD;  [facility];  Service: Endoscopy;;   ascending;hepatic flexurex2;splenic flexure     Objective: Blood pressure 131/84, pulse 87, temperature 98.1 F (36.7 C), temperature source Oral, height 5' 6"  (1.676 m), weight 227 lb 1.6 oz (103 kg), last menstrual period 12/15/2013. Patient is alert and in no acute distress. Conjunctiva is pink. Sclera is nonicteric Oropharyngeal mucosa is normal. No neck masses or thyromegaly noted. Cardiac exam with regular rhythm normal S1 and S2. No murmur or gallop noted. Lungs are clear to auscultation. Abdomen; is full.  Bowel sounds are normal.  On palpation abdomen is soft with mild tenderness which is more or less generalized.  No guarding.  No organomegaly or masses. No LE edema  or clubbing noted.  Labs/studies Results:   CBC Latest Ref Rng & Units 02/19/2019 12/16/2018 04/14/2017  WBC - 11.7 9.9 10.9(H)  Hemoglobin 12.0 - 16.0 13.8 13.1 13.3  Hematocrit 36 - 46 42 41.5 42.2  Platelets 150 - 399 266 235 286    CMP Latest Ref Rng & Units 06/08/2019 05/07/2019 02/19/2019  Glucose 65 - 99 mg/dL 94 - -  BUN 6 - 24 mg/dL 12 - 10  Creatinine 0.57 - 1.00 mg/dL 0.73 - 0.8  Sodium 134 - 144 mmol/L 143 - 146  Potassium 3.5 - 5.2 mmol/L 4.1 - 4.6  Chloride 96 - 106 mmol/L 106 - 104  CO2 20 - 29 mmol/L 23 - 29(A)  Calcium 8.7 - 10.2 mg/dL 9.0 - 9.7  Total Protein 6.0 - 8.5 g/dL 6.4 6.5 -  Total Bilirubin 0.0 - 1.2 mg/dL 0.3 0.4 -  Alkaline Phos 39 - 117 IU/L 185(H) - -  AST 0 - 40 IU/L 23 26 42(A)  ALT 0 - 32 IU/L 32 40(H) 48(A)    Hepatic Function Latest Ref Rng & Units 06/08/2019 05/07/2019 02/19/2019  Total Protein 6.0 - 8.5 g/dL 6.4 6.5 -  Albumin 3.8 - 4.9 g/dL 4.0 - 4.3  AST 0 - 40 IU/L 23 26 42(A)  ALT 0 - 32 IU/L 32 40(H) 48(A)  Alk Phosphatase 39 - 117 IU/L 185(H) - -  Total Bilirubin 0.0 - 1.2 mg/dL 0.3 0.4 -  Bilirubin, Direct 0.0 - 0.2 mg/dL - 0.1 -    Celiac disease panel in January 2021 negative.  Serum IgA was 485.  Assessment:  #1.  Diarrhea.  She has been struggling with  diarrhea for 4 months.  She has had diarrhea prior to that but was intermittent.  Colonic biopsy at the time of screening colonoscopy in March 2021 did not reveal any evidence of endoscopic colitis and random biopsies are negative for microscopic colitis.  She also had negative celiac antibody panel.  She does give history of antibiotic use few months ago.  It is possible that we are dealing with IBS not responding to low-dose antispasmodic.  However need to rule out infection as well as C. difficile colitis. It is intriguing to note that she is not constipated with daily narcotic therapy.  #2.  Chronic GERD.  She is doing well with PPI twice daily.  Dose reduction would be attempted.  #3.  History of colonic adenomas.  Screening colonoscopy in March 2021 revealed 4 small tubular adenomas.  Next colonoscopy would be due in March 2026.   Plan:  Decrease esomeprazole to 40 mg by mouth 30 minutes before breakfast. Take famotidine 20 mg p.o. nightly. GI pathogen panel. Stool C. difficile antigen and toxin. CBC with differential comprehensive chemistry panel and TSH level. Loperamide 2 mg p.o. every morning. Office visit in 3 months. Take dicyclomine 10 mg by mouth 30 minutes before each meal.

## 2021-04-26 LAB — COMPREHENSIVE METABOLIC PANEL
AG Ratio: 1.6 (calc) (ref 1.0–2.5)
ALT: 15 U/L (ref 6–29)
AST: 16 U/L (ref 10–35)
Albumin: 4.1 g/dL (ref 3.6–5.1)
Alkaline phosphatase (APISO): 113 U/L (ref 37–153)
BUN: 11 mg/dL (ref 7–25)
CO2: 34 mmol/L — ABNORMAL HIGH (ref 20–32)
Calcium: 9.4 mg/dL (ref 8.6–10.4)
Chloride: 106 mmol/L (ref 98–110)
Creat: 0.83 mg/dL (ref 0.50–1.03)
Globulin: 2.6 g/dL (calc) (ref 1.9–3.7)
Glucose, Bld: 94 mg/dL (ref 65–99)
Potassium: 4.1 mmol/L (ref 3.5–5.3)
Sodium: 144 mmol/L (ref 135–146)
Total Bilirubin: 0.3 mg/dL (ref 0.2–1.2)
Total Protein: 6.7 g/dL (ref 6.1–8.1)

## 2021-04-26 LAB — CBC WITH DIFFERENTIAL/PLATELET
Absolute Monocytes: 705 cells/uL (ref 200–950)
Basophils Absolute: 57 cells/uL (ref 0–200)
Basophils Relative: 0.7 %
Eosinophils Absolute: 246 cells/uL (ref 15–500)
Eosinophils Relative: 3 %
HCT: 38.6 % (ref 35.0–45.0)
Hemoglobin: 12.7 g/dL (ref 11.7–15.5)
Lymphs Abs: 2337 cells/uL (ref 850–3900)
MCH: 30.8 pg (ref 27.0–33.0)
MCHC: 32.9 g/dL (ref 32.0–36.0)
MCV: 93.5 fL (ref 80.0–100.0)
MPV: 11.4 fL (ref 7.5–12.5)
Monocytes Relative: 8.6 %
Neutro Abs: 4854 cells/uL (ref 1500–7800)
Neutrophils Relative %: 59.2 %
Platelets: 257 10*3/uL (ref 140–400)
RBC: 4.13 10*6/uL (ref 3.80–5.10)
RDW: 12.1 % (ref 11.0–15.0)
Total Lymphocyte: 28.5 %
WBC: 8.2 10*3/uL (ref 3.8–10.8)

## 2021-04-26 LAB — TSH: TSH: 12.32 mIU/L — ABNORMAL HIGH (ref 0.40–4.50)

## 2021-05-26 ENCOUNTER — Encounter (INDEPENDENT_AMBULATORY_CARE_PROVIDER_SITE_OTHER): Payer: Self-pay

## 2021-06-26 ENCOUNTER — Encounter (INDEPENDENT_AMBULATORY_CARE_PROVIDER_SITE_OTHER): Payer: Self-pay | Admitting: *Deleted

## 2021-07-09 ENCOUNTER — Other Ambulatory Visit (INDEPENDENT_AMBULATORY_CARE_PROVIDER_SITE_OTHER): Payer: Self-pay | Admitting: Internal Medicine

## 2021-07-10 NOTE — Telephone Encounter (Signed)
Last seen 02/13/22 

## 2021-08-10 ENCOUNTER — Ambulatory Visit (INDEPENDENT_AMBULATORY_CARE_PROVIDER_SITE_OTHER): Payer: Medicare Other | Admitting: Gastroenterology

## 2021-08-10 ENCOUNTER — Encounter (INDEPENDENT_AMBULATORY_CARE_PROVIDER_SITE_OTHER): Payer: Self-pay

## 2021-10-02 IMAGING — US US THYROID
1 series · 14 of 25 positions shown · non-contrast
Comparison: 09/14/2013

CLINICAL DATA: 53-year-old female with a history of thyroid goiter

EXAM:
THYROID ULTRASOUND
TECHNIQUE: Ultrasound examination of the thyroid gland and adjacent soft
tissues was performed.

[Series 1: us thyroid · 14 of 69 slices shown]
[im 1/69]
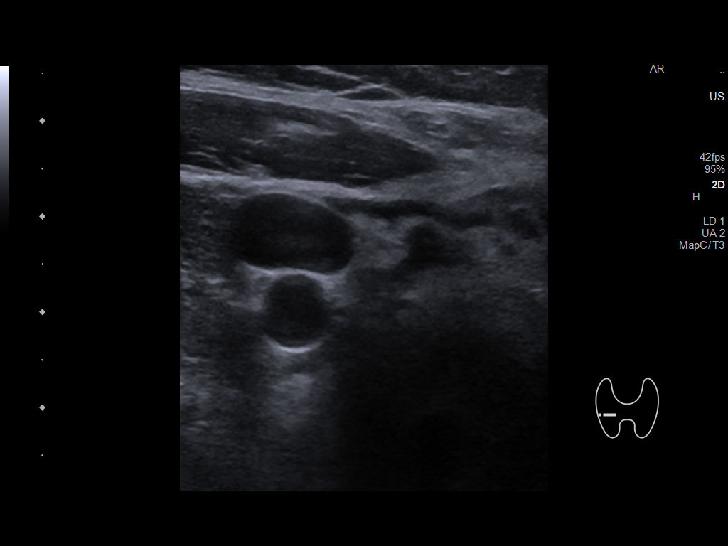
[im 6/69]
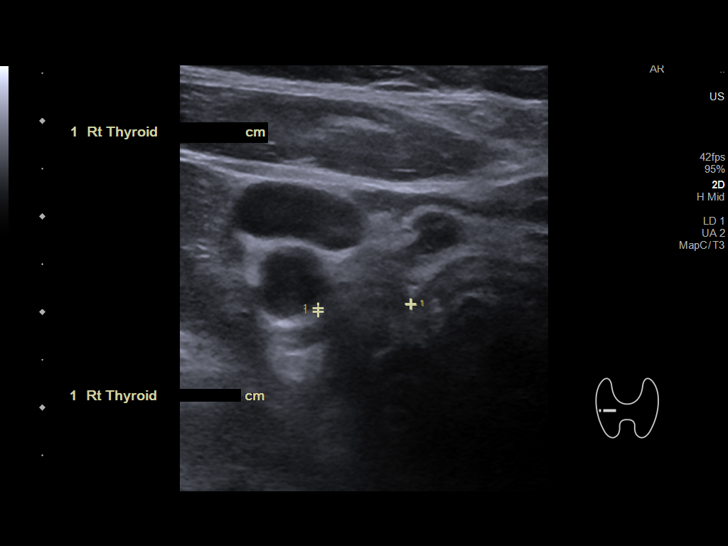
[im 12/69]
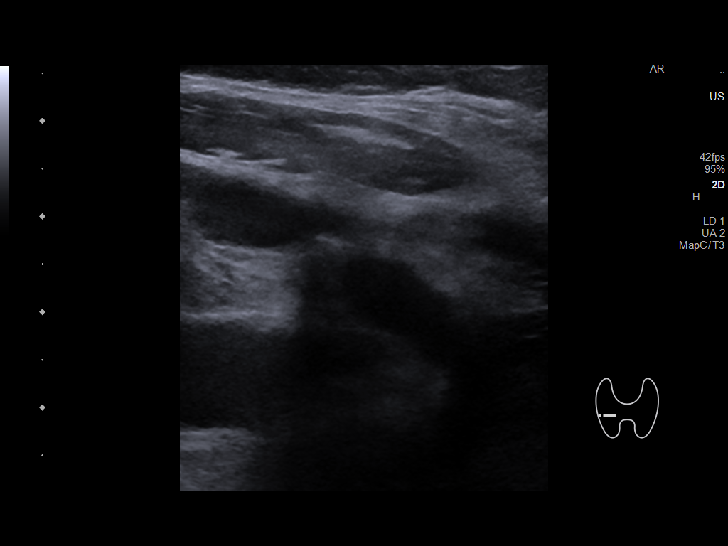
[im 18/69]
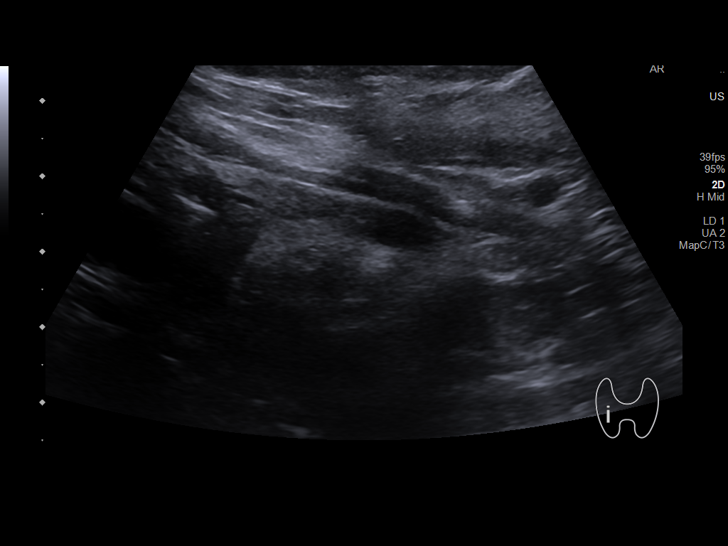
[im 23/69]
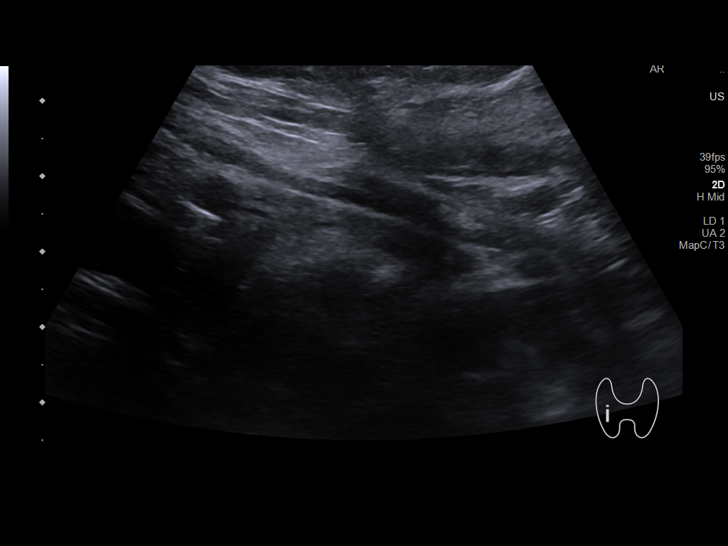
[im 26/69]
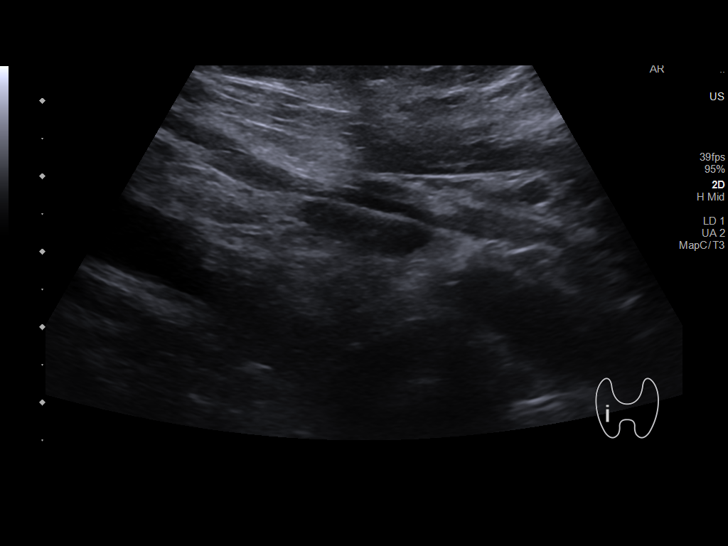
[im 32/69]
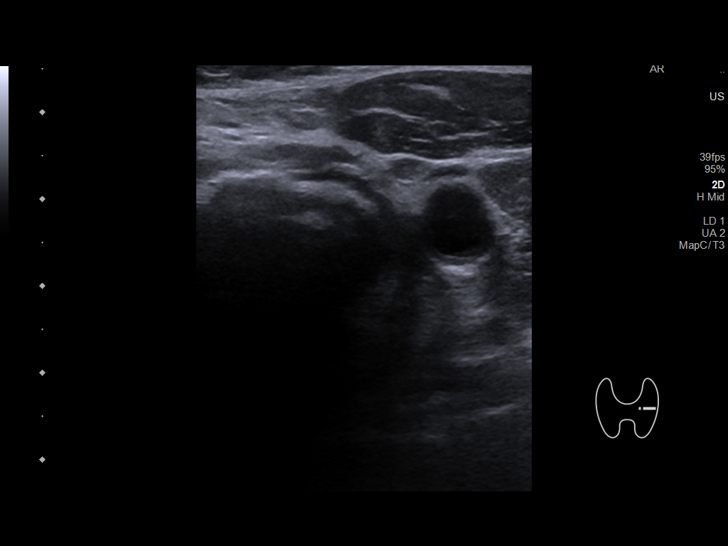
[im 37/69]
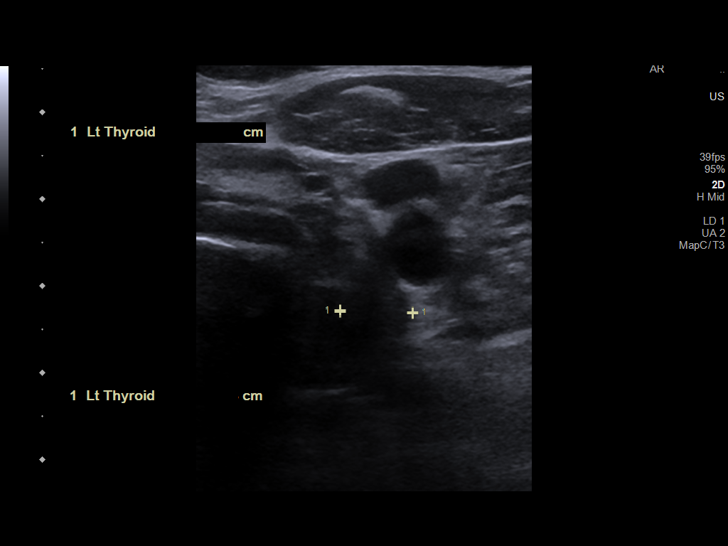
[im 43/69]
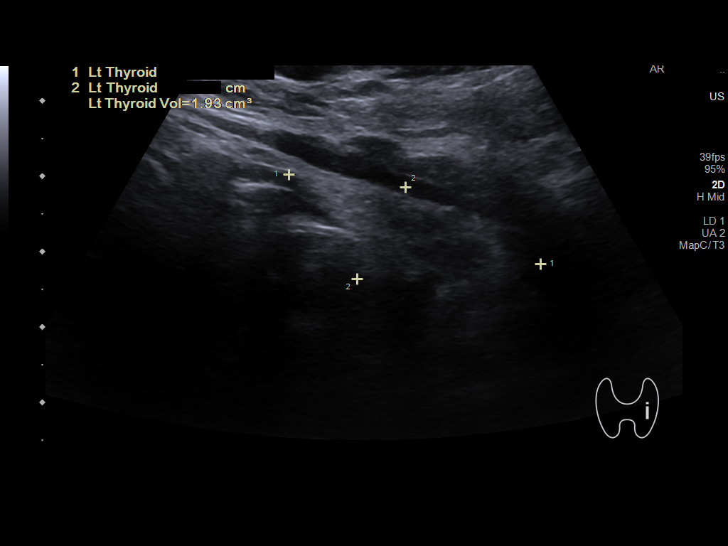
[im 46/69]
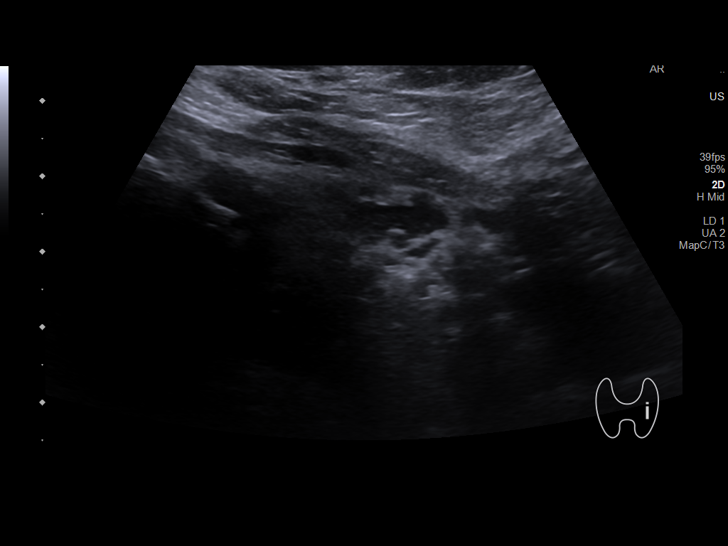
[im 52/69]
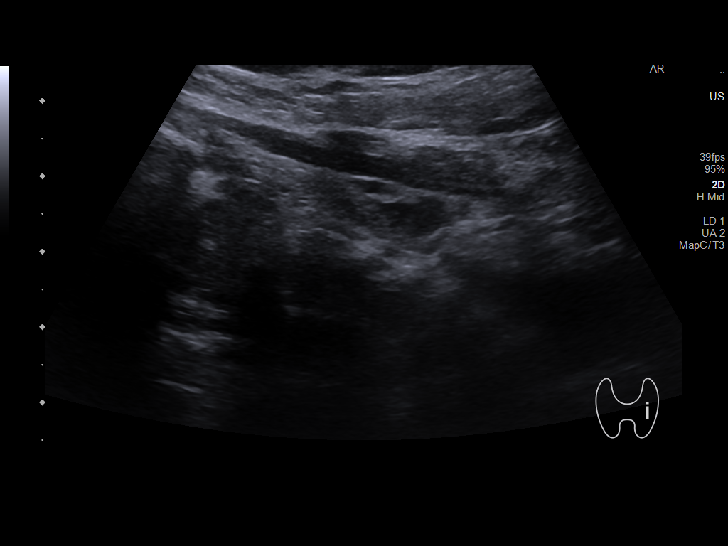
[im 57/69]
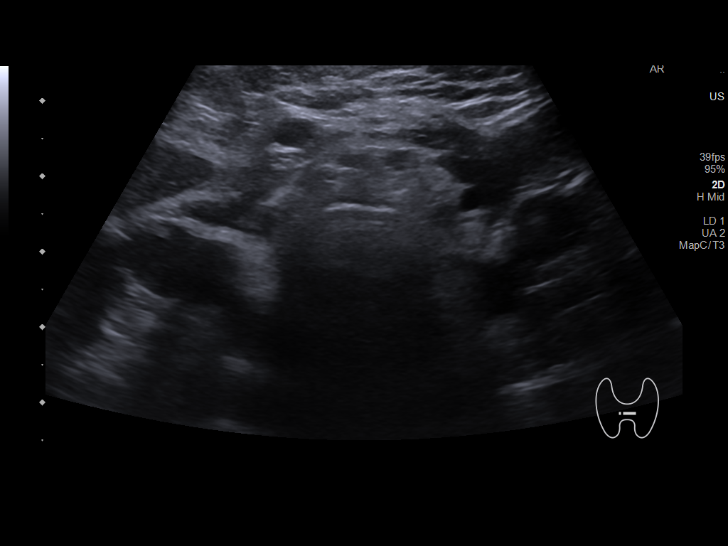
[im 63/69]
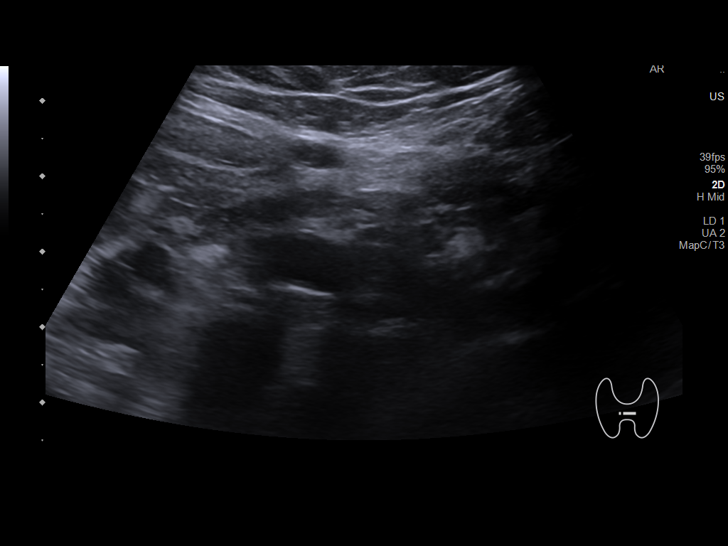
[im 69/69]
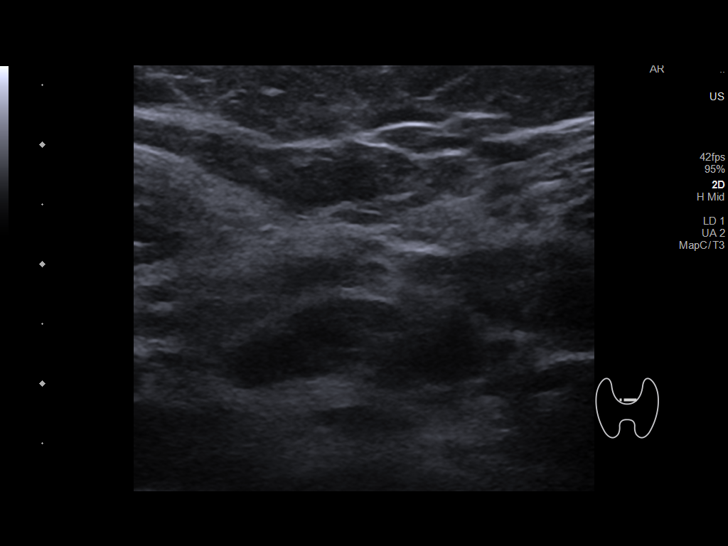

[14 of 25 positions shown; findings below may reference images not displayed]

FINDINGS: Parenchymal Echotexture: Moderately heterogenous

Isthmus: 0.8 cm

Right lobe: 4.2 cm x 0.9 cm x 1.0 cm

Left lobe: 3.5 cm x 1.4 cm x 0.8 cm

_________________________________________________________

Estimated total number of nodules >/= 1 cm: 0

Number of spongiform nodules >/=  2 cm not described below (TR1): 0

Number of mixed cystic and solid nodules >/= 1.5 cm not described
below (TR2): 0

_________________________________________________________

No discrete nodules are seen within the thyroid gland.

No adenopathy
IMPRESSION: Heterogeneous thyroid suggesting medical thyroid disease.

## 2021-10-03 ENCOUNTER — Other Ambulatory Visit (INDEPENDENT_AMBULATORY_CARE_PROVIDER_SITE_OTHER): Payer: Self-pay | Admitting: Internal Medicine

## 2021-10-19 ENCOUNTER — Ambulatory Visit (INDEPENDENT_AMBULATORY_CARE_PROVIDER_SITE_OTHER): Payer: Medicare Other | Admitting: Cardiology

## 2021-10-19 ENCOUNTER — Encounter: Payer: Self-pay | Admitting: Cardiology

## 2021-10-19 ENCOUNTER — Encounter: Payer: Self-pay | Admitting: *Deleted

## 2021-10-19 VITALS — BP 136/90 | HR 68 | Ht 65.0 in | Wt 227.2 lb

## 2021-10-19 DIAGNOSIS — R002 Palpitations: Secondary | ICD-10-CM | POA: Diagnosis not present

## 2021-10-19 DIAGNOSIS — I2510 Atherosclerotic heart disease of native coronary artery without angina pectoris: Secondary | ICD-10-CM | POA: Diagnosis not present

## 2021-10-19 DIAGNOSIS — R0609 Other forms of dyspnea: Secondary | ICD-10-CM

## 2021-10-19 MED ORDER — ASPIRIN 81 MG PO TBEC: 81.0000 mg | DELAYED_RELEASE_TABLET | Freq: Every day | ORAL | Status: AC

## 2021-10-19 NOTE — Progress Notes (Signed)
Clinical Summary Katherine Hayden is a 56 y.o.female seen as new consult, referred by PA Kayleen Memos for the following medical problems.   1.Coronary calcification - 09/2021 CT chest [facility] incidental findings of aortic atherosclerosis, LAD atherosclerosis.  - no specific chest pains. DOE x few months, for example walking in store parking lot.  - CAD risk factors: former tobacco x 15, HL, HTN, borderline DM2   2. COPD - compliant with meds  3. Hyperlipidemia - she is on statin  4. Palpitations - occurs 1-2 times daily, just a few seconds - limited caffeine, ice tea daily x 1 glass, no energy drinks, occasioal EtOH    Past Medical History:  Diagnosis Date   Anxiety    Arthritis    Asthma    Back pain    Cervicogenic headache 08/20/2016   Chronic headaches    Chronic pain    Depression    Emphysema    Emphysema of lung (HCC)    Fatty liver    GERD (gastroesophageal reflux disease)    High cholesterol    Hypertension    Hypothyroidism    IBS (irritable bowel syndrome)    Joint pain    RLS (restless legs syndrome)    Thyroid disease      Allergies  Allergen Reactions   Pneumococcal Vaccines Itching and Other (See Comments)    Patient received Influenza and Pneumococcal vaccines at the same time-caused severe itching and needs medication to reverse reaction, this caused her to go into cardiac arrest at that time.     Amoxicillin Hives    Did it involve swelling of the face/tongue/throat, SOB, or low BP? Yes Did it involve sudden or severe rash/hives, skin peeling, or any reaction on the inside of your mouth or nose? Yes Did you need to seek medical attention at a hospital or doctor's office? Unknown When did it last happen? 6-7 years ago     If all above answers are "NO", may proceed with cephalosporin use.    Codeine Nausea And Vomiting   Hydromorphone Hcl Nausea And Vomiting   Sulfonamide Derivatives Hives   Adhesive [Tape]     Causes red rash/hives    Morphine And Related Hives     Current Outpatient Medications  Medication Sig Dispense Refill   ALPRAZolam (XANAX) 1 MG tablet Take 1 mg by mouth 4 (four) times daily as needed for anxiety.      atorvastatin (LIPITOR) 20 MG tablet Take 20 mg by mouth at bedtime.     baclofen (LIORESAL) 10 MG tablet Take 10 mg by mouth in the morning and at bedtime.     dicyclomine (BENTYL) 10 MG capsule TAKE ONE CAPSULE BY MOUTH THREE TIMES DAILY AS NEEDED FOR SPASMS 90 capsule 2   escitalopram (LEXAPRO) 20 MG tablet Take 20 mg by mouth daily.     esomeprazole (NEXIUM) 40 MG capsule Take 1 capsule (40 mg total) by mouth daily before breakfast. 90 capsule 3   famotidine (PEPCID) 20 MG tablet Take 1 tablet (20 mg total) by mouth at bedtime as needed for heartburn or indigestion.     gabapentin (NEURONTIN) 300 MG capsule Take 300 mg by mouth 3 (three) times daily.     HYDROcodone-acetaminophen (NORCO) 10-325 MG tablet Take 1 tablet by mouth every 6 (six) hours as needed (pain.).      loperamide (IMODIUM) 2 MG capsule Take 1 capsule (2 mg total) by mouth daily with breakfast.     losartan (  COZAAR) 50 MG tablet Take 50 mg by mouth daily.     meclizine (ANTIVERT) 25 MG tablet Take 25 mg by mouth 3 (three) times daily as needed for dizziness.     montelukast (SINGULAIR) 10 MG tablet Take 10 mg by mouth at bedtime.     ondansetron (ZOFRAN) 4 MG tablet Take 1 tablet (4 mg total) by mouth every 8 (eight) hours as needed for nausea or vomiting. 20 tablet 0   PROAIR HFA 108 (90 BASE) MCG/ACT inhaler Inhale 2 puffs into the lungs every 4 (four) hours as needed for wheezing or shortness of breath.      propranolol (INDERAL) 80 MG tablet Take 80 mg by mouth daily.     RESTASIS 0.05 % ophthalmic emulsion Place 1 drop into both eyes 2 (two) times daily.     rizatriptan (MAXALT) 10 MG tablet Take 10 mg by mouth as needed for migraine. May repeat in 2 hours if needed     rOPINIRole (REQUIP) 4 MG tablet Take 4 mg by mouth at  bedtime.     SYNTHROID 150 MCG tablet Take 1 tablet (150 mcg total) by mouth daily before breakfast. (Patient taking differently: Take 200 mcg by mouth daily before breakfast.) 90 tablet 1   traZODone (DESYREL) 100 MG tablet Take 100 mg by mouth at bedtime.     valACYclovir (VALTREX) 1000 MG tablet Take 1,000 mg by mouth 2 (two) times daily as needed (cold sores.).     No current facility-administered medications for this visit.     Past Surgical History:  Procedure Laterality Date   ANTERIOR CERVICAL DECOMP/DISCECTOMY FUSION N/A 12/17/2018   Procedure: ANTERIOR CERVICAL DECOMPRESSION/DISCECTOMY FUSION CERVIAL FIVE THROUGH SIX;  Surgeon: Melina Schools, MD;  Location: [place];  Service: Orthopedics;  Laterality: N/A;  2.5 hrs   APPENDECTOMY     BACK SURGERY     x4   BIOPSY  06/12/2019   Procedure: BIOPSY;  Surgeon: Rogene Houston, MD;  [facility];  Service: Endoscopy;;  random, sigmoid   BREAST REDUCTION SURGERY Bilateral 08/06/2019   Procedure: BILATERAL MAMMARY REDUCTION (BREAST) WITH LIPOSUCTION;  Surgeon: Wallace Going, DO;  Location: [place];  Service: Plastics;  Laterality: Bilateral;  3 hours   CHOLECYSTECTOMY  2010   COLONOSCOPY W/ POLYPECTOMY  07/2013   1 polyp removal   COLONOSCOPY WITH PROPOFOL N/A 06/12/2019   Procedure: COLONOSCOPY WITH PROPOFOL;  Surgeon: Rogene Houston, MD;  [facility];  Service: Endoscopy;  Laterality: N/A;  825   CYST REMOVAL HAND  2015   EXCISION MASS UPPER EXTREMETIES Left 02/25/2017   Procedure: EXCISION MASS UPPER EXTREMETIES, LEFT PALM;  Surgeon: Leanora Cover, MD;  Location: [place];  Service: Orthopedics;  Laterality: Left;   POLYPECTOMY  06/12/2019   Procedure: POLYPECTOMY;  Surgeon: Rogene Houston, MD;  [facility];  Service: Endoscopy;;  ascending;hepatic flexurex2;splenic flexure     Allergies  Allergen Reactions   Pneumococcal Vaccines Itching and Other (See  Comments)    Patient received Influenza and Pneumococcal vaccines at the same time-caused severe itching and needs medication to reverse reaction, this caused her to go into cardiac arrest at that time.     Amoxicillin Hives    Did it involve swelling of the face/tongue/throat, SOB, or low BP? Yes Did it involve sudden or severe rash/hives, skin peeling, or any reaction on the inside of your mouth or nose? Yes Did you need to  seek medical attention at a hospital or doctor's office? Unknown When did it last happen? 6-7 years ago     If all above answers are "NO", may proceed with cephalosporin use.    Codeine Nausea And Vomiting   Hydromorphone Hcl Nausea And Vomiting   Sulfonamide Derivatives Hives   Adhesive [Tape]     Causes red rash/hives   Morphine And Related Hives      Family History  Problem Relation Age of Onset   Hypertension Mother    Depression Mother    Anxiety disorder Mother    Hypertension Father    Heart disease Maternal Grandfather    Heart disease Paternal Grandmother      Social History Katherine Hayden reports that she quit smoking about 2 years ago. She has never used smokeless tobacco. Katherine Hayden reports current alcohol use.   Review of Systems CONSTITUTIONAL: No weight loss, fever, chills, weakness or fatigue.  HEENT: Eyes: No visual loss, blurred vision, double vision or yellow sclerae.No hearing loss, sneezing, congestion, runny nose or sore throat.  SKIN: No rash or itching.  CARDIOVASCULAR: per hpi RESPIRATORY: per hpi GASTROINTESTINAL: No anorexia, nausea, vomiting or diarrhea. No abdominal pain or blood.  GENITOURINARY: No burning on urination, no polyuria NEUROLOGICAL: No headache, dizziness, syncope, paralysis, ataxia, numbness or tingling in the extremities. No change in bowel or bladder control.  MUSCULOSKELETAL: No muscle, back pain, joint pain or stiffness.  LYMPHATICS: No enlarged nodes. No history of splenectomy.  PSYCHIATRIC: No  history of depression or anxiety.  ENDOCRINOLOGIC: No reports of sweating, cold or heat intolerance. No polyuria or polydipsia.  Marland Kitchen   Physical Examination Today's Vitals   10/19/21 1500  BP: 136/90  Pulse: 68  SpO2: 96%  Weight: 227 lb 3.2 oz (103.1 kg)  Height: '5\' 5"'$  (1.651 m)   Body mass index is 37.81 kg/m.  Gen: resting comfortably, no acute distress HEENT: no scleral icterus, pupils equal round and reactive, no palptable cervical adenopathy,  CV: RRR, no m/rg no jvd Resp: Clear to auscultation bilaterally GI: abdomen is soft, non-tender, non-distended, normal bowel sounds, no hepatosplenomegaly MSK: extremities are warm, no edema.  Skin: warm, no rash Neuro:  no focal deficits Psych: appropriate affect     Assessment and Plan  1.Coronary atherosclerosis - incidental finding on chest CT - no chest pains but DOE, unclear if solely related to her COPD - will plan for GXT for further cardiac risk stratification - she is on statin, add ASA '81mg'$  daily -baseline EKG shows SR, no acute ischemic changes  2. Palpitations - short infrequent symptoms, monitor at this time. If significant progression could obtain home monitor   F/u pending stress results, if benign just f/u as needed       Arnoldo Lenis, M.D.

## 2021-10-19 NOTE — Patient Instructions (Signed)
Medication Instructions:  Begin Aspirin '81mg'$  daily  Continue all other medications.     Labwork: none  Testing/Procedures: Your physician has requested that you have an exercise tolerance test. For further information please visit [email or web address]. Please also follow instruction sheet, as given.  Office will contact with results via phone, letter or mychart.     Follow-Up:  Pending test results   Any Other Special Instructions Will Be Listed Below (If Applicable).   If you need a refill on your cardiac medications before your next appointment, please call your pharmacy.

## 2021-10-23 ENCOUNTER — Ambulatory Visit (HOSPITAL_COMMUNITY)
Admission: RE | Admit: 2021-10-23 | Discharge: 2021-10-23 | Disposition: A | Discharge status: Home / Self Care | Payer: Medicare Other | Source: Ambulatory Visit | Attending: Cardiology | Admitting: Cardiology

## 2021-10-23 DIAGNOSIS — R0609 Other forms of dyspnea: Secondary | ICD-10-CM

## 2021-10-24 LAB — EXERCISE TOLERANCE TEST
Angina Index: 0
Duke Treadmill Score: 5
Estimated workload: 7
Exercise duration (min): 5 min
Exercise duration (sec): 4 s
MPHR: 162 {beats}/min
Peak HR: 126 {beats}/min
Percent HR: 77 %
RPE: 15
Rest HR: 91 {beats}/min
ST Depression (mm): 0 mm

## 2021-10-25 ENCOUNTER — Telehealth: Payer: Self-pay | Admitting: Cardiology

## 2021-10-25 NOTE — Telephone Encounter (Signed)
Follow Up:     Patient said she was returning a call from today. She was not sure who called her.

## 2021-10-25 NOTE — Progress Notes (Signed)
lmtcb

## 2021-10-25 NOTE — Telephone Encounter (Signed)
Addressed in results note

## 2021-11-15 ENCOUNTER — Encounter (INDEPENDENT_AMBULATORY_CARE_PROVIDER_SITE_OTHER): Payer: Self-pay

## 2021-11-23 ENCOUNTER — Telehealth: Payer: Self-pay | Admitting: *Deleted

## 2021-11-23 NOTE — Telephone Encounter (Signed)
Patient is questioning if she has to remain on the ASA since stress test did not show any blockages.

## 2021-11-23 NOTE — Telephone Encounter (Signed)
Sung Amabile, CMA  10/25/2021  3:58 PM EDT     Patient made are of results . Wants to know if she should continue taking the aspirin everyday?   Arnoldo Lenis, MD  10/25/2021  2:10 PM EDT     Stress test overall looks good. Did not quite get heart rate as fast as we needed but I think overall findigns are reassuring that there are not any significant blockages in her heart     J BrancH MD

## 2021-11-24 NOTE — Telephone Encounter (Signed)
Has some evidence of plaque on CT scan but stress test shows plaque is not severe. Given she has some plaque in the arteries would recommend contnuing aspirn as this can help prevent plaque from worsening  Katherine Abts MD

## 2021-11-28 ENCOUNTER — Encounter: Payer: Self-pay | Admitting: *Deleted

## 2021-11-28 NOTE — Telephone Encounter (Signed)
Replied via mychart.

## 2021-12-31 ENCOUNTER — Other Ambulatory Visit (INDEPENDENT_AMBULATORY_CARE_PROVIDER_SITE_OTHER): Payer: Self-pay | Admitting: Gastroenterology

## 2022-04-01 ENCOUNTER — Other Ambulatory Visit (INDEPENDENT_AMBULATORY_CARE_PROVIDER_SITE_OTHER): Payer: Self-pay | Admitting: Gastroenterology

## 2022-04-11 DIAGNOSIS — R32 Unspecified urinary incontinence: Secondary | ICD-10-CM | POA: Diagnosis not present

## 2022-05-02 DIAGNOSIS — H5203 Hypermetropia, bilateral: Secondary | ICD-10-CM | POA: Diagnosis not present

## 2022-05-02 DIAGNOSIS — H524 Presbyopia: Secondary | ICD-10-CM | POA: Diagnosis not present

## 2022-05-03 ENCOUNTER — Other Ambulatory Visit (INDEPENDENT_AMBULATORY_CARE_PROVIDER_SITE_OTHER): Payer: Self-pay | Admitting: Internal Medicine

## 2022-05-10 DIAGNOSIS — R32 Unspecified urinary incontinence: Secondary | ICD-10-CM | POA: Diagnosis not present

## 2022-05-11 ENCOUNTER — Other Ambulatory Visit (INDEPENDENT_AMBULATORY_CARE_PROVIDER_SITE_OTHER): Payer: Self-pay | Admitting: Gastroenterology

## 2022-06-10 DIAGNOSIS — R32 Unspecified urinary incontinence: Secondary | ICD-10-CM | POA: Diagnosis not present

## 2022-06-11 DIAGNOSIS — R739 Hyperglycemia, unspecified: Secondary | ICD-10-CM | POA: Diagnosis not present

## 2022-06-11 DIAGNOSIS — E039 Hypothyroidism, unspecified: Secondary | ICD-10-CM | POA: Diagnosis not present

## 2022-06-11 DIAGNOSIS — M545 Low back pain, unspecified: Secondary | ICD-10-CM | POA: Diagnosis not present

## 2022-06-11 DIAGNOSIS — E782 Mixed hyperlipidemia: Secondary | ICD-10-CM | POA: Diagnosis not present

## 2022-06-11 DIAGNOSIS — E7849 Other hyperlipidemia: Secondary | ICD-10-CM | POA: Diagnosis not present

## 2022-06-11 DIAGNOSIS — M542 Cervicalgia: Secondary | ICD-10-CM | POA: Diagnosis not present

## 2022-06-11 DIAGNOSIS — K219 Gastro-esophageal reflux disease without esophagitis: Secondary | ICD-10-CM | POA: Diagnosis not present

## 2022-06-11 DIAGNOSIS — F411 Generalized anxiety disorder: Secondary | ICD-10-CM | POA: Diagnosis not present

## 2022-06-11 DIAGNOSIS — Z6837 Body mass index (BMI) 37.0-37.9, adult: Secondary | ICD-10-CM | POA: Diagnosis not present

## 2022-06-11 DIAGNOSIS — I10 Essential (primary) hypertension: Secondary | ICD-10-CM | POA: Diagnosis not present

## 2022-06-11 DIAGNOSIS — S39012A Strain of muscle, fascia and tendon of lower back, initial encounter: Secondary | ICD-10-CM | POA: Diagnosis not present

## 2022-07-01 ENCOUNTER — Other Ambulatory Visit (INDEPENDENT_AMBULATORY_CARE_PROVIDER_SITE_OTHER): Payer: Self-pay | Admitting: Gastroenterology

## 2022-08-12 DIAGNOSIS — R109 Unspecified abdominal pain: Secondary | ICD-10-CM | POA: Diagnosis not present

## 2022-08-12 DIAGNOSIS — Z981 Arthrodesis status: Secondary | ICD-10-CM | POA: Diagnosis not present

## 2022-08-12 DIAGNOSIS — E039 Hypothyroidism, unspecified: Secondary | ICD-10-CM | POA: Diagnosis not present

## 2022-08-12 DIAGNOSIS — I10 Essential (primary) hypertension: Secondary | ICD-10-CM | POA: Diagnosis not present

## 2022-08-12 DIAGNOSIS — Z87891 Personal history of nicotine dependence: Secondary | ICD-10-CM | POA: Diagnosis not present

## 2022-08-12 DIAGNOSIS — R1084 Generalized abdominal pain: Secondary | ICD-10-CM | POA: Diagnosis not present

## 2022-08-12 DIAGNOSIS — K760 Fatty (change of) liver, not elsewhere classified: Secondary | ICD-10-CM | POA: Diagnosis not present

## 2022-08-12 DIAGNOSIS — Z1152 Encounter for screening for COVID-19: Secondary | ICD-10-CM | POA: Diagnosis not present

## 2022-08-12 DIAGNOSIS — R110 Nausea: Secondary | ICD-10-CM | POA: Diagnosis not present

## 2022-08-12 DIAGNOSIS — R197 Diarrhea, unspecified: Secondary | ICD-10-CM | POA: Diagnosis not present

## 2022-08-23 ENCOUNTER — Telehealth: Payer: Self-pay | Admitting: Gastroenterology

## 2022-08-23 NOTE — Telephone Encounter (Signed)
Okay to book for new patient visit in the office.  Thanks

## 2022-08-23 NOTE — Telephone Encounter (Signed)
Good afternoon Dr. Adela Lank the following patient was referred to [place] Abd pain and Nausea. She is a former patient of [facility] and no longer wants to continue care because she isnt happy with them or their care. Records are available for review in Epic. Please advise of scheduling

## 2022-08-29 ENCOUNTER — Encounter: Payer: Self-pay | Admitting: Gastroenterology

## 2022-08-29 NOTE — Telephone Encounter (Signed)
PT is scheduled for 8/28 at 230pm

## 2022-09-12 DIAGNOSIS — E039 Hypothyroidism, unspecified: Secondary | ICD-10-CM | POA: Diagnosis not present

## 2022-09-12 DIAGNOSIS — R5383 Other fatigue: Secondary | ICD-10-CM | POA: Diagnosis not present

## 2022-09-12 DIAGNOSIS — R739 Hyperglycemia, unspecified: Secondary | ICD-10-CM | POA: Diagnosis not present

## 2022-09-12 DIAGNOSIS — K219 Gastro-esophageal reflux disease without esophagitis: Secondary | ICD-10-CM | POA: Diagnosis not present

## 2022-09-12 DIAGNOSIS — E782 Mixed hyperlipidemia: Secondary | ICD-10-CM | POA: Diagnosis not present

## 2022-09-12 DIAGNOSIS — S39012A Strain of muscle, fascia and tendon of lower back, initial encounter: Secondary | ICD-10-CM | POA: Diagnosis not present

## 2022-09-12 DIAGNOSIS — I10 Essential (primary) hypertension: Secondary | ICD-10-CM | POA: Diagnosis not present

## 2022-09-12 DIAGNOSIS — M542 Cervicalgia: Secondary | ICD-10-CM | POA: Diagnosis not present

## 2022-09-12 DIAGNOSIS — F411 Generalized anxiety disorder: Secondary | ICD-10-CM | POA: Diagnosis not present

## 2022-09-12 DIAGNOSIS — M545 Low back pain, unspecified: Secondary | ICD-10-CM | POA: Diagnosis not present

## 2022-09-12 DIAGNOSIS — Z6838 Body mass index (BMI) 38.0-38.9, adult: Secondary | ICD-10-CM | POA: Diagnosis not present

## 2022-09-12 DIAGNOSIS — E7849 Other hyperlipidemia: Secondary | ICD-10-CM | POA: Diagnosis not present

## 2022-10-09 DIAGNOSIS — Z122 Encounter for screening for malignant neoplasm of respiratory organs: Secondary | ICD-10-CM | POA: Diagnosis not present

## 2022-10-09 DIAGNOSIS — F17210 Nicotine dependence, cigarettes, uncomplicated: Secondary | ICD-10-CM | POA: Diagnosis not present

## 2022-10-09 DIAGNOSIS — Z87891 Personal history of nicotine dependence: Secondary | ICD-10-CM | POA: Diagnosis not present

## 2022-10-10 DIAGNOSIS — Z6838 Body mass index (BMI) 38.0-38.9, adult: Secondary | ICD-10-CM | POA: Diagnosis not present

## 2022-10-10 DIAGNOSIS — R059 Cough, unspecified: Secondary | ICD-10-CM | POA: Diagnosis not present

## 2022-10-10 DIAGNOSIS — J069 Acute upper respiratory infection, unspecified: Secondary | ICD-10-CM | POA: Diagnosis not present

## 2022-10-10 DIAGNOSIS — E669 Obesity, unspecified: Secondary | ICD-10-CM | POA: Diagnosis not present

## 2022-10-10 DIAGNOSIS — I10 Essential (primary) hypertension: Secondary | ICD-10-CM | POA: Diagnosis not present

## 2022-10-17 DIAGNOSIS — I700 Atherosclerosis of aorta: Secondary | ICD-10-CM | POA: Diagnosis not present

## 2022-10-17 DIAGNOSIS — Z6837 Body mass index (BMI) 37.0-37.9, adult: Secondary | ICD-10-CM | POA: Diagnosis not present

## 2022-10-17 DIAGNOSIS — J439 Emphysema, unspecified: Secondary | ICD-10-CM | POA: Diagnosis not present

## 2022-10-17 DIAGNOSIS — J209 Acute bronchitis, unspecified: Secondary | ICD-10-CM | POA: Diagnosis not present

## 2022-10-17 DIAGNOSIS — R030 Elevated blood-pressure reading, without diagnosis of hypertension: Secondary | ICD-10-CM | POA: Diagnosis not present

## 2022-11-04 DIAGNOSIS — Z6838 Body mass index (BMI) 38.0-38.9, adult: Secondary | ICD-10-CM | POA: Diagnosis not present

## 2022-11-04 DIAGNOSIS — M25561 Pain in right knee: Secondary | ICD-10-CM | POA: Diagnosis not present

## 2022-11-04 DIAGNOSIS — R030 Elevated blood-pressure reading, without diagnosis of hypertension: Secondary | ICD-10-CM | POA: Diagnosis not present

## 2022-11-04 DIAGNOSIS — E669 Obesity, unspecified: Secondary | ICD-10-CM | POA: Diagnosis not present

## 2022-12-05 ENCOUNTER — Encounter: Payer: Self-pay | Admitting: Gastroenterology

## 2022-12-05 ENCOUNTER — Ambulatory Visit (INDEPENDENT_AMBULATORY_CARE_PROVIDER_SITE_OTHER): Payer: Medicare HMO | Admitting: Gastroenterology

## 2022-12-05 ENCOUNTER — Other Ambulatory Visit (INDEPENDENT_AMBULATORY_CARE_PROVIDER_SITE_OTHER): Payer: Medicare HMO

## 2022-12-05 VITALS — BP 138/80 | HR 69 | Ht 65.0 in | Wt 234.0 lb

## 2022-12-05 DIAGNOSIS — K529 Noninfective gastroenteritis and colitis, unspecified: Secondary | ICD-10-CM | POA: Diagnosis not present

## 2022-12-05 DIAGNOSIS — K219 Gastro-esophageal reflux disease without esophagitis: Secondary | ICD-10-CM

## 2022-12-05 DIAGNOSIS — F1190 Opioid use, unspecified, uncomplicated: Secondary | ICD-10-CM

## 2022-12-05 DIAGNOSIS — K760 Fatty (change of) liver, not elsewhere classified: Secondary | ICD-10-CM

## 2022-12-05 DIAGNOSIS — R6881 Early satiety: Secondary | ICD-10-CM

## 2022-12-05 DIAGNOSIS — R110 Nausea: Secondary | ICD-10-CM

## 2022-12-05 LAB — HEPATIC FUNCTION PANEL
ALT: 19 U/L (ref 0–35)
AST: 21 U/L (ref 0–37)
Albumin: 3.9 g/dL (ref 3.5–5.2)
Alkaline Phosphatase: 104 U/L (ref 39–117)
Bilirubin, Direct: 0.1 mg/dL (ref 0.0–0.3)
Total Bilirubin: 0.5 mg/dL (ref 0.2–1.2)
Total Protein: 7 g/dL (ref 6.0–8.3)

## 2022-12-05 MED ORDER — IBGARD 90 MG PO CPCR: ORAL_CAPSULE | ORAL | Status: DC

## 2022-12-05 MED ORDER — COLESTIPOL HCL 1 G PO TABS: 2.0000 g | ORAL_TABLET | Freq: Two times a day (BID) | ORAL | 1 refills | Status: DC

## 2022-12-05 NOTE — Progress Notes (Signed)
HPI :  57 year old female with a history of GERD, tubular adenomas, COPD, chronic diarrhea, sarcoidosis, referred by Lianne Moris, PA for further evaluation of chronic diarrhea and abdominal pain.  She requested to be seen by our office for second opinion.  She has been followed by Dr. Karilyn Cota in the past, last seen by his office on 04/2021.   She reports problems with her bowels ongoing for at least the past 3 years or so.  She states every time she eats something she will have the urge to have a bowel movement.  She is having usually 5-6 stools per day.  Stools are typically loose and watery without blood.  She frequently feels abdominal discomfort and cramping throughout her mid abdomen.  She is not sure if having a bowel movement will make this better or not.  She usually has pains most of the day.  She also feels significantly nauseated most days of the week, usually after eating.  She has some early satiety with this as well.  She does not vomit.  She has a history of GERD, takes Nexium 20 mg daily, states this usually does a good job controlling her reflux symptoms and that is not really bothering her too much.  She has been using Zofran as needed for nausea which does help when she takes it, she typically takes this every other day.  She denies any particular trigger foods that will make her feel poorly.  She has tried some Bentyl for help with this over the years and that does not really provide much help for her.  She is try some Pepto-Bismol as well.  She reports she had her gallbladder out in 2010, also has a history of appendectomy.  She has not really tried anything else for her bowels.  Sounds like she had a remote EGD about 15 years ago.  She denies any family history of IBD.  She had a colonoscopy for screening purposes in March 2021 with 4 adenomas removed, biopsies were also taken throughout her colon which were negative for microscopic colitis.  She is also had serologic testing for  celiac disease which was negative as well as normal thyroid testing.  Otherwise on review of imaging she had a CT scan of her abdomen pelvis in May of this year, report is located in care everywhere.  That did not show any cause for her pain.  Incidentally she was noted to have hepatic steatosis.  She is unaware of this.  She states she usually drinks socially perhaps 1 night per week, perhaps 5 drinks at a time.  She does not drink frequently.  She has gained weight over the past year.  Weight is currently 234 pounds with a BMI of 38.9.  Labs from May 5 show alk phos of 129, ALT 43, AST 33, bilirubin normal.  She otherwise endorses chronic back pain for years.  She is on Norco for this chronically and states she is on a stable regimen of it.  Colonoscopy March 2021 - Dr. Karilyn Cota - . 4 small tubular adenomas removed.  Random biopsies from proximal and distal colon were negative for microscopic or collagenous colitis.     Celiac disease panel in January 2021 negative. Serum IgA was 485.    CT scan abdomen pelvis 08/12/22 -  Impression  1. No CT evidence of acute abdominal/pelvic process. 2. Hepatic steatosis. 3. Status post cholecystectomy and appendectomy. 4. Posterior spinal and interbody fusion at L4-S1.     Past  Medical History:  Diagnosis Date   Anxiety    Arthritis    Asthma    Back pain    Cervicogenic headache 08/20/2016   Chronic headaches    Chronic pain    COPD (chronic obstructive pulmonary disease) (HCC)    Depression    Emphysema    Emphysema of lung (HCC)    Fatty liver    GERD (gastroesophageal reflux disease)    High cholesterol    Hypertension    Hypothyroidism    IBS (irritable bowel syndrome)    Joint pain    RLS (restless legs syndrome)    Thyroid disease      Past Surgical History:  Procedure Laterality Date   ANTERIOR CERVICAL DECOMP/DISCECTOMY FUSION N/A 12/17/2018   Procedure: ANTERIOR CERVICAL DECOMPRESSION/DISCECTOMY FUSION CERVIAL FIVE THROUGH  SIX;  Surgeon: Venita Lick, MD;  Location: [facility];  Service: Orthopedics;  Laterality: N/A;  2.5 hrs   APPENDECTOMY     BACK SURGERY     x4   BIOPSY  06/12/2019   Procedure: BIOPSY;  Surgeon: Malissa Hippo, MD;  Location: [facility];  Service: Endoscopy;;  random, sigmoid   BREAST REDUCTION SURGERY Bilateral 08/06/2019   Procedure: BILATERAL MAMMARY REDUCTION (BREAST) WITH LIPOSUCTION;  Surgeon: Peggye Form, DO;  Location: [facility];  Service: Plastics;  Laterality: Bilateral;  3 hours   CHOLECYSTECTOMY  2010   COLONOSCOPY W/ POLYPECTOMY  07/2013   1 polyp removal   COLONOSCOPY WITH PROPOFOL N/A 06/12/2019   Procedure: COLONOSCOPY WITH PROPOFOL;  Surgeon: Malissa Hippo, MD;  Location: [facility];  Service: Endoscopy;  Laterality: N/A;  825   CYST REMOVAL HAND  2015   EXCISION MASS UPPER EXTREMETIES Left 02/25/2017   Procedure: EXCISION MASS UPPER EXTREMETIES, LEFT PALM;  Surgeon: Betha Loa, MD;  Location: [facility];  Service: Orthopedics;  Laterality: Left;   POLYPECTOMY  06/12/2019   Procedure: POLYPECTOMY;  Surgeon: Malissa Hippo, MD;  Location: [facility];  Service: Endoscopy;;  ascending;hepatic flexurex2;splenic flexure   Family History  Problem Relation Age of Onset   Hypertension Mother    Depression Mother    Anxiety disorder Mother    Hypertension Father    Heart disease Maternal Grandfather    Heart disease Paternal Grandmother    Colon cancer Neg Hx    Stomach cancer Neg Hx    Esophageal cancer Neg Hx    Social History   Tobacco Use   Smoking status: Former    Current packs/day: 0.00    Types: Cigarettes    Quit date: 06/15/2011    Years since quitting: 11.4   Smokeless tobacco: Never  Vaping Use   Vaping status: Never Used  Substance Use Topics   Alcohol use: Yes    Comment: rare   Drug use: No   Current Outpatient Medications  Medication Sig Dispense Refill   ALPRAZolam (XANAX) 1 MG tablet Take 1 mg by  mouth 4 (four) times daily as needed for anxiety.      aspirin EC 81 MG tablet Take 1 tablet (81 mg total) by mouth daily. Swallow whole.     atorvastatin (LIPITOR) 20 MG tablet Take 20 mg by mouth at bedtime.     baclofen (LIORESAL) 10 MG tablet Take 10 mg by mouth in the morning and at bedtime.     dicyclomine (BENTYL) 10 MG capsule TAKE ONE CAPSULE BY MOUTH THREE TIMES DAILY AS NEEDED FOR SPASMS 90 capsule  2   escitalopram (LEXAPRO) 20 MG tablet Take 20 mg by mouth daily.     esomeprazole (NEXIUM) 40 MG capsule Take 1 capsule (40 mg total) by mouth daily before breakfast. 90 capsule 3   HYDROcodone-acetaminophen (NORCO) 10-325 MG tablet Take 1 tablet by mouth every 6 (six) hours as needed (pain.).      losartan (COZAAR) 50 MG tablet Take 50 mg by mouth daily.     meclizine (ANTIVERT) 25 MG tablet Take 25 mg by mouth 3 (three) times daily as needed for dizziness.     montelukast (SINGULAIR) 10 MG tablet Take 10 mg by mouth at bedtime.     ondansetron (ZOFRAN) 4 MG tablet Take 1 tablet (4 mg total) by mouth every 8 (eight) hours as needed for nausea or vomiting. 20 tablet 0   PROAIR HFA 108 (90 BASE) MCG/ACT inhaler Inhale 2 puffs into the lungs every 4 (four) hours as needed for wheezing or shortness of breath.      propranolol (INDERAL) 80 MG tablet Take 80 mg by mouth daily.     QUEtiapine (SEROQUEL) 300 MG tablet 1 tablet in the morning and 1.5 tablets at bedtime     RESTASIS 0.05 % ophthalmic emulsion Place 1 drop into both eyes 2 (two) times daily.     rOPINIRole (REQUIP) 4 MG tablet Take 4 mg by mouth at bedtime.     SYNTHROID 150 MCG tablet Take 1 tablet (150 mcg total) by mouth daily before breakfast. (Patient taking differently: Take 200 mcg by mouth daily before breakfast.) 90 tablet 1   traZODone (DESYREL) 100 MG tablet Take 100 mg by mouth at bedtime as needed.     gabapentin (NEURONTIN) 300 MG capsule Take 300 mg by mouth 3 (three) times daily. (Patient not taking: Reported on  10/19/2021)     No current facility-administered medications for this visit.   Allergies  Allergen Reactions   Pneumococcal Vaccines Itching and Other (See Comments)    Patient received Influenza and Pneumococcal vaccines at the same time-caused severe itching and needs medication to reverse reaction, this caused her to go into cardiac arrest at that time.     Amoxicillin Hives    Did it involve swelling of the face/tongue/throat, SOB, or low BP? Yes Did it involve sudden or severe rash/hives, skin peeling, or any reaction on the inside of your mouth or nose? Yes Did you need to seek medical attention at a hospital or doctor's office? Unknown When did it last happen? 6-7 years ago     If all above answers are "NO", may proceed with cephalosporin use.    Codeine Nausea And Vomiting   Hydromorphone Hcl Nausea And Vomiting   Sulfonamide Derivatives Hives   Adhesive [Tape]     Causes red rash/hives   Morphine And Codeine Hives     Review of Systems: All systems reviewed and negative except where noted in HPI.   Labs per HPI, and reviewed in care everywhere.  Physical Exam: BP 138/80   Pulse 69   Ht 5\' 5"  (1.651 m)   Wt 234 lb (106.1 kg)   LMP 12/15/2013 (Approximate)   BMI 38.94 kg/m  Constitutional: Pleasant,well-developed, female in no acute distress. HEENT: Normocephalic and atraumatic. Conjunctivae are normal. No scleral icterus. Neck supple.  Cardiovascular: Normal rate, regular rhythm.  Pulmonary/chest: Effort normal and breath sounds normal. No wheezing, rales or rhonchi. Abdominal: Soft, nondistended, protuberant, nontender.  There are no masses palpable.  Extremities: no edema Neurological: Alert  and oriented to person place and time. Skin: Skin is warm and dry. No rashes noted. Psychiatric: Normal mood and affect. Behavior is normal.   ASSESSMENT: 57 y.o. female here for assessment of the following  1. Chronic diarrhea   2. Chronic nausea   3. Early satiety    4. Gastroesophageal reflux disease, unspecified whether esophagitis present   5. Chronic narcotic use   6. Metabolic dysfunction-associated steatotic liver disease (MASLD)    Multiple issues we addressed today.  Chronic diarrhea ongoing for a few years now.  Colonoscopy with biopsies ruled out microscopic colitis.  Bentyl really has not provided long-term benefit, nor has Pepto-Bismol.  Sounds like she has not really taken anything else for this yet.  She is postcholecystectomy and we discussed that could potentially be related to some of her postprandial loose stools.  Discussed options for workup and evaluation.  I like to start her on Colestid 1 g twice daily for 1 month trial to see if that will help.  She will need to speak with her pharmacist to make sure this does not influence absorption of other medications that she takes.  I counseled her on a low FODMAP diet we will try to see if she notices any food triggers as well that could be related.  For her abdominal cramping gave her a sample of IBgard to use as needed.  She is due for surveillance colonoscopy for her history of polyps.  I do not feel that we are going to find a cause for her loose stools with this but in light of her polyp history surveillance colonoscopy is recommended.  I discussed risk benefits of the procedure and anesthesia and she wants to proceed.  Will also have her go to the lab for fecal pancreatic elastase to make sure normal.  In regards to her chronic nausea, suspect this could be in part related to her chronic narcotic use and counseled her on that.  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  That being said she has not had an EGD in over 15 years, has chronic reflux.  I offered her an EGD to be done at the same time as her colonoscopy to screen for Barrett's and evaluate her upper tract symptoms.  She agrees with this.  She should take her Zofran daily to twice daily as this works to  control her nausea when she takes it.  Otherwise recommend she follow-up with her primary care and perhaps seek evaluation by chronic pain management and see if we can get her off narcotics in light of these bowel symptoms.  Otherwise reviewed fatty liver with her and counseled her on the risks of fibrotic change and cirrhosis over time.  Her ALT was minimally elevated on the last check.  I will repeat that now, if remains elevated will pursue further workup for other chronic liver diseases but suspect this may be related to fatty liver.  Counseled her to minimize her alcohol use and work on weight loss.  This will need to be monitored over time.  She agrees with the plan, further recommendations pending the results and her course   PLAN: - schedule EGD and colonoscopy at the [facility] - history of colon polyps, nausea / early satiety / GERD rule out BE - lab for pancreatic fecal elastase - start trial of colestid 1gm BID - talk with pharmacy about what she should avoid taking with it (synthroid) - handout on low FODMAP diet provided -  trial of IB gard - samples provided to use PRN - take Zofran BID for nausea scheduled - would minimize narcotic use, ask PCP about pain management and see if she can come off narcotics - counseled on fatty liver - lab for LFTs - if persistent transaminitis will pursue further serologic workup - counseled on weight loss, minimize EtOH - will need follow up in 6-12 month for fatty liver. Consider elastography in the next year  Harlin Rain, MD [facility]  CC: Lianne Moris, PA-C

## 2022-12-05 NOTE — Patient Instructions (Addendum)
If your blood pressure at your visit was 140/90 or greater, please contact your primary care physician to follow up on this. ______________________________________________________  If you are age 59 or older, your body mass index should be between 23-30. Your Body mass index is 38.94 kg/m. If this is out of the aforementioned range listed, please consider follow up with your Primary Care Provider.  If you are age 45 or younger, your body mass index should be between 19-25. Your Body mass index is 38.94 kg/m. If this is out of the aformentioned range listed, please consider follow up with your Primary Care Provider.  ________________________________________________________  The [facility] providers would like to encourage you to use [facility] to communicate with providers for non-urgent requests or questions.  Due to long hold times on the telephone, sending your provider a message by [facility] may be a faster and more efficient way to get a response.  Please allow 48 business hours for a response.  Please remember that this is for non-urgent requests.  _______________________________________________________  Due to recent changes in healthcare laws, you may see the results of your imaging and laboratory studies on MyChart before your provider has had a chance to review them.  We understand that in some cases there may be results that are confusing or concerning to you. Not all laboratory results come back in the same time frame and the provider may be waiting for multiple results in order to interpret others.  Please give [place] 48 hours in order for your provider to thoroughly review all the results before contacting the office for clarification of your results.   You have been scheduled for an endoscopy and colonoscopy. Please follow the written instructions given to you at your visit today.  Please pick up your prep supplies at the pharmacy within the next 1-3 days.  If you use inhalers (even only as  needed), please bring them with you on the day of your procedure.  DO NOT TAKE 7 DAYS PRIOR TO TEST- Trulicity (dulaglutide) Ozempic, Wegovy (semaglutide) Mounjaro (tirzepatide) Bydureon Bcise (exanatide extended release)  DO NOT TAKE 1 DAY PRIOR TO YOUR TEST Rybelsus (semaglutide) Adlyxin (lixisenatide) Victoza (liraglutide) Byetta (exanatide) ___________________________________________________________________________  We have sent the following medications to your pharmacy for you to pick up at your convenience: Colestid 1 g: Take twice daily  (speak to pharmacy about when this medication should be taken in relation to your other medications)  Take Zofran twice a day -  Take on schedule  We are giving you a Low-FODMAP diet handout today. FODMAPs are short-chain carbohydrates (sugars) that are highly fermentable, which means that they go through chemical changes in the GI system, and are poorly absorbed during digestion. When FODMAPs reach the colon (large intestine), bacteria ferment these sugars, turning them into gas and chemicals. This stretches the walls of the colon, causing abdominal bloating, distension, cramping, pain, and/or changes in bowel habits in many patients with IBS. FODMAPs are not unhealthy or harmful, but may exacerbate GI symptoms in those with sensitive GI tracts.  Please go to the lab in the basement of our building to have lab work done as you leave today. Hit "B" for basement when you get on the elevator.  When the doors open the lab is on your left.  We will call you with the results. Thank you.  We have given you samples of the following medication to take: IBgard - take as directed as needed  Thank you for entrusting me with  your care and for choosing [facility], Dr. Ileene Patrick

## 2022-12-07 ENCOUNTER — Telehealth: Payer: Self-pay | Admitting: Gastroenterology

## 2022-12-07 MED ORDER — NA SULFATE-K SULFATE-MG SULF 17.5-3.13-1.6 GM/177ML PO SOLN: 1.0000 | Freq: Once | ORAL | 0 refills | Status: AC

## 2022-12-07 NOTE — Telephone Encounter (Signed)
Prescription for Suprep has been sent to the pharmacy on file.

## 2022-12-07 NOTE — Telephone Encounter (Signed)
Patient called stated she did not receive her prep medication please send to the pharmacy on file.

## 2022-12-12 DIAGNOSIS — E782 Mixed hyperlipidemia: Secondary | ICD-10-CM | POA: Diagnosis not present

## 2022-12-12 DIAGNOSIS — E039 Hypothyroidism, unspecified: Secondary | ICD-10-CM | POA: Diagnosis not present

## 2022-12-12 DIAGNOSIS — R739 Hyperglycemia, unspecified: Secondary | ICD-10-CM | POA: Diagnosis not present

## 2022-12-12 DIAGNOSIS — M542 Cervicalgia: Secondary | ICD-10-CM | POA: Diagnosis not present

## 2022-12-12 DIAGNOSIS — R5383 Other fatigue: Secondary | ICD-10-CM | POA: Diagnosis not present

## 2022-12-12 DIAGNOSIS — S39012A Strain of muscle, fascia and tendon of lower back, initial encounter: Secondary | ICD-10-CM | POA: Diagnosis not present

## 2022-12-12 DIAGNOSIS — F411 Generalized anxiety disorder: Secondary | ICD-10-CM | POA: Diagnosis not present

## 2022-12-12 DIAGNOSIS — M545 Low back pain, unspecified: Secondary | ICD-10-CM | POA: Diagnosis not present

## 2022-12-12 DIAGNOSIS — E7849 Other hyperlipidemia: Secondary | ICD-10-CM | POA: Diagnosis not present

## 2022-12-12 DIAGNOSIS — I10 Essential (primary) hypertension: Secondary | ICD-10-CM | POA: Diagnosis not present

## 2022-12-12 DIAGNOSIS — K219 Gastro-esophageal reflux disease without esophagitis: Secondary | ICD-10-CM | POA: Diagnosis not present

## 2022-12-18 DIAGNOSIS — M5030 Other cervical disc degeneration, unspecified cervical region: Secondary | ICD-10-CM | POA: Diagnosis not present

## 2022-12-18 DIAGNOSIS — M5136 Other intervertebral disc degeneration, lumbar region: Secondary | ICD-10-CM | POA: Diagnosis not present

## 2023-01-04 DIAGNOSIS — M5116 Intervertebral disc disorders with radiculopathy, lumbar region: Secondary | ICD-10-CM | POA: Diagnosis not present

## 2023-01-04 DIAGNOSIS — M5416 Radiculopathy, lumbar region: Secondary | ICD-10-CM | POA: Diagnosis not present

## 2023-01-08 ENCOUNTER — Ambulatory Visit: Payer: Medicare Other | Admitting: Nurse Practitioner

## 2023-01-15 ENCOUNTER — Encounter: Payer: Self-pay | Admitting: Gastroenterology

## 2023-01-15 ENCOUNTER — Ambulatory Visit: Payer: Medicare HMO | Admitting: Gastroenterology

## 2023-01-15 VITALS — BP 161/91 | HR 72 | Temp 98.9°F | Resp 13 | Ht 65.0 in | Wt 234.0 lb

## 2023-01-15 DIAGNOSIS — K529 Noninfective gastroenteritis and colitis, unspecified: Secondary | ICD-10-CM | POA: Diagnosis not present

## 2023-01-15 DIAGNOSIS — K219 Gastro-esophageal reflux disease without esophagitis: Secondary | ICD-10-CM | POA: Diagnosis not present

## 2023-01-15 DIAGNOSIS — K635 Polyp of colon: Secondary | ICD-10-CM

## 2023-01-15 DIAGNOSIS — D122 Benign neoplasm of ascending colon: Secondary | ICD-10-CM

## 2023-01-15 DIAGNOSIS — Z860100 Personal history of colon polyps, unspecified: Secondary | ICD-10-CM

## 2023-01-15 DIAGNOSIS — R110 Nausea: Secondary | ICD-10-CM | POA: Diagnosis not present

## 2023-01-15 DIAGNOSIS — Z860101 Personal history of adenomatous and serrated colon polyps: Secondary | ICD-10-CM | POA: Diagnosis not present

## 2023-01-15 DIAGNOSIS — Z09 Encounter for follow-up examination after completed treatment for conditions other than malignant neoplasm: Secondary | ICD-10-CM | POA: Diagnosis not present

## 2023-01-15 DIAGNOSIS — R6881 Early satiety: Secondary | ICD-10-CM | POA: Diagnosis not present

## 2023-01-15 DIAGNOSIS — J449 Chronic obstructive pulmonary disease, unspecified: Secondary | ICD-10-CM | POA: Diagnosis not present

## 2023-01-15 DIAGNOSIS — D125 Benign neoplasm of sigmoid colon: Secondary | ICD-10-CM

## 2023-01-15 DIAGNOSIS — F419 Anxiety disorder, unspecified: Secondary | ICD-10-CM | POA: Diagnosis not present

## 2023-01-15 DIAGNOSIS — K319 Disease of stomach and duodenum, unspecified: Secondary | ICD-10-CM

## 2023-01-15 DIAGNOSIS — F32A Depression, unspecified: Secondary | ICD-10-CM | POA: Diagnosis not present

## 2023-01-15 MED ORDER — SODIUM CHLORIDE 0.9 % IV SOLN: 500.0000 mL | Freq: Once | INTRAVENOUS | Status: DC

## 2023-01-15 MED ORDER — METOCLOPRAMIDE HCL 5 MG PO TABS: 5.0000 mg | ORAL_TABLET | Freq: Three times a day (TID) | ORAL | 0 refills | Status: AC | PRN

## 2023-01-15 NOTE — Progress Notes (Signed)
Pt's states no medical or surgical changes since previsit or office visit. 

## 2023-01-15 NOTE — Patient Instructions (Addendum)
Please read handouts provided. Continue present medications. Await pathology results. Try to minimize narcotics if possible. Reglan 5 mg before meals as needed. No more than 3/day.  YOU HAD AN ENDOSCOPIC PROCEDURE TODAY AT THE [facility]:   Refer to the procedure report that was given to you for any specific questions about what was found during the examination.  If the procedure report does not answer your questions, please call your gastroenterologist to clarify.  If you requested that your care partner not be given the details of your procedure findings, then the procedure report has been included in a sealed envelope for you to review at your convenience later.  YOU SHOULD EXPECT: Some feelings of bloating in the abdomen. Passage of more gas than usual.  Walking can help get rid of the air that was put into your GI tract during the procedure and reduce the bloating. If you had a lower endoscopy (such as a colonoscopy or flexible sigmoidoscopy) you may notice spotting of blood in your stool or on the toilet paper. If you underwent a bowel prep for your procedure, you may not have a normal bowel movement for a few days.  Please Note:  You might notice some irritation and congestion in your nose or some drainage.  This is from the oxygen used during your procedure.  There is no need for concern and it should clear up in a day or so.  SYMPTOMS TO REPORT IMMEDIATELY:  Following lower endoscopy (colonoscopy or flexible sigmoidoscopy):  Excessive amounts of blood in the stool  Significant tenderness or worsening of abdominal pains  Swelling of the abdomen that is new, acute  Fever of 100F or higher  Following upper endoscopy (EGD)  Vomiting of blood or coffee ground material  New chest pain or pain under the shoulder blades  Painful or persistently difficult swallowing  New shortness of breath  Fever of 100F or higher  Black, tarry-looking stools  For urgent or emergent  issues, a gastroenterologist can be reached at any hour by calling [phone]. Do not use MyChart messaging for urgent concerns.    DIET:  We do recommend a small meal at first, but then you may proceed to your regular diet.  Drink plenty of fluids but you should avoid alcoholic beverages for 24 hours.  ACTIVITY:  You should plan to take it easy for the rest of today and you should NOT DRIVE or use heavy machinery until tomorrow (because of the sedation medicines used during the test).    FOLLOW UP: Our staff will call the number listed on your records the next business day following your procedure.  We will call around 7:15- 8:00 am to check on you and address any questions or concerns that you may have regarding the information given to you following your procedure. If we do not reach you, we will leave a message.     If any biopsies were taken you will be contacted by phone or by letter within the next 1-3 weeks.  Please call us at [phone] if you have not heard about the biopsies in 3 weeks.    SIGNATURES/CONFIDENTIALITY: You and/or your care partner have signed paperwork which will be entered into your electronic medical record.  These signatures attest to the fact that that the information above on your After Visit Summary has been reviewed and is understood.  Full responsibility of the confidentiality of this discharge information lies with you and/or your care-partner.

## 2023-01-15 NOTE — Progress Notes (Signed)
[facility] History and Physical   Primary Care Physician:  Lianne Moris, PA-C   Reason for Procedure:   History of colon polyps, GERD, nausea, early satiety  Plan:    EGD and colonoscopy     HPI: Katherine Hayden is a 57 y.o. female  here for EGD and colonoscopy to evaluate issues as outlined. See office note from August for full details. On chronic nexium for GERD, has ongoing nausea, early satiety in the setting of chronic narcotic use. Also with chronic diarrhea, prior colonoscopy ruled out microscopic colitis. She has had 4 adenomas removed in 06/2019 - colonoscopy for surveillance purposes . Started on colestid since last office visit which hasn't helped much but IB gard has.  Otherwise feels well without any cardiopulmonary symptoms.   I have discussed risks / benefits of anesthesia and endoscopic procedure with Zola Button and they wish to proceed with the exams as outlined today.    Past Medical History:  Diagnosis Date   Anxiety    Arthritis    Asthma    Back pain    Cervicogenic headache 08/20/2016   Chronic headaches    Chronic pain    COPD (chronic obstructive pulmonary disease) (HCC)    Depression    Emphysema    Emphysema of lung (HCC)    Fatty liver    GERD (gastroesophageal reflux disease)    High cholesterol    Hypertension    Hypothyroidism    IBS (irritable bowel syndrome)    Joint pain    RLS (restless legs syndrome)    Thyroid disease     Past Surgical History:  Procedure Laterality Date   ANTERIOR CERVICAL DECOMP/DISCECTOMY FUSION N/A 12/17/2018   Procedure: ANTERIOR CERVICAL DECOMPRESSION/DISCECTOMY FUSION CERVIAL FIVE THROUGH SIX;  Surgeon: Venita Lick, MD;  Location: [facility];  Service: Orthopedics;  Laterality: N/A;  2.5 hrs   APPENDECTOMY     BACK SURGERY     x4   BIOPSY  06/12/2019   Procedure: BIOPSY;  Surgeon: Malissa Hippo, MD;  Location: [facility];  Service: Endoscopy;;  random, sigmoid   BREAST  REDUCTION SURGERY Bilateral 08/06/2019   Procedure: BILATERAL MAMMARY REDUCTION (BREAST) WITH LIPOSUCTION;  Surgeon: Peggye Form, DO;  Location: [facility];  Service: Plastics;  Laterality: Bilateral;  3 hours   CHOLECYSTECTOMY  2010   COLONOSCOPY W/ POLYPECTOMY  07/2013   1 polyp removal   COLONOSCOPY WITH PROPOFOL N/A 06/12/2019   Procedure: COLONOSCOPY WITH PROPOFOL;  Surgeon: Malissa Hippo, MD;  Location: [facility];  Service: Endoscopy;  Laterality: N/A;  825   CYST REMOVAL HAND  2015   EXCISION MASS UPPER EXTREMETIES Left 02/25/2017   Procedure: EXCISION MASS UPPER EXTREMETIES, LEFT PALM;  Surgeon: Betha Loa, MD;  Location: [facility];  Service: Orthopedics;  Laterality: Left;   POLYPECTOMY  06/12/2019   Procedure: POLYPECTOMY;  Surgeon: Malissa Hippo, MD;  Location: [facility];  Service: Endoscopy;;  ascending;hepatic flexurex2;splenic flexure    Prior to Admission medications   Medication Sig Start Date End Date Taking? Authorizing Provider  ALPRAZolam Prudy Feeler) 1 MG tablet Take 1 mg by mouth 4 (four) times daily as needed for anxiety.    Yes [provider]  aspirin EC 81 MG tablet Take 1 tablet (81 mg total) by mouth daily. Swallow whole. 10/19/21  Yes Branch, Dorothe Pea, MD  atorvastatin (LIPITOR) 20 MG tablet Take 20 mg by mouth at bedtime.  Yes [provider]  baclofen (LIORESAL) 10 MG tablet Take 10 mg by mouth in the morning and at bedtime. 05/29/19  Yes [provider]  colestipol (COLESTID) 1 g tablet Take 2 tablets (2 g total) by mouth 2 (two) times daily. 12/05/22  Yes Madoc Holquin, [name], MD  dicyclomine (BENTYL) 10 MG capsule TAKE ONE CAPSULE BY MOUTH THREE TIMES DAILY AS NEEDED FOR SPASMS 04/04/22  Yes Carlan, Chelsea L, NP  escitalopram (LEXAPRO) 20 MG tablet Take 20 mg by mouth daily. 05/20/19  Yes [provider]  esomeprazole (NEXIUM) 40 MG capsule Take 1 capsule (40 mg total) by mouth  daily before breakfast. 04/25/21  Yes Rehman, Joline Maxcy, MD  HYDROcodone-acetaminophen (NORCO) 10-325 MG tablet Take 1 tablet by mouth every 6 (six) hours as needed (pain.).    Yes [provider]  losartan (COZAAR) 50 MG tablet Take 50 mg by mouth daily.   Yes [provider]  montelukast (SINGULAIR) 10 MG tablet Take 10 mg by mouth at bedtime.   Yes [provider]  Peppermint Oil (IBGARD) 90 MG CPCR Take as directed 12/05/22  Yes Jocelyn Nold, [name], MD  PROAIR HFA 108 (90 BASE) MCG/ACT inhaler Inhale 2 puffs into the lungs every 4 (four) hours as needed for wheezing or shortness of breath.  06/19/13  Yes [provider]  propranolol (INDERAL) 80 MG tablet Take 80 mg by mouth daily.   Yes [provider]  QUEtiapine (SEROQUEL) 300 MG tablet 1 tablet in the morning and 1.5 tablets at bedtime   Yes [provider]  rOPINIRole (REQUIP) 4 MG tablet Take 4 mg by mouth at bedtime. 05/06/19  Yes [provider]  SYNTHROID 150 MCG tablet Take 1 tablet (150 mcg total) by mouth daily before breakfast. Patient taking differently: Take 200 mcg by mouth daily before breakfast. 07/06/19  Yes Nida, Denman George, MD  traZODone (DESYREL) 100 MG tablet Take 100 mg by mouth at bedtime as needed.   Yes [provider]  ondansetron (ZOFRAN) 4 MG tablet Take 1 tablet (4 mg total) by mouth every 8 (eight) hours as needed for nausea or vomiting. Patient not taking: Reported on 01/15/2023 07/14/19   Scheeler, Kermit Balo, PA-C    Current Outpatient Medications  Medication Sig Dispense Refill   ALPRAZolam (XANAX) 1 MG tablet Take 1 mg by mouth 4 (four) times daily as needed for anxiety.      aspirin EC 81 MG tablet Take 1 tablet (81 mg total) by mouth daily. Swallow whole.     atorvastatin (LIPITOR) 20 MG tablet Take 20 mg by mouth at bedtime.     baclofen (LIORESAL) 10 MG tablet Take 10 mg by mouth in the morning and at bedtime.     colestipol  (COLESTID) 1 g tablet Take 2 tablets (2 g total) by mouth 2 (two) times daily. 60 tablet 1   dicyclomine (BENTYL) 10 MG capsule TAKE ONE CAPSULE BY MOUTH THREE TIMES DAILY AS NEEDED FOR SPASMS 90 capsule 2   escitalopram (LEXAPRO) 20 MG tablet Take 20 mg by mouth daily.     esomeprazole (NEXIUM) 40 MG capsule Take 1 capsule (40 mg total) by mouth daily before breakfast. 90 capsule 3   HYDROcodone-acetaminophen (NORCO) 10-325 MG tablet Take 1 tablet by mouth every 6 (six) hours as needed (pain.).      losartan (COZAAR) 50 MG tablet Take 50 mg by mouth daily.     montelukast (SINGULAIR) 10 MG tablet Take 10  mg by mouth at bedtime.     Peppermint Oil (IBGARD) 90 MG CPCR Take as directed     PROAIR HFA 108 (90 BASE) MCG/ACT inhaler Inhale 2 puffs into the lungs every 4 (four) hours as needed for wheezing or shortness of breath.      propranolol (INDERAL) 80 MG tablet Take 80 mg by mouth daily.     QUEtiapine (SEROQUEL) 300 MG tablet 1 tablet in the morning and 1.5 tablets at bedtime     rOPINIRole (REQUIP) 4 MG tablet Take 4 mg by mouth at bedtime.     SYNTHROID 150 MCG tablet Take 1 tablet (150 mcg total) by mouth daily before breakfast. (Patient taking differently: Take 200 mcg by mouth daily before breakfast.) 90 tablet 1   traZODone (DESYREL) 100 MG tablet Take 100 mg by mouth at bedtime as needed.     ondansetron (ZOFRAN) 4 MG tablet Take 1 tablet (4 mg total) by mouth every 8 (eight) hours as needed for nausea or vomiting. (Patient not taking: Reported on 01/15/2023) 20 tablet 0   Current Facility-Administered Medications  Medication Dose Route Frequency Provider Last Rate Last Admin   0.9 %  sodium chloride infusion  500 mL Intravenous Once Makih Stefanko, [name], MD        Allergies as of 01/15/2023 - Review Complete 01/15/2023  Allergen Reaction Noted   Pneumococcal vaccines Itching and Other (See Comments) 03/04/2013   Amoxicillin Hives 11/01/2006   Codeine Nausea And Vomiting 11/01/2006    Hydromorphone hcl Nausea And Vomiting 11/01/2006   Sulfonamide derivatives Hives 11/01/2006   Adhesive [tape]  08/06/2019   Morphine and codeine Hives 02/19/2017    Family History  Problem Relation Age of Onset   Hypertension Mother    Depression Mother    Anxiety disorder Mother    Hypertension Father    Heart disease Maternal Grandfather    Heart disease Paternal Grandmother    Colon cancer Neg Hx    Stomach cancer Neg Hx    Esophageal cancer Neg Hx     Social History   Socioeconomic History   Marital status: Divorced    Spouse name: Not on file   Number of children: 0   Years of education: Not on file   Highest education level: Not on file  Occupational History   Occupation: Disabled   Occupation: disabled  Tobacco Use   Smoking status: Former    Current packs/day: 0.00    Types: Cigarettes    Quit date: 06/15/2011    Years since quitting: 11.5   Smokeless tobacco: Never  Vaping Use   Vaping status: Never Used  Substance and Sexual Activity   Alcohol use: Yes    Comment: rare   Drug use: No   Sexual activity: Not on file  Other Topics Concern   Not on file  Social History Narrative   Not on file   Social Determinants of Health   Financial Resource Strain: Not on file  Food Insecurity: Not on file  Transportation Needs: Not on file  Physical Activity: Not on file  Stress: Not on file  Social Connections: Not on file  Intimate Partner Violence: Not on file    Review of Systems: All other review of systems negative except as mentioned in the HPI.  Physical Exam: Vital signs BP (!) 153/103   Pulse 68   Temp 98.9 F (37.2 C) (Skin)   Ht 5\' 5"  (1.651 m)   Wt 234 lb (106.1 kg)  LMP 12/15/2013 (Approximate)   SpO2 96%   BMI 38.94 kg/m   General:   Alert,  Well-developed, pleasant and cooperative in NAD Lungs:  Clear throughout to auscultation.   Heart:  Regular rate and rhythm Abdomen:  Soft, nontender and nondistended.   Neuro/Psych:   Alert and cooperative. Normal mood and affect. A and O x 3  Harlin Rain, MD [facility]

## 2023-01-15 NOTE — Op Note (Signed)
[facility] Patient Name: Katherine Hayden Procedure Date: 01/15/2023 2:53 PM MRN: [id number] Endoscopist: Viviann Spare P. Adela Lank , MD, [id number] Age: 57 Referring MD:  Date of Birth: Jan 08, 1966 Gender: Female Account #: [id number] Procedure:                Upper GI endoscopy Indications:              history of gastro-esophageal reflux disease, Early                            satiety, Nausea with vomiting - on chronic nexium,                            also takes narcotics for chronic pain Medicines:                Monitored Anesthesia Care Procedure:                Pre-Anesthesia Assessment:                           - Prior to the procedure, a History and Physical                            was performed, and patient medications and                            allergies were reviewed. The patient's tolerance of                            previous anesthesia was also reviewed. The risks                            and benefits of the procedure and the sedation                            options and risks were discussed with the patient.                            All questions were answered, and informed consent                            was obtained. Prior Anticoagulants: The patient has                            taken no anticoagulant or antiplatelet agents. ASA                            Grade Assessment: II - A patient with mild systemic                            disease. After reviewing the risks and benefits,                            the patient was deemed in satisfactory condition to  undergo the procedure.                           After obtaining informed consent, the endoscope was                            passed under direct vision. Throughout the                            procedure, the patient's blood pressure, pulse, and                            oxygen saturations were monitored continuously. The                             Olympus Scope [id number] was introduced through the                            mouth, and advanced to the second part of duodenum.                            The upper GI endoscopy was accomplished without                            difficulty. The patient tolerated the procedure                            well. Scope In: Scope Out: Findings:                 Esophagogastric landmarks were identified: the                            Z-line was found at 37 cm, the gastroesophageal                            junction was found at 38 cm and the upper extent of                            the gastric folds was found at 37 cm from the                            incisors. Z line was slightly irregular but did not                            meet criteria for Barrett's.                           The exam of the esophagus was otherwise normal.                           The entire examined stomach was normal. Biopsies                            were  taken with a cold forceps for Helicobacter                            pylori testing.                           The examined duodenum was normal. Biopsies for                            histology were taken with a cold forceps for                            evaluation of celiac disease. Complications:            No immediate complications. Estimated blood loss:                            Minimal. Estimated Blood Loss:     Estimated blood loss was minimal. Impression:               - Esophagogastric landmarks identified.                           - Normal esophagus otherwise.                           - Normal stomach. Biopsied.                           - Normal examined duodenum. Biopsied.                           Normal anatomy, no concerning pathology on this                            exam. Patient could have functional disorder in the                            setting of chronic narcotics use. Recommendation:           - Patient has a contact number  available for                            emergencies. The signs and symptoms of potential                            delayed complications were discussed with the                            patient. Return to normal activities tomorrow.                            Written discharge instructions were provided to the                            patient.                           -  Resume previous diet.                           - Continue present medications.                           - Await pathology results.                           - Try to minimize narcotics if possible                           - Consideration for empiric low dose Reglan, will                            discuss with the patient [name]. [name], MD 01/15/2023 3:38:53 PM This report has been signed electronically.

## 2023-01-15 NOTE — Progress Notes (Signed)
Called to room to assist during endoscopic procedure.  Patient ID and intended procedure confirmed with present staff. Received instructions for my participation in the procedure from the performing physician.  

## 2023-01-15 NOTE — Op Note (Signed)
[facility] Patient Name: Katherine Hayden Procedure Date: 01/15/2023 2:53 PM MRN: [id number] Endoscopist: Viviann Spare P. Adela Lank , MD, [id number] Age: 57 Referring MD:  Date of Birth: 16-Mar-1966 Gender: Female Account #: [id number] Procedure:                Colonoscopy Indications:              High risk colon cancer surveillance: Personal                            history of colonic polyps - 4 adenomas removed                            06/2019, also with chronic loose stools - trial of                            colestid has not helped. IB gard has helped some of                            her lower abdominal symptoms. Medicines:                Monitored Anesthesia Care Procedure:                Pre-Anesthesia Assessment:                           - Prior to the procedure, a History and Physical                            was performed, and patient medications and                            allergies were reviewed. The patient's tolerance of                            previous anesthesia was also reviewed. The risks                            and benefits of the procedure and the sedation                            options and risks were discussed with the patient.                            All questions were answered, and informed consent                            was obtained. Prior Anticoagulants: The patient has                            taken no anticoagulant or antiplatelet agents. ASA                            Grade Assessment: II - A patient with mild systemic  disease. After reviewing the risks and benefits,                            the patient was deemed in satisfactory condition to                            undergo the procedure.                           After obtaining informed consent, the colonoscope                            was passed under direct vision. Throughout the                            procedure, the patient's  blood pressure, pulse, and                            oxygen saturations were monitored continuously. The                            CF [id number] #[id number] was introduced through the anus                            and advanced to the the cecum, identified by                            appendiceal orifice and ileocecal valve. The                            colonoscopy was performed without difficulty. The                            patient tolerated the procedure well. The quality                            of the bowel preparation was adequate. The                            ileocecal valve, appendiceal orifice, and rectum                            were photographed. Scope In: 3:11:05 PM Scope Out: 3:28:26 PM Scope Withdrawal Time: 0 hours 10 minutes 46 seconds  Total Procedure Duration: 0 hours 17 minutes 21 seconds  Findings:                 The perianal and digital rectal examinations were                            normal.                           A 3 mm polyp was found in the ascending colon. The  polyp was flat. The polyp was removed with a cold                            biopsy forceps. Resection and retrieval were                            complete.                           A diminutive polyp was found in the sigmoid colon.                            The polyp was sessile. The polyp was removed with a                            cold biopsy forceps. Resection and retrieval were                            complete.                           A few small-mouthed diverticula were found in the                            sigmoid colon.                           Internal hemorrhoids were found during retroflexion.                           The exam was otherwise without abnormality. Of                            note, right colon was tortous, ileal intubation not                            successful.                           Biopsies for histology were taken  with a cold                            forceps from the right colon, left colon and                            transverse colon for evaluation of microscopic                            colitis. Complications:            No immediate complications. Estimated blood loss:                            Minimal. Estimated Blood Loss:     Estimated blood loss was minimal. Impression:               -  One 3 mm polyp in the ascending colon, removed                            with a cold biopsy forceps. Resected and retrieved.                           - One diminutive polyp in the sigmoid colon,                            removed with a cold biopsy forceps. Resected and                            retrieved.                           - Diverticulosis in the sigmoid colon.                           - Internal hemorrhoids.                           - The examination was otherwise normal.                           - Biopsies were taken with a cold forceps from the                            right colon, left colon and transverse colon for                            evaluation of microscopic colitis. Recommendation:           - Patient has a contact number available for                            emergencies. The signs and symptoms of potential                            delayed complications were discussed with the                            patient. Return to normal activities tomorrow.                            Written discharge instructions were provided to the                            patient.                           - Resume previous diet.                           - Continue present medications.                           -  Await pathology results with further                            recommendations on long term management of loose                            stools. Viviann Spare P. Marjon Doxtater, MD 01/15/2023 3:34:11 PM This report has been signed electronically.

## 2023-01-15 NOTE — Progress Notes (Signed)
Sedate, gd SR, tolerated procedure well, VSS, report to RN 

## 2023-01-16 ENCOUNTER — Telehealth: Payer: Self-pay

## 2023-01-16 NOTE — Telephone Encounter (Signed)
Per patient request do not leave message.

## 2023-01-18 DIAGNOSIS — M5412 Radiculopathy, cervical region: Secondary | ICD-10-CM | POA: Diagnosis not present

## 2023-01-18 DIAGNOSIS — M5013 Cervical disc disorder with radiculopathy, cervicothoracic region: Secondary | ICD-10-CM | POA: Diagnosis not present

## 2023-01-18 LAB — SURGICAL PATHOLOGY

## 2023-02-05 DIAGNOSIS — R030 Elevated blood-pressure reading, without diagnosis of hypertension: Secondary | ICD-10-CM | POA: Diagnosis not present

## 2023-02-05 DIAGNOSIS — Z20828 Contact with and (suspected) exposure to other viral communicable diseases: Secondary | ICD-10-CM | POA: Diagnosis not present

## 2023-02-05 DIAGNOSIS — J209 Acute bronchitis, unspecified: Secondary | ICD-10-CM | POA: Diagnosis not present

## 2023-02-05 DIAGNOSIS — Z6837 Body mass index (BMI) 37.0-37.9, adult: Secondary | ICD-10-CM | POA: Diagnosis not present

## 2023-02-20 DIAGNOSIS — Z981 Arthrodesis status: Secondary | ICD-10-CM | POA: Diagnosis not present

## 2023-02-20 DIAGNOSIS — M5030 Other cervical disc degeneration, unspecified cervical region: Secondary | ICD-10-CM | POA: Diagnosis not present

## 2023-02-20 DIAGNOSIS — M5416 Radiculopathy, lumbar region: Secondary | ICD-10-CM | POA: Diagnosis not present

## 2023-02-20 DIAGNOSIS — M961 Postlaminectomy syndrome, not elsewhere classified: Secondary | ICD-10-CM | POA: Diagnosis not present

## 2023-03-15 DIAGNOSIS — I10 Essential (primary) hypertension: Secondary | ICD-10-CM | POA: Diagnosis not present

## 2023-03-15 DIAGNOSIS — E039 Hypothyroidism, unspecified: Secondary | ICD-10-CM | POA: Diagnosis not present

## 2023-03-15 DIAGNOSIS — M542 Cervicalgia: Secondary | ICD-10-CM | POA: Diagnosis not present

## 2023-03-15 DIAGNOSIS — S39012A Strain of muscle, fascia and tendon of lower back, initial encounter: Secondary | ICD-10-CM | POA: Diagnosis not present

## 2023-03-15 DIAGNOSIS — F411 Generalized anxiety disorder: Secondary | ICD-10-CM | POA: Diagnosis not present

## 2023-03-15 DIAGNOSIS — E7849 Other hyperlipidemia: Secondary | ICD-10-CM | POA: Diagnosis not present

## 2023-03-15 DIAGNOSIS — K219 Gastro-esophageal reflux disease without esophagitis: Secondary | ICD-10-CM | POA: Diagnosis not present

## 2023-03-15 DIAGNOSIS — R739 Hyperglycemia, unspecified: Secondary | ICD-10-CM | POA: Diagnosis not present

## 2023-03-15 DIAGNOSIS — R5383 Other fatigue: Secondary | ICD-10-CM | POA: Diagnosis not present

## 2023-04-23 DIAGNOSIS — M5417 Radiculopathy, lumbosacral region: Secondary | ICD-10-CM | POA: Diagnosis not present

## 2023-04-23 DIAGNOSIS — M5412 Radiculopathy, cervical region: Secondary | ICD-10-CM | POA: Diagnosis not present

## 2023-04-26 DIAGNOSIS — M4726 Other spondylosis with radiculopathy, lumbar region: Secondary | ICD-10-CM | POA: Diagnosis not present

## 2023-04-26 DIAGNOSIS — Z981 Arthrodesis status: Secondary | ICD-10-CM | POA: Diagnosis not present

## 2023-04-26 DIAGNOSIS — M545 Low back pain, unspecified: Secondary | ICD-10-CM | POA: Diagnosis not present

## 2023-04-26 DIAGNOSIS — I700 Atherosclerosis of aorta: Secondary | ICD-10-CM | POA: Diagnosis not present

## 2023-04-26 DIAGNOSIS — M542 Cervicalgia: Secondary | ICD-10-CM | POA: Diagnosis not present

## 2023-04-26 DIAGNOSIS — M5116 Intervertebral disc disorders with radiculopathy, lumbar region: Secondary | ICD-10-CM | POA: Diagnosis not present

## 2023-04-26 DIAGNOSIS — M4722 Other spondylosis with radiculopathy, cervical region: Secondary | ICD-10-CM | POA: Diagnosis not present

## 2023-04-26 DIAGNOSIS — M5412 Radiculopathy, cervical region: Secondary | ICD-10-CM | POA: Diagnosis not present

## 2023-04-26 DIAGNOSIS — Z4789 Encounter for other orthopedic aftercare: Secondary | ICD-10-CM | POA: Diagnosis not present

## 2023-04-26 DIAGNOSIS — M48061 Spinal stenosis, lumbar region without neurogenic claudication: Secondary | ICD-10-CM | POA: Diagnosis not present

## 2023-04-26 DIAGNOSIS — M9973 Connective tissue and disc stenosis of intervertebral foramina of lumbar region: Secondary | ICD-10-CM | POA: Diagnosis not present

## 2023-04-26 DIAGNOSIS — M5417 Radiculopathy, lumbosacral region: Secondary | ICD-10-CM | POA: Diagnosis not present

## 2023-05-02 DIAGNOSIS — M48061 Spinal stenosis, lumbar region without neurogenic claudication: Secondary | ICD-10-CM | POA: Diagnosis not present

## 2023-05-02 DIAGNOSIS — G8929 Other chronic pain: Secondary | ICD-10-CM | POA: Diagnosis not present

## 2023-05-02 DIAGNOSIS — M4807 Spinal stenosis, lumbosacral region: Secondary | ICD-10-CM | POA: Diagnosis not present

## 2023-05-02 DIAGNOSIS — M5021 Other cervical disc displacement,  high cervical region: Secondary | ICD-10-CM | POA: Diagnosis not present

## 2023-05-02 DIAGNOSIS — M50221 Other cervical disc displacement at C4-C5 level: Secondary | ICD-10-CM | POA: Diagnosis not present

## 2023-05-02 DIAGNOSIS — M47816 Spondylosis without myelopathy or radiculopathy, lumbar region: Secondary | ICD-10-CM | POA: Diagnosis not present

## 2023-05-02 DIAGNOSIS — M5126 Other intervertebral disc displacement, lumbar region: Secondary | ICD-10-CM | POA: Diagnosis not present

## 2023-05-02 DIAGNOSIS — M51369 Other intervertebral disc degeneration, lumbar region without mention of lumbar back pain or lower extremity pain: Secondary | ICD-10-CM | POA: Diagnosis not present

## 2023-05-02 DIAGNOSIS — M47812 Spondylosis without myelopathy or radiculopathy, cervical region: Secondary | ICD-10-CM | POA: Diagnosis not present

## 2023-05-21 DIAGNOSIS — M5116 Intervertebral disc disorders with radiculopathy, lumbar region: Secondary | ICD-10-CM | POA: Diagnosis not present

## 2023-05-29 DIAGNOSIS — G8929 Other chronic pain: Secondary | ICD-10-CM | POA: Diagnosis not present

## 2023-05-29 DIAGNOSIS — M25511 Pain in right shoulder: Secondary | ICD-10-CM | POA: Diagnosis not present

## 2023-05-29 DIAGNOSIS — Z112 Encounter for screening for other bacterial diseases: Secondary | ICD-10-CM | POA: Diagnosis not present

## 2023-05-29 DIAGNOSIS — J029 Acute pharyngitis, unspecified: Secondary | ICD-10-CM | POA: Diagnosis not present

## 2023-05-29 DIAGNOSIS — M5459 Other low back pain: Secondary | ICD-10-CM | POA: Diagnosis not present

## 2023-05-29 DIAGNOSIS — R739 Hyperglycemia, unspecified: Secondary | ICD-10-CM | POA: Diagnosis not present

## 2023-06-13 DIAGNOSIS — M5459 Other low back pain: Secondary | ICD-10-CM | POA: Diagnosis not present

## 2023-06-13 DIAGNOSIS — M25511 Pain in right shoulder: Secondary | ICD-10-CM | POA: Diagnosis not present

## 2023-06-13 DIAGNOSIS — R739 Hyperglycemia, unspecified: Secondary | ICD-10-CM | POA: Diagnosis not present

## 2023-06-13 DIAGNOSIS — E782 Mixed hyperlipidemia: Secondary | ICD-10-CM | POA: Diagnosis not present

## 2023-06-13 DIAGNOSIS — R5383 Other fatigue: Secondary | ICD-10-CM | POA: Diagnosis not present

## 2023-06-14 DIAGNOSIS — J449 Chronic obstructive pulmonary disease, unspecified: Secondary | ICD-10-CM | POA: Diagnosis not present

## 2023-06-14 DIAGNOSIS — I2510 Atherosclerotic heart disease of native coronary artery without angina pectoris: Secondary | ICD-10-CM | POA: Diagnosis not present

## 2023-06-14 DIAGNOSIS — Z01812 Encounter for preprocedural laboratory examination: Secondary | ICD-10-CM | POA: Diagnosis not present

## 2023-06-14 DIAGNOSIS — R0602 Shortness of breath: Secondary | ICD-10-CM | POA: Diagnosis not present

## 2023-06-14 DIAGNOSIS — D860 Sarcoidosis of lung: Secondary | ICD-10-CM | POA: Diagnosis not present

## 2023-06-14 DIAGNOSIS — K588 Other irritable bowel syndrome: Secondary | ICD-10-CM | POA: Diagnosis not present

## 2023-06-14 DIAGNOSIS — D869 Sarcoidosis, unspecified: Secondary | ICD-10-CM | POA: Diagnosis not present

## 2023-06-14 DIAGNOSIS — Z01810 Encounter for preprocedural cardiovascular examination: Secondary | ICD-10-CM | POA: Diagnosis not present

## 2023-06-14 DIAGNOSIS — G8929 Other chronic pain: Secondary | ICD-10-CM | POA: Diagnosis not present

## 2023-06-14 DIAGNOSIS — Z87891 Personal history of nicotine dependence: Secondary | ICD-10-CM | POA: Diagnosis not present

## 2023-06-14 DIAGNOSIS — M5116 Intervertebral disc disorders with radiculopathy, lumbar region: Secondary | ICD-10-CM | POA: Diagnosis not present

## 2023-06-14 DIAGNOSIS — K219 Gastro-esophageal reflux disease without esophagitis: Secondary | ICD-10-CM | POA: Diagnosis not present

## 2023-06-14 DIAGNOSIS — K589 Irritable bowel syndrome without diarrhea: Secondary | ICD-10-CM | POA: Diagnosis not present

## 2023-06-14 DIAGNOSIS — I10 Essential (primary) hypertension: Secondary | ICD-10-CM | POA: Diagnosis not present

## 2023-06-14 DIAGNOSIS — Z79899 Other long term (current) drug therapy: Secondary | ICD-10-CM | POA: Diagnosis not present

## 2023-06-14 DIAGNOSIS — Z01818 Encounter for other preprocedural examination: Secondary | ICD-10-CM | POA: Diagnosis not present

## 2023-06-14 DIAGNOSIS — Z7982 Long term (current) use of aspirin: Secondary | ICD-10-CM | POA: Diagnosis not present

## 2023-06-14 DIAGNOSIS — E039 Hypothyroidism, unspecified: Secondary | ICD-10-CM | POA: Diagnosis not present

## 2023-06-14 DIAGNOSIS — R002 Palpitations: Secondary | ICD-10-CM | POA: Diagnosis not present

## 2023-06-18 DIAGNOSIS — M7541 Impingement syndrome of right shoulder: Secondary | ICD-10-CM | POA: Diagnosis not present

## 2023-06-18 DIAGNOSIS — M25511 Pain in right shoulder: Secondary | ICD-10-CM | POA: Diagnosis not present

## 2023-06-20 DIAGNOSIS — Z1231 Encounter for screening mammogram for malignant neoplasm of breast: Secondary | ICD-10-CM | POA: Diagnosis not present

## 2023-07-18 DIAGNOSIS — M25511 Pain in right shoulder: Secondary | ICD-10-CM | POA: Diagnosis not present

## 2023-07-26 DIAGNOSIS — Z882 Allergy status to sulfonamides status: Secondary | ICD-10-CM | POA: Diagnosis not present

## 2023-07-26 DIAGNOSIS — G8929 Other chronic pain: Secondary | ICD-10-CM | POA: Diagnosis not present

## 2023-07-26 DIAGNOSIS — E039 Hypothyroidism, unspecified: Secondary | ICD-10-CM | POA: Diagnosis not present

## 2023-07-26 DIAGNOSIS — Z880 Allergy status to penicillin: Secondary | ICD-10-CM | POA: Diagnosis not present

## 2023-07-26 DIAGNOSIS — R609 Edema, unspecified: Secondary | ICD-10-CM | POA: Diagnosis not present

## 2023-07-26 DIAGNOSIS — M7581 Other shoulder lesions, right shoulder: Secondary | ICD-10-CM | POA: Diagnosis not present

## 2023-07-26 DIAGNOSIS — I10 Essential (primary) hypertension: Secondary | ICD-10-CM | POA: Diagnosis not present

## 2023-07-26 DIAGNOSIS — R202 Paresthesia of skin: Secondary | ICD-10-CM | POA: Diagnosis not present

## 2023-07-26 DIAGNOSIS — M25511 Pain in right shoulder: Secondary | ICD-10-CM | POA: Diagnosis not present

## 2023-07-26 DIAGNOSIS — Z87891 Personal history of nicotine dependence: Secondary | ICD-10-CM | POA: Diagnosis not present

## 2023-07-26 DIAGNOSIS — Z885 Allergy status to narcotic agent status: Secondary | ICD-10-CM | POA: Diagnosis not present

## 2023-07-26 DIAGNOSIS — S43431A Superior glenoid labrum lesion of right shoulder, initial encounter: Secondary | ICD-10-CM | POA: Diagnosis not present

## 2023-07-26 DIAGNOSIS — Z79899 Other long term (current) drug therapy: Secondary | ICD-10-CM | POA: Diagnosis not present

## 2023-07-30 DIAGNOSIS — M25511 Pain in right shoulder: Secondary | ICD-10-CM | POA: Diagnosis not present

## 2023-08-19 ENCOUNTER — Telehealth: Payer: Self-pay

## 2023-08-19 DIAGNOSIS — K760 Fatty (change of) liver, not elsewhere classified: Secondary | ICD-10-CM

## 2023-08-19 NOTE — Telephone Encounter (Signed)
 order placed.  MyChart message to patient to go to the lab for LFTs in the next week or so

## 2023-08-19 NOTE — Telephone Encounter (Signed)
-----   Message from [facility] sent at 12/06/2022  8:07 AM EDT ----- Regarding: LFTs Due for LFTs in 08-2023

## 2023-08-22 ENCOUNTER — Ambulatory Visit: Payer: Self-pay | Admitting: Gastroenterology

## 2023-08-22 ENCOUNTER — Other Ambulatory Visit (INDEPENDENT_AMBULATORY_CARE_PROVIDER_SITE_OTHER)

## 2023-08-22 DIAGNOSIS — K760 Fatty (change of) liver, not elsewhere classified: Secondary | ICD-10-CM | POA: Diagnosis not present

## 2023-08-22 LAB — HEPATIC FUNCTION PANEL
ALT: 21 U/L (ref 0–35)
AST: 19 U/L (ref 0–37)
Albumin: 4.1 g/dL (ref 3.5–5.2)
Alkaline Phosphatase: 134 U/L — ABNORMAL HIGH (ref 39–117)
Bilirubin, Direct: 0.2 mg/dL (ref 0.0–0.3)
Total Bilirubin: 0.6 mg/dL (ref 0.2–1.2)
Total Protein: 7.4 g/dL (ref 6.0–8.3)

## 2023-09-16 DIAGNOSIS — M7501 Adhesive capsulitis of right shoulder: Secondary | ICD-10-CM | POA: Diagnosis not present

## 2023-09-16 DIAGNOSIS — M25511 Pain in right shoulder: Secondary | ICD-10-CM | POA: Diagnosis not present

## 2023-09-19 DIAGNOSIS — E782 Mixed hyperlipidemia: Secondary | ICD-10-CM | POA: Diagnosis not present

## 2023-09-19 DIAGNOSIS — R5383 Other fatigue: Secondary | ICD-10-CM | POA: Diagnosis not present

## 2023-09-19 DIAGNOSIS — L918 Other hypertrophic disorders of the skin: Secondary | ICD-10-CM | POA: Diagnosis not present

## 2023-09-19 DIAGNOSIS — K219 Gastro-esophageal reflux disease without esophagitis: Secondary | ICD-10-CM | POA: Diagnosis not present

## 2023-09-19 DIAGNOSIS — R739 Hyperglycemia, unspecified: Secondary | ICD-10-CM | POA: Diagnosis not present

## 2023-09-26 DIAGNOSIS — D869 Sarcoidosis, unspecified: Secondary | ICD-10-CM | POA: Diagnosis not present

## 2023-09-26 DIAGNOSIS — R053 Chronic cough: Secondary | ICD-10-CM | POA: Diagnosis not present

## 2023-09-26 DIAGNOSIS — Z872 Personal history of diseases of the skin and subcutaneous tissue: Secondary | ICD-10-CM | POA: Diagnosis not present

## 2023-09-26 DIAGNOSIS — Z87891 Personal history of nicotine dependence: Secondary | ICD-10-CM | POA: Diagnosis not present

## 2023-10-02 DIAGNOSIS — D485 Neoplasm of uncertain behavior of skin: Secondary | ICD-10-CM | POA: Diagnosis not present

## 2023-10-02 DIAGNOSIS — D225 Melanocytic nevi of trunk: Secondary | ICD-10-CM | POA: Diagnosis not present

## 2023-10-02 DIAGNOSIS — R739 Hyperglycemia, unspecified: Secondary | ICD-10-CM | POA: Diagnosis not present

## 2023-10-10 DIAGNOSIS — M7501 Adhesive capsulitis of right shoulder: Secondary | ICD-10-CM | POA: Diagnosis not present

## 2023-10-23 ENCOUNTER — Other Ambulatory Visit: Payer: Self-pay

## 2023-10-23 ENCOUNTER — Encounter (HOSPITAL_COMMUNITY): Payer: Self-pay

## 2023-10-23 ENCOUNTER — Emergency Department (HOSPITAL_COMMUNITY)
Admission: EM | Admit: 2023-10-23 | Discharge: 2023-10-23 | Disposition: A | Discharge status: Home / Self Care | Attending: Student | Admitting: Student

## 2023-10-23 ENCOUNTER — Emergency Department (HOSPITAL_COMMUNITY)

## 2023-10-23 DIAGNOSIS — I6782 Cerebral ischemia: Secondary | ICD-10-CM | POA: Diagnosis not present

## 2023-10-23 DIAGNOSIS — G8929 Other chronic pain: Secondary | ICD-10-CM | POA: Insufficient documentation

## 2023-10-23 DIAGNOSIS — Z7982 Long term (current) use of aspirin: Secondary | ICD-10-CM | POA: Insufficient documentation

## 2023-10-23 DIAGNOSIS — M48061 Spinal stenosis, lumbar region without neurogenic claudication: Secondary | ICD-10-CM | POA: Diagnosis not present

## 2023-10-23 DIAGNOSIS — E039 Hypothyroidism, unspecified: Secondary | ICD-10-CM | POA: Insufficient documentation

## 2023-10-23 DIAGNOSIS — R29818 Other symptoms and signs involving the nervous system: Secondary | ICD-10-CM | POA: Diagnosis not present

## 2023-10-23 DIAGNOSIS — M4322 Fusion of spine, cervical region: Secondary | ICD-10-CM | POA: Diagnosis not present

## 2023-10-23 DIAGNOSIS — F17210 Nicotine dependence, cigarettes, uncomplicated: Secondary | ICD-10-CM | POA: Insufficient documentation

## 2023-10-23 DIAGNOSIS — Z79899 Other long term (current) drug therapy: Secondary | ICD-10-CM | POA: Diagnosis not present

## 2023-10-23 DIAGNOSIS — D72829 Elevated white blood cell count, unspecified: Secondary | ICD-10-CM | POA: Insufficient documentation

## 2023-10-23 DIAGNOSIS — M25511 Pain in right shoulder: Secondary | ICD-10-CM | POA: Diagnosis not present

## 2023-10-23 DIAGNOSIS — J4489 Other specified chronic obstructive pulmonary disease: Secondary | ICD-10-CM | POA: Diagnosis not present

## 2023-10-23 DIAGNOSIS — M5126 Other intervertebral disc displacement, lumbar region: Secondary | ICD-10-CM | POA: Diagnosis not present

## 2023-10-23 DIAGNOSIS — J341 Cyst and mucocele of nose and nasal sinus: Secondary | ICD-10-CM | POA: Diagnosis not present

## 2023-10-23 DIAGNOSIS — I10 Essential (primary) hypertension: Secondary | ICD-10-CM | POA: Insufficient documentation

## 2023-10-23 DIAGNOSIS — M4722 Other spondylosis with radiculopathy, cervical region: Secondary | ICD-10-CM | POA: Diagnosis not present

## 2023-10-23 DIAGNOSIS — Z981 Arthrodesis status: Secondary | ICD-10-CM | POA: Diagnosis not present

## 2023-10-23 DIAGNOSIS — M5010 Cervical disc disorder with radiculopathy, unspecified cervical region: Secondary | ICD-10-CM | POA: Diagnosis not present

## 2023-10-23 LAB — CBC WITH DIFFERENTIAL/PLATELET
Abs Immature Granulocytes: 0.05 K/uL (ref 0.00–0.07)
Basophils Absolute: 0.1 K/uL (ref 0.0–0.1)
Basophils Relative: 1 %
Eosinophils Absolute: 0.1 K/uL (ref 0.0–0.5)
Eosinophils Relative: 1 %
HCT: 37.9 % (ref 36.0–46.0)
Hemoglobin: 11.9 g/dL — ABNORMAL LOW (ref 12.0–15.0)
Immature Granulocytes: 0 %
Lymphocytes Relative: 19 %
Lymphs Abs: 2.1 K/uL (ref 0.7–4.0)
MCH: 30.4 pg (ref 26.0–34.0)
MCHC: 31.4 g/dL (ref 30.0–36.0)
MCV: 96.9 fL (ref 80.0–100.0)
Monocytes Absolute: 0.9 K/uL (ref 0.1–1.0)
Monocytes Relative: 8 %
Neutro Abs: 8.2 K/uL — ABNORMAL HIGH (ref 1.7–7.7)
Neutrophils Relative %: 71 %
Platelets: 316 K/uL (ref 150–400)
RBC: 3.91 MIL/uL (ref 3.87–5.11)
RDW: 12.9 % (ref 11.5–15.5)
WBC: 11.4 K/uL — ABNORMAL HIGH (ref 4.0–10.5)
nRBC: 0 % (ref 0.0–0.2)

## 2023-10-23 LAB — COMPREHENSIVE METABOLIC PANEL WITH GFR
ALT: 72 U/L — ABNORMAL HIGH (ref 0–44)
AST: 133 U/L — ABNORMAL HIGH (ref 15–41)
Albumin: 3.4 g/dL — ABNORMAL LOW (ref 3.5–5.0)
Alkaline Phosphatase: 194 U/L — ABNORMAL HIGH (ref 38–126)
Anion gap: 15 (ref 5–15)
BUN: 17 mg/dL (ref 6–20)
CO2: 26 mmol/L (ref 22–32)
Calcium: 9.2 mg/dL (ref 8.9–10.3)
Chloride: 98 mmol/L (ref 98–111)
Creatinine, Ser: 0.8 mg/dL (ref 0.44–1.00)
GFR, Estimated: >60 mL/min (ref >60)
Glucose, Bld: 110 mg/dL — ABNORMAL HIGH (ref 70–99)
Potassium: 3.7 mmol/L (ref 3.5–5.1)
Sodium: 139 mmol/L (ref 135–145)
Total Bilirubin: 1.1 mg/dL (ref 0.0–1.2)
Total Protein: 7.2 g/dL (ref 6.5–8.1)

## 2023-10-23 MED ORDER — KETOROLAC TROMETHAMINE 15 MG/ML IJ SOLN
15.0000 mg | Freq: Once | INTRAMUSCULAR | Status: AC
  Administered 2023-10-23: 15 mg via INTRAVENOUS
  Filled 2023-10-23: qty 1

## 2023-10-23 MED ORDER — GABAPENTIN 300 MG PO CAPS
300.0000 mg | ORAL_CAPSULE | Freq: Once | ORAL | Status: AC
  Administered 2023-10-23: 300 mg via ORAL
  Filled 2023-10-23: qty 1

## 2023-10-23 MED ORDER — ONDANSETRON HCL 4 MG/2ML IJ SOLN
4.0000 mg | Freq: Once | INTRAMUSCULAR | Status: AC
  Administered 2023-10-23: 4 mg via INTRAVENOUS
  Filled 2023-10-23: qty 2

## 2023-10-23 MED ORDER — PREDNISONE 10 MG (21) PO TBPK: ORAL_TABLET | ORAL | 0 refills | Status: DC

## 2023-10-23 MED ORDER — HYDROMORPHONE HCL 1 MG/ML IJ SOLN
1.0000 mg | Freq: Once | INTRAMUSCULAR | Status: AC
  Administered 2023-10-23: 1 mg via INTRAVENOUS
  Filled 2023-10-23: qty 1

## 2023-10-23 NOTE — ED Provider Notes (Signed)
    ED Course / MDM   Clinical Course as of 10/23/23 1648  Wed Oct 23, 2023  1519 Received sign out from Dr. Albertina pending MRI brain. Had MRI lumbar and C spine without acute finding. Had R arm and leg pain. Reports pain limiting ambulation  [WS]  1646 Patient feels better. MRI imaging did not reveal any dangerous cause of her symptoms. She feels better and able to walk. She requests steroid taper and feels this has helped her shoulder in the past. She reports symptoms are similar to her chronic shoulder and back pain. Low concern for dangerous /emergent diagnosis. Feel patient is stable for discharge. Will discharge patient to home. All questions answered. Patient comfortable with plan of discharge. Return precautions discussed with patient and specified on the after visit summary.  [WS]    Clinical Course User Index [WS] Francesca Elsie CROME, MD   Medical Decision Making Amount and/or Complexity of Data Reviewed Labs: ordered. Radiology: ordered.  Risk Prescription drug management.         Francesca Elsie CROME, MD 10/23/23 [phone]

## 2023-10-23 NOTE — ED Notes (Signed)
 Pt ambulated to BR and back to room with mild unsteadiness.  Pt denies having a cane or walker at  home for use.

## 2023-10-23 NOTE — ED Notes (Signed)
 Pt c/o SOB after dilaudid  given, RA sats 88-90%, EDP made aware. Placed pt on 4L/M [place]-sats increased to 98%

## 2023-10-23 NOTE — ED Triage Notes (Signed)
 Pt arrived via POV c/o recurrent right shoulder pain, right leg pain, back pain and reports today she is now unable to walk due to the pain. Pt reports she has upcoming shoulder surgery on 7/28, but the pain is to severe.

## 2023-10-23 NOTE — ED Notes (Signed)
 Pt returned from MRI

## 2023-10-23 NOTE — ED Notes (Signed)
 Pt placed on telemetry.

## 2023-10-23 NOTE — Discharge Instructions (Signed)
 We evaluated you for your shoulder and leg pain. Your testing including MRI scans didn't show any signs of a stroke or injury to your spinal cord. Since you have had improvement with steroids before we have given you a steroid taper.  Please return if your symptoms worsen or you develop any other new symptoms like a fever.

## 2023-10-23 NOTE — ED Provider Notes (Signed)
 [facility] AT [facility] Provider Note  CSN: [id number] Arrival date & time: 10/23/23 1046  Chief Complaint(s) Shoulder Pain  HPI Keanu Frickey is a 58 y.o. female with PMH adhesive capsulitis with chronic shoulder pain, chronic tingling right upper and right lower extremity pain, right upper and right lower extremity weakness and inability to walk.  She states that she is scheduled for surgery on her shoulder in a few weeks but this pain extends beyond the shoulder.  She states that she woke up this morning with pain so severe that she is unable to walk.  She also is endorsing weakness of the right upper extremity and right lower extremity that is new.  Denies chest pain, shortness of breath, headache, fever or other systemic symptoms.   Past Medical History Past Medical History:  Diagnosis Date   Anxiety    Arthritis    Asthma    Back pain    Cervicogenic headache 08/20/2016   Chronic headaches    Chronic pain    COPD (chronic obstructive pulmonary disease) (HCC)    Depression    Emphysema    Emphysema of lung (HCC)    Fatty liver    GERD (gastroesophageal reflux disease)    High cholesterol    Hypertension    Hypothyroidism    IBS (irritable bowel syndrome)    Joint pain    RLS (restless legs syndrome)    Thyroid  disease    Patient Active Problem List   Diagnosis Date Noted   Diarrhea 04/25/2021   Alkaline phosphatase elevation 04/25/2021   Class 2 severe obesity with serious comorbidity and body mass index (BMI) of 35.0 to 35.9 in adult [facility]) 06/22/2019   Vitamin D  deficiency 06/09/2019   Back pain 06/02/2019   Neck pain 06/02/2019   Symptomatic mammary hypertrophy 06/02/2019   Hashimoto's thyroiditis 05/04/2019   Prediabetes 03/19/2019   Essential hypertension, benign 03/19/2019   Mixed hyperlipidemia 03/19/2019   Cervical disc herniation 12/17/2018   Cervicogenic headache 08/20/2016   Dizzy 08/20/2016   Current smoker  03/10/2010   DEPRESSION 03/10/2010   Asthma 03/10/2010   Acute conjunctivitis 12/05/2007   SARCOIDOSIS 11/01/2006   Hypothyroidism 11/01/2006   GERD 11/01/2006   DISPLACEMENT, 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 Authorizing Provider  predniSONE  (STERAPRED UNI-PAK 21 TAB) 10 MG (21) TBPK tablet Take 6 tabs by mouth daily  for 2 days, then 5 tabs for 2 days, then 4 tabs for 2 days, then 3 tabs for 2 days, 2 tabs for 2 days, then 1 tab by mouth daily for 2 days 10/23/23  Yes Scheving, Elsie CROME, MD  ALPRAZolam  (XANAX ) 1 MG tablet Take 1 mg by mouth 4 (four) times daily as needed for anxiety.     [provider]  aspirin  EC 81 MG tablet Take 1 tablet (81 mg total) by mouth daily. Swallow whole. 10/19/21   Alvan Dorn FALCON, MD  atorvastatin  (LIPITOR) 20 MG tablet Take 20 mg by mouth at bedtime.    [provider]  baclofen (LIORESAL) 10 MG tablet Take 10 mg by mouth in the morning and at bedtime. 05/29/19   [provider]  colestipol  (COLESTID ) 1 g tablet Take 2 tablets (2 g total) by mouth 2 (two) times daily. 12/05/22   Armbruster,  Elspeth SQUIBB, MD  dicyclomine  (BENTYL ) 10 MG capsule TAKE ONE CAPSULE BY MOUTH THREE TIMES DAILY AS NEEDED FOR SPASMS 04/04/22   Carlan, Chelsea L, NP  escitalopram  (LEXAPRO ) 20 MG tablet Take 20 mg by mouth daily. 05/20/19   [provider]  esomeprazole  (NEXIUM ) 40 MG capsule Take 1 capsule (40 mg total) by mouth daily before breakfast. 04/25/21   Rehman, Claudis PENNER, MD  HYDROcodone -acetaminophen  (NORCO) 10-325 MG tablet Take 1 tablet by mouth every 6 (six) hours as needed (pain.).     [provider]  losartan (COZAAR) 50 MG tablet Take 50 mg by mouth daily.    [provider]  metoCLOPramide  (REGLAN ) 5 MG tablet Take 1 tablet  (5 mg total) by mouth 3 (three) times daily as needed for nausea. Take before meals, trial for 1 month. 01/15/23   Armbruster, Elspeth SQUIBB, MD  montelukast (SINGULAIR) 10 MG tablet Take 10 mg by mouth at bedtime.    [provider]  ondansetron  (ZOFRAN ) 4 MG tablet Take 1 tablet (4 mg total) by mouth every 8 (eight) hours as needed for nausea or vomiting. Patient not taking: Reported on 01/15/2023 07/14/19   Scheeler, Donnice PARAS, PA-C  Peppermint Oil (IBGARD) 90 MG CPCR Take as directed 12/05/22   Armbruster, Elspeth SQUIBB, MD  PROAIR  HFA 108 (90 BASE) MCG/ACT inhaler Inhale 2 puffs into the lungs every 4 (four) hours as needed for wheezing or shortness of breath.  06/19/13   [provider]  propranolol (INDERAL) 80 MG tablet Take 80 mg by mouth daily.    [provider]  QUEtiapine  (SEROQUEL ) 300 MG tablet 1 tablet in the morning and 1.5 tablets at bedtime    [provider]  rOPINIRole  (REQUIP ) 4 MG tablet Take 4 mg by mouth at bedtime. 05/06/19   [provider]  SYNTHROID  150 MCG tablet Take 1 tablet (150 mcg total) by mouth daily before breakfast. Patient taking differently: Take 200 mcg by mouth daily before breakfast. 07/06/19   Nida, Gebreselassie W, MD  traZODone (DESYREL) 100 MG tablet Take 100 mg by mouth at bedtime as needed.    [provider]                                                                                                                                    Past Surgical History Past Surgical History:  Procedure Laterality Date   ANTERIOR CERVICAL DECOMP/DISCECTOMY FUSION N/A 12/17/2018   Procedure: ANTERIOR CERVICAL DECOMPRESSION/DISCECTOMY FUSION CERVIAL FIVE THROUGH SIX;  Surgeon: Burnetta Aures, MD;  Location: [facility] OR;  Service: Orthopedics;  Laterality: N/A;  2.5 hrs   APPENDECTOMY     BACK SURGERY     x4   BIOPSY  06/12/2019   Procedure: BIOPSY;  Surgeon: Golda Claudis PENNER, MD;  Location: [facility];  Service: Endoscopy;;   random, sigmoid   BREAST REDUCTION SURGERY Bilateral 08/06/2019   Procedure: BILATERAL  MAMMARY REDUCTION (BREAST) WITH LIPOSUCTION;  Surgeon: Lowery Estefana RAMAN, DO;  Location: [facility];  Service: Plastics;  Laterality: Bilateral;  3 hours   CHOLECYSTECTOMY  2010   COLONOSCOPY W/ POLYPECTOMY  07/2013   1 polyp removal   COLONOSCOPY WITH PROPOFOL  N/A 06/12/2019   Procedure: COLONOSCOPY WITH PROPOFOL ;  Surgeon: Golda Claudis PENNER, MD;  Location: [facility];  Service: Endoscopy;  Laterality: N/A;  825   CYST REMOVAL HAND  2015   EXCISION MASS UPPER EXTREMETIES Left 02/25/2017   Procedure: EXCISION MASS UPPER EXTREMETIES, LEFT PALM;  Surgeon: Murrell Drivers, MD;  Location: [facility];  Service: Orthopedics;  Laterality: Left;   POLYPECTOMY  06/12/2019   Procedure: POLYPECTOMY;  Surgeon: Golda Claudis PENNER, MD;  Location: [facility];  Service: Endoscopy;;  ascending;hepatic flexurex2;splenic flexure   Family History Family History  Problem Relation Age of Onset   Hypertension Mother    Depression Mother    Anxiety disorder Mother    Hypertension Father    Heart disease Maternal Grandfather    Heart disease Paternal Grandmother    Colon cancer Neg Hx    Stomach cancer Neg Hx    Esophageal cancer Neg Hx     Social History Social History   Tobacco Use   Smoking status: Former    Current packs/day: 0.00    Types: Cigarettes    Quit date: 06/15/2011    Years since quitting: 12.3   Smokeless tobacco: Never  Vaping Use   Vaping status: Never Used  Substance Use Topics   Alcohol use: Yes    Comment: rare   Drug use: No   Allergies Pneumococcal vaccines, Amoxicillin, Codeine, Hydromorphone  hcl, Sulfonamide derivatives, Adhesive [tape], and Morphine and codeine  Review of Systems Review of Systems  Musculoskeletal:  Positive for arthralgias and myalgias.  Neurological:  Positive for weakness.    Physical Exam Vital Signs  I have reviewed the triage  vital signs BP (!) 140/74 (BP Location: Right Arm)   Pulse 70   Temp 97.8 F (36.6 C) (Oral)   Resp 16   Ht 5' 5 (1.651 m)   Wt 106.1 kg   LMP 12/15/2013 (Approximate)   SpO2 96%   BMI 38.92 kg/m   Physical Exam Vitals and nursing note reviewed.  Constitutional:      General: She is not in acute distress.    Appearance: She is well-developed.  HENT:     Head: Normocephalic and atraumatic.  Eyes:     Conjunctiva/sclera: Conjunctivae normal.  Cardiovascular:     Rate and Rhythm: Normal rate and regular rhythm.     Heart sounds: No murmur heard. Pulmonary:     Effort: Pulmonary effort is normal. No respiratory distress.     Breath sounds: Normal breath sounds.  Abdominal:     Palpations: Abdomen is soft.     Tenderness: There is no abdominal tenderness.  Musculoskeletal:        General: Tenderness present. No swelling.     Cervical back: Neck supple.  Skin:    General: Skin is warm and dry.     Capillary Refill: Capillary refill takes less than 2 seconds.  Neurological:     Mental Status: She is alert.     Motor: Weakness present.  Psychiatric:        Mood and Affect: Mood normal.     ED Results and Treatments Labs (all labs ordered are listed, but only abnormal results are displayed) Labs  Reviewed  COMPREHENSIVE METABOLIC PANEL WITH GFR - Abnormal; Notable for the following components:      Result Value   Glucose, Bld 110 (*)    Albumin 3.4 (*)    AST 133 (*)    ALT 72 (*)    Alkaline Phosphatase 194 (*)    All other components within normal limits  CBC WITH DIFFERENTIAL/PLATELET - Abnormal; Notable for the following components:   WBC 11.4 (*)    Hemoglobin 11.9 (*)    Neutro Abs 8.2 (*)    All other components within normal limits                                                                                                                          Radiology MR BRAIN WO CONTRAST Result Date: 10/23/2023 CLINICAL DATA:  Neuro deficit, acute, stroke  suspected. EXAM: MRI HEAD WITHOUT CONTRAST TECHNIQUE: Multiplanar, multiecho pulse sequences of the brain and surrounding structures were obtained without intravenous contrast. COMPARISON:  None Available. FINDINGS: Brain: There is no evidence of an acute infarct, intracranial hemorrhage, mass, midline shift, or extra-axial fluid collection. Cerebral volume is normal. The ventricles are normal in size. Patchy T2 hyperintensities in the subcortical and deep cerebral white matter bilaterally are nonspecific but compatible with chronic small vessel ischemic disease which is moderately advanced for age. A partially empty sella is noted. Vascular: Major intracranial vascular flow voids are preserved. Skull and upper cervical spine: Unremarkable bone marrow signal. Sinuses/Orbits: Unremarkable orbits. Small mucous retention cyst in the right maxillary sinus. No significant mastoid fluid. Other: None. IMPRESSION: 1. No acute intracranial abnormality. 2. Moderate chronic small vessel ischemic disease. Electronically Signed   By: Dasie Hamburg M.D.   On: 10/23/2023 16:25   MR Cervical Spine Wo Contrast Result Date: 10/23/2023 CLINICAL DATA:  Cervical radiculopathy, no red flags. Neck pain. History of cervical spine fusion. EXAM: MRI CERVICAL SPINE WITHOUT CONTRAST TECHNIQUE: Multiplanar, multisequence MR imaging of the cervical spine was performed. No intravenous contrast was administered. COMPARISON:  Cervical spine MRI 05/02/2023 FINDINGS: The study is motion degraded throughout, including severe motion on axial sequences. Alignment: Chronic straightening of the normal cervical lordosis. No significant listhesis. Vertebrae: No fracture or suspicious marrow lesion. Prior C5-6 ACDF. Cord: Limited assessment due to motion artifact. No definite cord signal abnormality. Posterior Fossa, vertebral arteries, paraspinal tissues: Unremarkable. Disc levels: C2-3: Tiny central disc protrusion and mild left facet arthrosis without  evidence of significant stenosis. C3-4: Tiny right paracentral disc protrusion and mild to moderate left facet arthrosis without evidence of significant stenosis. C4-5: Mild disc bulging, mild uncovertebral spurring, and mild left facet arthrosis without evidence of significant stenosis. C5-6: Prior ACDF.  No evidence of significant stenosis. C6-7: Mild left facet arthrosis without evidence of significant stenosis. C7-T1: Negative. The above degenerative changes are grossly unchanged from the prior MRI within limitation of motion artifact. IMPRESSION: 1. Motion degraded examination. 2. Prior C5-6 ACDF and grossly unchanged disc and facet degeneration without  evidence of significant stenosis. Electronically Signed   By: Dasie Hamburg M.D.   On: 10/23/2023 13:32   MR LUMBAR SPINE WO CONTRAST Result Date: 10/23/2023 CLINICAL DATA:  Low back pain, cauda equina syndrome suspected. History of lumbar fusion. EXAM: MRI LUMBAR SPINE WITHOUT CONTRAST TECHNIQUE: Multiplanar, multisequence MR imaging of the lumbar spine was performed. No intravenous contrast was administered. COMPARISON:  Lumbar spine MRI 05/02/2023 FINDINGS: Segmentation:  Standard. Alignment:  Unchanged trace retrolisthesis of L3 on L4. Vertebrae: No fracture, suspicious marrow lesion, or significant marrow edema. Prior L4-S1 posterior and interbody fusion. Conus medullaris and cauda equina: Conus extends to the L2 level. Conus and cauda equina appear normal. Paraspinal and other soft tissues: Postoperative changes in the posterior lumbar soft tissues. No fluid collection. Disc levels: T12-L1: Normal disc.  Mild facet hypertrophy without stenosis. L1-2: Normal disc.  Mild facet hypertrophy without stenosis. L2-3: Small left foraminal disc protrusion and mild facet hypertrophy without stenosis, unchanged. L3-4: Disc desiccation and moderate disc space narrowing. Disc bulging and moderate facet and ligamentum flavum hypertrophy result in mild spinal stenosis,  mild bilateral lateral recess stenosis, and mild bilateral neural foraminal stenosis. Spinal and lateral recess stenosis is slightly less prominent than on the prior MRI. L4-5: Prior posterior decompression and fusion.  No stenosis. L5-S1: Prior posterior decompression and fusion.  No stenosis. IMPRESSION: 1. Prior L4-S1 fusion without stenosis. 2. 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 Electronically Signed   By: Dasie Hamburg M.D.   On: 10/23/2023 13:26    Pertinent labs & imaging results that were available during my care of the patient were reviewed by me and considered in my medical decision making (see MDM for details).  Medications Ordered in ED Medications  ketorolac  (TORADOL ) 15 MG/ML injection 15 mg (15 mg Intravenous Given 10/23/23 1251)  HYDROmorphone  (DILAUDID ) injection 1 mg (1 mg Intravenous Given 10/23/23 1254)  ondansetron  (ZOFRAN ) injection 4 mg (4 mg Intravenous Given 10/23/23 1250)  gabapentin  (NEURONTIN ) capsule 300 mg (300 mg Oral Given 10/23/23 1418)  ketorolac  (TORADOL ) 15 MG/ML injection 15 mg (15 mg Intravenous Given 10/23/23 1418)                                                                                                                                     Procedures Procedures  (including critical care time)  Medical Decision Making / ED Course   This patient presents to the ED for concern of arm pain, leg pain, back pain, weakness, this involves an extensive number of treatment options, and is a complaint that carries with it a high risk of complications and morbidity.  The differential diagnosis includes acute on chronic shoulder and back pain, musculoskeletal pain, cervical stenosis, cauda equina, CVA  MDM: Patient seen emergency room for evaluation of multiple complaints described above.  Physical exam reveals significant tenderness in the right upper and  right lower extremities with 4 out  of 5 strength of the right upper and lower extremities.  Cranial nerve exam is unremarkable.  Laboratory evaluation with a leukocytosis to 11.4, hemoglobin 11.9, AST 133, ALT 72 but is otherwise unremarkable.  Initially I had concerns for spinal pathology and patient went for an MRI C-spine and L-spine.  These did not reveal any significant spinal stenosis to the degree that would be causing her symptoms.  On reevaluation her pain is out of control and she received Toradol  and Dilaudid .  Unfortunately, patient had a significant reaction to the Dilaudid  where she felt the sensation of chest heaviness and shortness of breath with associated hypoxia.  She was briefly placed on nasal cannula and the symptoms resolved as the Dilaudid  wore off.  Multimodal pain control attempted with gabapentin  and additional dose of Toradol  with some mild improvement of symptoms.  However her weakness persisted and patient was sent back for an MRI brain to rule out CVA.  At this time she is pending completion of this imaging study.  Please see provider signout of continuation of workup.   Additional history obtained: -Additional history obtained from family member -External records from outside source obtained and reviewed including: Chart review including previous notes, labs, imaging, consultation notes   Lab Tests: -I ordered, reviewed, and interpreted labs.   The pertinent results include:   Labs Reviewed  COMPREHENSIVE METABOLIC PANEL WITH GFR - Abnormal; Notable for the following components:      Result Value   Glucose, Bld 110 (*)    Albumin 3.4 (*)    AST 133 (*)    ALT 72 (*)    Alkaline Phosphatase 194 (*)    All other components within normal limits  CBC WITH DIFFERENTIAL/PLATELET - Abnormal; Notable for the following components:   WBC 11.4 (*)    Hemoglobin 11.9 (*)    Neutro Abs 8.2 (*)    All other components within normal limits     Imaging Studies ordered: I ordered imaging studies including  MRI C-spine, L-spine I independently visualized and interpreted imaging. I agree with the radiologist interpretation  MRI brain is pending   Medicines ordered and prescription drug management: Meds ordered this encounter  Medications   ketorolac  (TORADOL ) 15 MG/ML injection 15 mg   HYDROmorphone  (DILAUDID ) injection 1 mg   ondansetron  (ZOFRAN ) injection 4 mg   gabapentin  (NEURONTIN ) capsule 300 mg   ketorolac  (TORADOL ) 15 MG/ML injection 15 mg   predniSONE  (STERAPRED UNI-PAK 21 TAB) 10 MG (21) TBPK tablet    Sig: Take 6 tabs by mouth daily  for 2 days, then 5 tabs for 2 days, then 4 tabs for 2 days, then 3 tabs for 2 days, 2 tabs for 2 days, then 1 tab by mouth daily for 2 days    Dispense:  42 tablet    Refill:  0    -I have reviewed the patients home medicines and have made adjustments as needed  Critical interventions none     Cardiac Monitoring: The patient was maintained on a cardiac monitor.  I personally viewed and interpreted the cardiac monitored which showed an underlying rhythm of: NSR  Social Determinants of Health:  Factors impacting patients care include: none   Reevaluation: After the interventions noted above, I reevaluated the patient and found that they have :improved  Co morbidities that complicate the patient evaluation  Past Medical History:  Diagnosis Date   Anxiety    Arthritis    Asthma  Back pain    Cervicogenic headache 08/20/2016   Chronic headaches    Chronic pain    COPD (chronic obstructive pulmonary disease) (HCC)    Depression    Emphysema    Emphysema of lung (HCC)    Fatty liver    GERD (gastroesophageal reflux disease)    High cholesterol    Hypertension    Hypothyroidism    IBS (irritable bowel syndrome)    Joint pain    RLS (restless legs syndrome)    Thyroid  disease       Dispostion: I considered admission for this patient, and disposition pending completion of imaging studies.  Please see provider center note  continuation of workup     Final Clinical Impression(s) / ED Diagnoses Final diagnoses:  Chronic right shoulder pain     @PCDICTATION @    Narelle Schoening, Lum, MD 10/23/23 1952

## 2023-10-23 NOTE — ED Notes (Signed)
 Transported to MRI

## 2023-10-23 NOTE — ED Notes (Signed)
 Pts last imaging studies available from [facility] on 07/26/2023.

## 2023-11-04 DIAGNOSIS — M19011 Primary osteoarthritis, right shoulder: Secondary | ICD-10-CM | POA: Diagnosis not present

## 2023-11-04 DIAGNOSIS — M7521 Bicipital tendinitis, right shoulder: Secondary | ICD-10-CM | POA: Diagnosis not present

## 2023-11-04 DIAGNOSIS — S43431A Superior glenoid labrum lesion of right shoulder, initial encounter: Secondary | ICD-10-CM | POA: Diagnosis not present

## 2023-11-04 DIAGNOSIS — M7551 Bursitis of right shoulder: Secondary | ICD-10-CM | POA: Diagnosis not present

## 2023-11-04 DIAGNOSIS — M7501 Adhesive capsulitis of right shoulder: Secondary | ICD-10-CM | POA: Diagnosis not present

## 2023-11-04 DIAGNOSIS — M24111 Other articular cartilage disorders, right shoulder: Secondary | ICD-10-CM | POA: Diagnosis not present

## 2023-11-04 DIAGNOSIS — M7541 Impingement syndrome of right shoulder: Secondary | ICD-10-CM | POA: Diagnosis not present

## 2023-11-04 DIAGNOSIS — G8918 Other acute postprocedural pain: Secondary | ICD-10-CM | POA: Diagnosis not present

## 2023-11-11 DIAGNOSIS — M25511 Pain in right shoulder: Secondary | ICD-10-CM | POA: Diagnosis not present

## 2023-11-11 DIAGNOSIS — M25611 Stiffness of right shoulder, not elsewhere classified: Secondary | ICD-10-CM | POA: Diagnosis not present

## 2023-11-18 ENCOUNTER — Telehealth: Payer: Self-pay

## 2023-11-18 DIAGNOSIS — M25611 Stiffness of right shoulder, not elsewhere classified: Secondary | ICD-10-CM | POA: Diagnosis not present

## 2023-11-18 DIAGNOSIS — K760 Fatty (change of) liver, not elsewhere classified: Secondary | ICD-10-CM

## 2023-11-18 DIAGNOSIS — M25511 Pain in right shoulder: Secondary | ICD-10-CM | POA: Diagnosis not present

## 2023-11-18 NOTE — Telephone Encounter (Signed)
-----   Message from [facility] sent at 08/22/2023  4:26 PM EDT ----- Regarding: labs due  LFTs and GGT in 3 months  = 11-2023 Place orders

## 2023-11-18 NOTE — Telephone Encounter (Signed)
MyChart message to patient to go for labs

## 2023-11-19 ENCOUNTER — Other Ambulatory Visit (INDEPENDENT_AMBULATORY_CARE_PROVIDER_SITE_OTHER)

## 2023-11-19 ENCOUNTER — Ambulatory Visit: Payer: Self-pay | Admitting: Gastroenterology

## 2023-11-19 DIAGNOSIS — R748 Abnormal levels of other serum enzymes: Secondary | ICD-10-CM

## 2023-11-19 DIAGNOSIS — K760 Fatty (change of) liver, not elsewhere classified: Secondary | ICD-10-CM | POA: Diagnosis not present

## 2023-11-19 LAB — HEPATIC FUNCTION PANEL
ALT: 20 U/L (ref 0–35)
AST: 19 U/L (ref 0–37)
Albumin: 3.8 g/dL (ref 3.5–5.2)
Alkaline Phosphatase: 143 U/L — ABNORMAL HIGH (ref 39–117)
Bilirubin, Direct: 0.3 mg/dL (ref 0.0–0.3)
Total Bilirubin: 0.7 mg/dL (ref 0.2–1.2)
Total Protein: 7.5 g/dL (ref 6.0–8.3)

## 2023-11-19 LAB — GAMMA GT: GGT: 86 U/L — ABNORMAL HIGH (ref 7–51)

## 2023-11-20 ENCOUNTER — Telehealth: Payer: Self-pay | Admitting: Gastroenterology

## 2023-11-20 NOTE — Telephone Encounter (Signed)
 See lab results.

## 2023-11-20 NOTE — Telephone Encounter (Signed)
 Inbound call from patient stating that she ha received her lab results back and is wanting to know if she needs to come in for an appointment. Patient is requesting a call back to discuss. Please advise.

## 2023-11-21 ENCOUNTER — Other Ambulatory Visit (INDEPENDENT_AMBULATORY_CARE_PROVIDER_SITE_OTHER)

## 2023-11-21 DIAGNOSIS — R748 Abnormal levels of other serum enzymes: Secondary | ICD-10-CM | POA: Diagnosis not present

## 2023-11-25 DIAGNOSIS — E860 Dehydration: Secondary | ICD-10-CM | POA: Diagnosis not present

## 2023-11-25 DIAGNOSIS — I959 Hypotension, unspecified: Secondary | ICD-10-CM | POA: Diagnosis not present

## 2023-11-25 DIAGNOSIS — M25511 Pain in right shoulder: Secondary | ICD-10-CM | POA: Diagnosis not present

## 2023-11-25 DIAGNOSIS — A084 Viral intestinal infection, unspecified: Secondary | ICD-10-CM | POA: Diagnosis not present

## 2023-11-25 DIAGNOSIS — M25611 Stiffness of right shoulder, not elsewhere classified: Secondary | ICD-10-CM | POA: Diagnosis not present

## 2023-11-28 LAB — ANA: Anti Nuclear Antibody (ANA): NEGATIVE

## 2023-11-28 LAB — ANTI-SMOOTH MUSCLE ANTIBODY, IGG: Actin (Smooth Muscle) Antibody (IGG): <20 U (ref <20)

## 2023-11-28 LAB — IGG: IgG (Immunoglobin G), Serum: 1066 mg/dL (ref 600–1640)

## 2023-11-28 LAB — MITOCHONDRIAL ANTIBODIES: Mitochondrial M2 Ab, IgG: <=20 U (ref <=20.0)

## 2023-11-29 ENCOUNTER — Telehealth: Payer: Self-pay | Admitting: Gastroenterology

## 2023-11-29 ENCOUNTER — Ambulatory Visit: Payer: Self-pay | Admitting: Gastroenterology

## 2023-11-29 DIAGNOSIS — E860 Dehydration: Secondary | ICD-10-CM | POA: Diagnosis not present

## 2023-11-29 DIAGNOSIS — R748 Abnormal levels of other serum enzymes: Secondary | ICD-10-CM

## 2023-11-29 NOTE — Telephone Encounter (Signed)
 Patient returning call. Please advise. Thank you.

## 2023-11-29 NOTE — Telephone Encounter (Signed)
 Following multiple attempts between patient and I to speak to one another again, we have been unsucessful. I have left a voicemail on patient's phone with the time/date/location/prep for her MRCP and have asked that she reach out if she has any further questions.

## 2023-11-29 NOTE — Telephone Encounter (Signed)
 See results follow up note dated  11/29/23 for additional information.  Called patient back. Had to leave another message.

## 2023-11-29 NOTE — Telephone Encounter (Signed)
 Inbound call from patient stating she would like to speak to nurse in regards to MRI appt. Has some questions  Requesting a call back  Please advise  Thank you

## 2023-12-07 ENCOUNTER — Ambulatory Visit (HOSPITAL_COMMUNITY)
Admission: RE | Admit: 2023-12-07 | Discharge: 2023-12-07 | Disposition: A | Discharge status: Home / Self Care | Source: Ambulatory Visit | Attending: Gastroenterology | Admitting: Gastroenterology

## 2023-12-07 ENCOUNTER — Other Ambulatory Visit (HOSPITAL_COMMUNITY): Payer: Self-pay | Admitting: Gastroenterology

## 2023-12-07 DIAGNOSIS — R52 Pain, unspecified: Secondary | ICD-10-CM | POA: Insufficient documentation

## 2023-12-07 DIAGNOSIS — Z9049 Acquired absence of other specified parts of digestive tract: Secondary | ICD-10-CM | POA: Diagnosis not present

## 2023-12-07 DIAGNOSIS — K760 Fatty (change of) liver, not elsewhere classified: Secondary | ICD-10-CM | POA: Diagnosis not present

## 2023-12-07 DIAGNOSIS — R748 Abnormal levels of other serum enzymes: Secondary | ICD-10-CM | POA: Diagnosis not present

## 2023-12-07 DIAGNOSIS — R109 Unspecified abdominal pain: Secondary | ICD-10-CM | POA: Diagnosis not present

## 2023-12-07 MED ORDER — GADOBUTROL 1 MMOL/ML IV SOLN
10.0000 mL | Freq: Once | INTRAVENOUS | Status: AC | PRN
  Administered 2023-12-07: 10 mL via INTRAVENOUS

## 2023-12-10 ENCOUNTER — Other Ambulatory Visit: Payer: Self-pay

## 2023-12-10 ENCOUNTER — Ambulatory Visit: Payer: Self-pay | Admitting: Gastroenterology

## 2023-12-10 DIAGNOSIS — R748 Abnormal levels of other serum enzymes: Secondary | ICD-10-CM

## 2023-12-23 DIAGNOSIS — R5383 Other fatigue: Secondary | ICD-10-CM | POA: Diagnosis not present

## 2023-12-23 DIAGNOSIS — R739 Hyperglycemia, unspecified: Secondary | ICD-10-CM | POA: Diagnosis not present

## 2023-12-23 DIAGNOSIS — Z23 Encounter for immunization: Secondary | ICD-10-CM | POA: Diagnosis not present

## 2023-12-23 DIAGNOSIS — K219 Gastro-esophageal reflux disease without esophagitis: Secondary | ICD-10-CM | POA: Diagnosis not present

## 2023-12-23 DIAGNOSIS — E782 Mixed hyperlipidemia: Secondary | ICD-10-CM | POA: Diagnosis not present

## 2024-01-01 ENCOUNTER — Telehealth: Payer: Self-pay

## 2024-01-01 NOTE — Telephone Encounter (Signed)
-----   Message from [facility] M sent at 12/10/2023  1:39 PM EDT ----- Patient needs repeat LFT's for fatty liver. Orders entered in Epic. Contact patient and have her come in.

## 2024-01-01 NOTE — Telephone Encounter (Signed)
 Called and left detailed message for patient to go to the lab

## 2024-01-02 NOTE — Telephone Encounter (Addendum)
 Inbound call from patient requesting a call back in regards to her labs. Patient stated that she is going to go out of town and will not be back until the week of the October the 6 th. Please advise.

## 2024-01-02 NOTE — Telephone Encounter (Signed)
 Called and LM for patient that she can go to the lab before she leaves town OR if she is out of town she can come to the lab when she gets back

## 2024-01-14 ENCOUNTER — Ambulatory Visit: Payer: Self-pay | Admitting: Gastroenterology

## 2024-01-14 ENCOUNTER — Other Ambulatory Visit (INDEPENDENT_AMBULATORY_CARE_PROVIDER_SITE_OTHER)

## 2024-01-14 DIAGNOSIS — R748 Abnormal levels of other serum enzymes: Secondary | ICD-10-CM | POA: Diagnosis not present

## 2024-01-14 LAB — HEPATIC FUNCTION PANEL
ALT: 19 U/L (ref 0–35)
AST: 18 U/L (ref 0–37)
Albumin: 3.8 g/dL (ref 3.5–5.2)
Alkaline Phosphatase: 113 U/L (ref 39–117)
Bilirubin, Direct: 0.1 mg/dL (ref 0.0–0.3)
Total Bilirubin: 0.6 mg/dL (ref 0.2–1.2)
Total Protein: 6.9 g/dL (ref 6.0–8.3)

## 2024-03-02 ENCOUNTER — Other Ambulatory Visit

## 2024-03-02 ENCOUNTER — Encounter: Payer: Self-pay | Admitting: Gastroenterology

## 2024-03-02 ENCOUNTER — Ambulatory Visit (INDEPENDENT_AMBULATORY_CARE_PROVIDER_SITE_OTHER): Admitting: Gastroenterology

## 2024-03-02 VITALS — BP 138/80 | HR 76 | Ht 65.0 in | Wt 223.0 lb

## 2024-03-02 DIAGNOSIS — K760 Fatty (change of) liver, not elsewhere classified: Secondary | ICD-10-CM | POA: Diagnosis not present

## 2024-03-02 DIAGNOSIS — K580 Irritable bowel syndrome with diarrhea: Secondary | ICD-10-CM

## 2024-03-02 DIAGNOSIS — R110 Nausea: Secondary | ICD-10-CM

## 2024-03-02 MED ORDER — DICYCLOMINE HCL 10 MG PO CAPS: 10.0000 mg | ORAL_CAPSULE | Freq: Two times a day (BID) | ORAL | 5 refills | Status: AC

## 2024-03-02 MED ORDER — AMBULATORY NON FORMULARY MEDICATION
Status: AC
Start: 2024-03-02 — End: ?

## 2024-03-02 MED ORDER — IBGARD 90 MG PO CPCR: ORAL_CAPSULE | ORAL | Status: AC

## 2024-03-02 NOTE — Patient Instructions (Signed)
 Please go to the lab in the basement of our building to have lab work done as you leave today. Hit B for basement when you get on the elevator.  When the doors open the lab is on your left.  We will call you with the results. Thank you.  We have sent the following medications to your pharmacy for you to pick up at your convenience: Bentyl  - Take twice a day on a schedule  We have given you samples of the following medication to take: FODZYMES - take as directed IBgard - take as directed  Continue  Zofran  and Nexium   Please talk with your PCP about starting Elavil.  Please follow up in 6 months.  Thank you for entrusting me with your care and for choosing [name], Dr. Elspeth Naval    _______________________________________________________  If your blood pressure at your visit was 140/90 or greater, please contact your primary care physician to follow up on this.  _______________________________________________________  If you are age 51 or older, your body mass index should be between 23-30. Your Body mass index is 37.11 kg/m. If this is out of the aforementioned range listed, please consider follow up with your Primary Care Provider.  If you are age 8 or younger, your body mass index should be between 19-25. Your Body mass index is 37.11 kg/m. If this is out of the aformentioned range listed, please consider follow up with your Primary Care Provider.   ________________________________________________________  The [facility] providers would like to encourage you to use MYCHART to communicate with providers for non-urgent requests or questions.  Due to long hold times on the telephone, sending your provider a message by [facility] may be a faster and more efficient way to get a response.  Please allow 48 business hours for a response.  Please remember that this is for non-urgent requests.  _______________________________________________________  [facility] is using a team-based approach to care.  Your team is made up of your doctor and two to three APPS. Our APPS (Nurse Practitioners and Physician Assistants) work with your physician to ensure care continuity for you. They are fully qualified to address your health concerns and develop a treatment plan. They communicate directly with your gastroenterologist to care for you. Seeing the Advanced Practice Practitioners on your physician's team can help you by facilitating care more promptly, often allowing for earlier appointments, access to diagnostic testing, procedures, and other specialty referrals.

## 2024-03-02 NOTE — Progress Notes (Signed)
 HPI :  58 year old female here for follow-up visit to discuss bowel habits, abdominal pain, elevated liver enzymes, fatty liver.  Please see last visit from August 2024 for full details.  Recall she has had some symptoms of chronic loose stools with lower abdominal pain bothering her over time.  She has also had some chronic nausea with early satiety and some reflux symptoms.  She had an EGD and colonoscopy with me in October of last year to evaluate some of these issues.  No evidence of gastric outlet obstruction, Barrett's, PUD, colitis/microscopic colitis.  She takes narcotics chronically for back pain, thought she had a component of functional upper tract symptoms/gastroparesis and underlying IBS.  We did give her a trial of Colestid  given her history of cholecystectomy and chronic loose stools.  At the time of her colonoscopy she said it did not really help her too much so he stopped it.  She has been taking Bentyl  as needed which certainly does help her when she takes it.  She also is on IBgard and states that definitely helps her however it has been cost prohibitive and she has a hard time affording it.  At baseline she has anywhere from 2-5 bowel movements per day that can be loose.  She also has some lower abdominal cramping that is reliably relieved with a bowel movement.  No blood in her stools.  She has never tried TCA before.  She takes Lexapro  at baseline and also Seroquel  as needed for insomnia.  As for her upper tract symptoms she has been on Nexium  once daily which controls her reflux, and more recently Zofran  as needed which she states works pretty well.  I have recommended a trial of low-dose Reglan  as needed after her EGD.  She thinks she tried it but cannot really recall if it helped her or not.  She states overall she is doing a bit better in regards to the symptoms.  Recall she takes Norco twice daily for chronic back pain.  She has also been on Ozempic in recent months, she  thinks the past 6 months or so, and has lost perhaps around 15 pounds.  She states she think she is tolerating it so far again her upper tract symptoms are not really bother her too much.  Recall she is also had some fluctuating liver enzymes.  She had a mild alkaline phosphatase elevation anywhere from 130s to 190s, did have a transient transaminitis about 4 months ago which resolved.  She had lab workup for autoimmune hepatitis, AMA and an MRCP which showed no cause for her elevated alk phos.  Her recent liver enzymes last month were actually normal.  She denies any other new medication changes around this timeframe.  Body mass index is 38.    Colonoscopy March 2021 - Dr. Golda - . 4 small tubular adenomas removed.  Random biopsies from proximal and distal colon were negative for microscopic or collagenous colitis.     Celiac disease panel in January 2021 negative. Serum IgA was 485.    CT scan abdomen pelvis 08/12/22 -  Impression 1. No CT evidence of acute abdominal/pelvic process. 2. Hepatic steatosis. 3. Status post cholecystectomy and appendectomy. 4. Posterior spinal and interbody fusion at L4-S1.     EGD 01/15/2023: - Esophagogastric landmarks identified. - Normal esophagus otherwise. - Normal stomach. Biopsied. - Normal examined duodenum. Biopsied. Normal anatomy, no concerning pathology on this exam. Patient could have functional disorder in the setting of chronic narcotics use.  Colonoscopy 01/15/2023: High risk colon cancer surveillance: Personal history of colonic polyps - 4 adenomas removed 06/2019, also with chronic loose stools - trial of colestid  has not helped. IB gard has helped some of her lower abdominal symptoms. - The perianal and digital rectal examinations were normal. - A 3 mm polyp was found in the ascending colon. The polyp was flat. The polyp was removed with a cold biopsy forceps. Resection and retrieval were complete. - A diminutive polyp was found in the sigmoid  colon. The polyp was sessile. The polyp was removed with a cold biopsy forceps. Resection and retrieval were complete. - A few small-mouthed diverticula were found in the sigmoid colon. - Internal hemorrhoids were found during retroflexion. - The exam was otherwise without abnormality. Of note, right colon was tortous, ileal intubation not successful. - Biopsies for histology were taken with a cold forceps from the right colon, left colon and transverse colon for evaluation of microscopic colitis.   FINAL DIAGNOSIS       1. Surgical [P], duodenal :      - BENIGN SMALL BOWEL MUCOSA WITH NO SIGNIFICANT PATHOLOGIC CHANGES       2. Surgical [P], antrum and body :      - MILD REACTIVE GASTROPATHY.      - NEGATIVE FOR H. PYLORI ON H&E STAIN      - NO INTESTINAL METAPLASIA, DYSPLASIA, OR MALIGNANCY.       3. Surgical [P], colon, ascending and sigmoid, polyp (2) :      - TUBULAR ADENOMA.      - NO HIGH GRADE DYSPLASIA OR MALIGNANCY.      - POLYPOID FRAGMENT OF  BENIGN COLONIC MUCOSA WITH LYMPHOID AGGREGATE       4. Surgical [P], random colon :      - BENIGN COLONIC MUCOSA WITH NO SPECIFIC PATHOLOGIC CHANGES      - NEGATIVE FOR INCREASED INTRAEPITHELIAL LYMPHOCYTES OR THICKENED SUBEPITHELIAL      COLLAGEN TABLE      - NEGATIVE FOR DYSPLASIA OR MALIGNANCY   MRCP 11/2023: IMPRESSION: 1. No MR findings of the abdomen to explain pain. 2. Status post cholecystectomy. No biliary dilatation. 3. Hepatic steatosis.  Lab Results  Component Value Date   ALT 19 01/14/2024   AST 18 01/14/2024   ALKPHOS 113 01/14/2024   BILITOT 0.6 01/14/2024    Past Medical History:  Diagnosis Date   Anxiety    Arthritis    Asthma    Back pain    Cervicogenic headache 08/20/2016   Chronic headaches    Chronic pain    COPD (chronic obstructive pulmonary disease) (HCC)    Depression    Emphysema    Emphysema of lung (HCC)    Fatty liver    GERD (gastroesophageal reflux disease)    High cholesterol     Hypertension    Hypothyroidism    IBS (irritable bowel syndrome)    Joint pain    RLS (restless legs syndrome)    Thyroid  disease      Past Surgical History:  Procedure Laterality Date   ANTERIOR CERVICAL DECOMP/DISCECTOMY FUSION N/A 12/17/2018   Procedure: ANTERIOR CERVICAL DECOMPRESSION/DISCECTOMY FUSION CERVIAL FIVE THROUGH SIX;  Surgeon: Burnetta Aures, MD;  Location: [facility] OR;  Service: Orthopedics;  Laterality: N/A;  2.5 hrs   APPENDECTOMY     BACK SURGERY     x4   BIOPSY  06/12/2019   Procedure: BIOPSY;  Surgeon: Golda Claudis PENNER, MD;  Location: [facility];  Service: Endoscopy;;  random, sigmoid   BREAST REDUCTION SURGERY Bilateral 08/06/2019   Procedure: BILATERAL MAMMARY REDUCTION (BREAST) WITH LIPOSUCTION;  Surgeon: Lowery Estefana RAMAN, DO;  Location: [facility];  Service: Plastics;  Laterality: Bilateral;  3 hours   CHOLECYSTECTOMY  2010   COLONOSCOPY W/ POLYPECTOMY  07/2013   1 polyp removal   COLONOSCOPY WITH PROPOFOL  N/A 06/12/2019   Procedure: COLONOSCOPY WITH PROPOFOL ;  Surgeon: Golda Claudis PENNER, MD;  Location: [facility];  Service: Endoscopy;  Laterality: N/A;  825   CYST REMOVAL HAND  2015   EXCISION MASS UPPER EXTREMETIES Left 02/25/2017   Procedure: EXCISION MASS UPPER EXTREMETIES, LEFT PALM;  Surgeon: Murrell Drivers, MD;  Location: [facility];  Service: Orthopedics;  Laterality: Left;   POLYPECTOMY  06/12/2019   Procedure: POLYPECTOMY;  Surgeon: Golda Claudis PENNER, MD;  Location: [facility];  Service: Endoscopy;;  ascending;hepatic flexurex2;splenic flexure   SHOULDER SURGERY Right    cleaned up bone spurs   Family History  Problem Relation Age of Onset   Hypertension Mother    Depression Mother    Anxiety disorder Mother    COPD Mother    Hypertension Father    Heart disease Maternal Grandfather    Heart disease Paternal Grandmother    Colon cancer Neg Hx    Stomach cancer Neg Hx    Esophageal cancer Neg Hx    Social  History   Tobacco Use   Smoking status: Former    Current packs/day: 0.00    Types: Cigarettes    Quit date: 06/15/2011    Years since quitting: 12.7   Smokeless tobacco: Never  Vaping Use   Vaping status: Never Used  Substance Use Topics   Alcohol use: Yes    Comment: rare   Drug use: No   Current Outpatient Medications  Medication Sig Dispense Refill   albuterol  (PROVENTIL ) (2.5 MG/3ML) 0.083% nebulizer solution Take 2.5 mg by nebulization daily as needed.     ALPRAZolam  (XANAX ) 1 MG tablet Take 1 mg by mouth 4 (four) times daily as needed for anxiety.      aspirin  EC 81 MG tablet Take 1 tablet (81 mg total) by mouth daily. Swallow whole.     atorvastatin  (LIPITOR) 20 MG tablet Take 20 mg by mouth at bedtime.     baclofen (LIORESAL) 10 MG tablet Take 10 mg by mouth in the morning and at bedtime.     BREZTRI AEROSPHERE 160-9-4.8 MCG/ACT AERO inhaler Inhale 2 puffs into the lungs.     cyclobenzaprine (FLEXERIL) 10 MG tablet Take 10 mg by mouth at bedtime as needed.     dicyclomine  (BENTYL ) 10 MG capsule TAKE ONE CAPSULE BY MOUTH THREE TIMES DAILY AS NEEDED FOR SPASMS 90 capsule 2   escitalopram  (LEXAPRO ) 20 MG tablet Take 20 mg by mouth daily.     esomeprazole  (NEXIUM ) 40 MG capsule Take 1 capsule (40 mg total) by mouth daily before breakfast. 90 capsule 3   HYDROcodone -acetaminophen  (NORCO) 10-325 MG tablet Take 1 tablet by mouth every 6 (six) hours as needed (pain.).      levothyroxine  (SYNTHROID ) 112 MCG tablet Take 224 mcg by mouth daily.     losartan-hydrochlorothiazide (HYZAAR) 100-12.5 MG tablet Take 1 tablet by mouth daily.     meclizine (ANTIVERT) 25 MG tablet Take 25 mg by mouth 3 (three) times daily as needed.     metoCLOPramide  (REGLAN ) 5 MG tablet Take 1 tablet (5 mg total)  by mouth 3 (three) times daily as needed for nausea. Take before meals, trial for 1 month. 90 tablet 0   montelukast (SINGULAIR) 10 MG tablet Take 10 mg by mouth at bedtime.     ondansetron  (ZOFRAN ) 4  MG tablet Take 1 tablet (4 mg total) by mouth every 8 (eight) hours as needed for nausea or vomiting. 20 tablet 0   OZEMPIC, 1 MG/DOSE, 4 MG/3ML SOPN INJECT 1 MG SUBCUTANEOUSLY ONCE A WEEK.     PROAIR  HFA 108 (90 BASE) MCG/ACT inhaler Inhale 2 puffs into the lungs every 4 (four) hours as needed for wheezing or shortness of breath.      propranolol (INDERAL) 80 MG tablet Take 80 mg by mouth daily.     QUEtiapine  (SEROQUEL ) 300 MG tablet 1 tablet in the morning and 1.5 tablets at bedtime     rOPINIRole  (REQUIP ) 4 MG tablet Take 4 mg by mouth at bedtime.     SYMBICORT 160-4.5 MCG/ACT inhaler Inhale 2 puffs into the lungs. 1-2 times daily prn     traZODone (DESYREL) 100 MG tablet Take 100 mg by mouth at bedtime as needed.     No current facility-administered medications for this visit.   Allergies  Allergen Reactions   Pneumococcal Vaccines Itching and Other (See Comments)    Patient received Influenza and Pneumococcal vaccines at the same time-caused severe itching and needs medication to reverse reaction, this caused her to go into cardiac arrest at that time.     Amoxicillin Hives    Did it involve swelling of the face/tongue/throat, SOB, or low BP? Yes Did it involve sudden or severe rash/hives, skin peeling, or any reaction on the inside of your mouth or nose? Yes Did you need to seek medical attention at a hospital or doctor's office? Unknown When did it last happen? 6-7 years ago     If all above answers are "NO", may proceed with cephalosporin use.    Codeine Nausea And Vomiting   Hydromorphone  Hcl Nausea And Vomiting   Sulfonamide Derivatives Hives   Adhesive [Tape]     Causes red rash/hives   Morphine And Codeine Hives     Review of Systems: All systems reviewed and negative except where noted in HPI.    Physical Exam: BP 138/80   Pulse 76   Ht 5' 5 (1.651 m)   Wt 223 lb (101.2 kg)   LMP 12/15/2013 (Approximate)   BMI 37.11 kg/m  Constitutional:  Pleasant,well-developed, female in no acute distress. Neurological: Alert and oriented to person place and time. Psychiatric: Normal mood and affect. Behavior is normal.   ASSESSMENT: 58 y.o. female here for assessment of the following  1. Irritable bowel syndrome with diarrhea   2. Chronic nausea   3. Metabolic dysfunction-associated steatotic liver disease (MASLD)    Extensive workup as outlined for her bowel symptoms, abdominal cramping, chronic nausea.  Overall suspect functional bowel disorder in the setting of narcotics.  I think she has underlying IBS.  Discussed her course to date and options.  She has responded quite well to both Bentyl  and IBgard but does not take them routinely.  IBgard has been cost prohibitive.  Recommend she take Bentyl  twice daily every day to see if this can help be a preventative and minimize her symptoms.  I also gave her some free samples of IBgard to use as needed.  She does have some gas and bloating, also gave her samples of FODZYME supplement to see if that will  help as well.  We discussed long-term, she may benefit from a TCA for her chronic musculoskeletal pain, diarrhea, abdominal cramping, and insomnia.  She does take Lexapro  at baseline, I recommend she discuss adding Elavil to her regimen or switching Lexapro  to Elavil, with her primary care, and if she is able to do that I think that might help some of these more chronic symptoms.  Obviously minimizing narcotic use would be helpful however she been on a chronic dose of this for some time and requires it for her back pain.  Her upper tract symptoms are surprisingly better controlled since have last seen her despite adding Ozempic.  I did give her a trial of Reglan , she thinks it helped but cannot recall, however given symptoms are mild at this time, using Zofran  as needed and taking her Nexium , will continue that regimen for now.  Holding off on Reglan  unless really needed.  Discussed her elevated  liver enzymes with her, she has had an extensive evaluation which revealed only fatty liver so far, alk phos has been predominant enzyme elevated.  MRCP negative for biliary pathology.  She has been on Ozempic and lost weight and her liver enzymes normalized.  Isolated alk phos elevation a bit atypical for fatty liver.  She also has reported history of sarcoidosis, however as long as alk phos is normal we will just monitor for now.  We counseled her on fatty liver in general, risks for fibrosis over time.  Hopefully risks for that is low, no evidence of cirrhosis on imaging.  She is on Ozempic which we discussed can really be helpful here.  She should continue to work on weight loss and we will trend her liver enzymes over time.  PLAN: - take bentyl  scheduled BID - refilled - samples IB gard to use PRN - samples of FODZYME provided to use PRN - discuss with her PCP if she can try Elavil (also on Lexapro ) - perhaps she can switch from Lexapro  to Elavil - would help her insomnia, bowel spasm, diarrhea, etc - continue Zofran  PRN, holding off on Reglan  - continue nexium  PRN - counseled on fatty liver - continue Ozempic - lab hepatitis B surface antigen, hepatitis B surface antibody, hepatitis C antibody, hepatitis A total AB - vaccinate if needed - f/u 6 months or sooner with issues  I spent 30 minutes of time, including in depth chart review, face-to-face time with the patient, and documentation.   Marcey Naval, MD [facility]

## 2024-03-03 ENCOUNTER — Ambulatory Visit: Payer: Self-pay | Admitting: Gastroenterology

## 2024-03-03 LAB — HEPATITIS B SURFACE ANTIBODY,QUALITATIVE: Hep B S Ab: NONREACTIVE

## 2024-03-03 LAB — HEPATITIS A ANTIBODY, TOTAL: Hepatitis A AB,Total: NONREACTIVE

## 2024-03-03 LAB — HEPATITIS C ANTIBODY: Hepatitis C Ab: NONREACTIVE

## 2024-03-03 LAB — HEPATITIS B SURFACE ANTIGEN: Hepatitis B Surface Ag: NONREACTIVE

## 2024-03-03 NOTE — Telephone Encounter (Signed)
 PT returning call. Please advise.

## 2024-03-12 ENCOUNTER — Ambulatory Visit

## 2024-04-13 ENCOUNTER — Ambulatory Visit
